# Patient Record
Sex: Male | Born: 1945 | Hispanic: Yes | Marital: Single | Smoking: Never smoker
Health system: Southern US, Community
[De-identification: ages and names within clinical notes are randomized; demographics above are authoritative.]

## PROBLEM LIST (undated history)

## (undated) DIAGNOSIS — I10 Essential (primary) hypertension: Secondary | ICD-10-CM

## (undated) DIAGNOSIS — I671 Cerebral aneurysm, nonruptured: Secondary | ICD-10-CM

## (undated) DIAGNOSIS — E119 Type 2 diabetes mellitus without complications: Secondary | ICD-10-CM

## (undated) HISTORY — PX: CEREBRAL ANEURYSM REPAIR: SHX164

---

## 2015-09-22 ENCOUNTER — Observation Stay
Admit: 2015-09-22 | Discharge: 2015-09-22 | Disposition: A | Discharge status: Home / Self Care | Payer: Medicare Other | Attending: Internal Medicine | Admitting: Internal Medicine

## 2015-09-22 ENCOUNTER — Encounter: Payer: Self-pay | Admitting: Emergency Medicine

## 2015-09-22 ENCOUNTER — Observation Stay
Admission: EM | Admit: 2015-09-22 | Discharge: 2015-09-23 | Disposition: A | Discharge status: Home / Self Care | Payer: Medicare Other | Attending: Internal Medicine | Admitting: Internal Medicine

## 2015-09-22 ENCOUNTER — Emergency Department: Payer: Medicare Other

## 2015-09-22 DIAGNOSIS — I160 Hypertensive urgency: Secondary | ICD-10-CM

## 2015-09-22 DIAGNOSIS — I340 Nonrheumatic mitral (valve) insufficiency: Secondary | ICD-10-CM | POA: Insufficient documentation

## 2015-09-22 DIAGNOSIS — E1165 Type 2 diabetes mellitus with hyperglycemia: Secondary | ICD-10-CM | POA: Insufficient documentation

## 2015-09-22 DIAGNOSIS — I10 Essential (primary) hypertension: Principal | ICD-10-CM | POA: Insufficient documentation

## 2015-09-22 DIAGNOSIS — R778 Other specified abnormalities of plasma proteins: Secondary | ICD-10-CM | POA: Diagnosis present

## 2015-09-22 DIAGNOSIS — R7989 Other specified abnormal findings of blood chemistry: Secondary | ICD-10-CM

## 2015-09-22 DIAGNOSIS — H539 Unspecified visual disturbance: Secondary | ICD-10-CM

## 2015-09-22 DIAGNOSIS — R011 Cardiac murmur, unspecified: Secondary | ICD-10-CM | POA: Insufficient documentation

## 2015-09-22 DIAGNOSIS — I671 Cerebral aneurysm, nonruptured: Secondary | ICD-10-CM | POA: Insufficient documentation

## 2015-09-22 DIAGNOSIS — Z833 Family history of diabetes mellitus: Secondary | ICD-10-CM | POA: Insufficient documentation

## 2015-09-22 DIAGNOSIS — E119 Type 2 diabetes mellitus without complications: Secondary | ICD-10-CM

## 2015-09-22 DIAGNOSIS — R748 Abnormal levels of other serum enzymes: Secondary | ICD-10-CM | POA: Insufficient documentation

## 2015-09-22 DIAGNOSIS — Z9889 Other specified postprocedural states: Secondary | ICD-10-CM | POA: Insufficient documentation

## 2015-09-22 DIAGNOSIS — I739 Peripheral vascular disease, unspecified: Secondary | ICD-10-CM | POA: Insufficient documentation

## 2015-09-22 DIAGNOSIS — R51 Headache: Secondary | ICD-10-CM | POA: Insufficient documentation

## 2015-09-22 DIAGNOSIS — Z87891 Personal history of nicotine dependence: Secondary | ICD-10-CM | POA: Insufficient documentation

## 2015-09-22 DIAGNOSIS — R251 Tremor, unspecified: Secondary | ICD-10-CM | POA: Insufficient documentation

## 2015-09-22 DIAGNOSIS — R001 Bradycardia, unspecified: Secondary | ICD-10-CM | POA: Insufficient documentation

## 2015-09-22 HISTORY — DX: Cerebral aneurysm, nonruptured: I67.1

## 2015-09-22 LAB — CBC
HCT: 43.3 % (ref 40.0–52.0)
Hemoglobin: 14.7 g/dL (ref 13.0–18.0)
MCH: 29.1 pg (ref 26.0–34.0)
MCHC: 33.8 g/dL (ref 32.0–36.0)
MCV: 86.1 fL (ref 80.0–100.0)
PLATELETS: 182 10*3/uL (ref 150–440)
RBC: 5.04 MIL/uL (ref 4.40–5.90)
RDW: 13 % (ref 11.5–14.5)
WBC: 5.6 10*3/uL (ref 3.8–10.6)

## 2015-09-22 LAB — URINALYSIS COMPLETE WITH MICROSCOPIC (ARMC ONLY)
BILIRUBIN URINE: NEGATIVE
HGB URINE DIPSTICK: NEGATIVE
Ketones, ur: NEGATIVE mg/dL
LEUKOCYTES UA: NEGATIVE
Nitrite: NEGATIVE
Protein, ur: 30 mg/dL — AB
Specific Gravity, Urine: 1.026 (ref 1.005–1.030)
pH: 5 (ref 5.0–8.0)

## 2015-09-22 LAB — BASIC METABOLIC PANEL
ANION GAP: 8 (ref 5–15)
BUN: 17 mg/dL (ref 6–20)
CALCIUM: 10.1 mg/dL (ref 8.9–10.3)
CO2: 28 mmol/L (ref 22–32)
Chloride: 101 mmol/L (ref 101–111)
Creatinine, Ser: 0.85 mg/dL (ref 0.61–1.24)
GFR calc Af Amer: >60 mL/min (ref >60)
GLUCOSE: 352 mg/dL — AB (ref 65–99)
POTASSIUM: 4.4 mmol/L (ref 3.5–5.1)
SODIUM: 137 mmol/L (ref 135–145)

## 2015-09-22 LAB — GLUCOSE, CAPILLARY
Glucose-Capillary: 420 mg/dL — ABNORMAL HIGH (ref 65–99)
Glucose-Capillary: 214 mg/dL — ABNORMAL HIGH (ref 65–99)

## 2015-09-22 LAB — TROPONIN I
TROPONIN I: 0.05 ng/mL — AB (ref <0.031)
Troponin I: <0.03 ng/mL (ref <0.031)

## 2015-09-22 LAB — SEDIMENTATION RATE: SED RATE: 25 mm/h — AB (ref 0–20)

## 2015-09-22 MED ORDER — AMLODIPINE BESYLATE 10 MG PO TABS
10.0000 mg | ORAL_TABLET | Freq: Every day | ORAL | Status: DC
Start: 2015-09-22 — End: 2015-09-23
  Administered 2015-09-22 – 2015-09-23 (×2): 10 mg via ORAL
  Filled 2015-09-22 (×2): qty 1

## 2015-09-22 MED ORDER — ONDANSETRON HCL 4 MG PO TABS: 4.0000 mg | ORAL_TABLET | Freq: Four times a day (QID) | ORAL | Status: DC | PRN

## 2015-09-22 MED ORDER — INSULIN ASPART 100 UNIT/ML ~~LOC~~ SOLN
15.0000 [IU] | Freq: Once | SUBCUTANEOUS | Status: AC
  Administered 2015-09-22: 15 [IU] via SUBCUTANEOUS
  Filled 2015-09-22: qty 15

## 2015-09-22 MED ORDER — SODIUM CHLORIDE 0.9% FLUSH
3.0000 mL | Freq: Two times a day (BID) | INTRAVENOUS | Status: DC
  Administered 2015-09-23: 3 mL via INTRAVENOUS

## 2015-09-22 MED ORDER — INSULIN ASPART 100 UNIT/ML ~~LOC~~ SOLN: 0.0000 [IU] | Freq: Every day | SUBCUTANEOUS | Status: DC

## 2015-09-22 MED ORDER — INSULIN GLARGINE 100 UNIT/ML ~~LOC~~ SOLN
10.0000 [IU] | Freq: Every day | SUBCUTANEOUS | Status: DC
  Administered 2015-09-22: 10 [IU] via SUBCUTANEOUS
  Filled 2015-09-22 (×2): qty 0.1

## 2015-09-22 MED ORDER — ALBUTEROL SULFATE (2.5 MG/3ML) 0.083% IN NEBU: 2.5000 mg | INHALATION_SOLUTION | RESPIRATORY_TRACT | Status: DC | PRN

## 2015-09-22 MED ORDER — ONDANSETRON HCL 4 MG/2ML IJ SOLN: 4.0000 mg | Freq: Four times a day (QID) | INTRAMUSCULAR | Status: DC | PRN

## 2015-09-22 MED ORDER — ACETAMINOPHEN 650 MG RE SUPP: 650.0000 mg | Freq: Four times a day (QID) | RECTAL | Status: DC | PRN

## 2015-09-22 MED ORDER — ENOXAPARIN SODIUM 40 MG/0.4ML ~~LOC~~ SOLN
40.0000 mg | SUBCUTANEOUS | Status: DC
  Administered 2015-09-22: 40 mg via SUBCUTANEOUS
  Filled 2015-09-22: qty 0.4

## 2015-09-22 MED ORDER — ATORVASTATIN CALCIUM 10 MG PO TABS
40.0000 mg | ORAL_TABLET | Freq: Every day | ORAL | Status: DC
  Administered 2015-09-22: 40 mg via ORAL
  Filled 2015-09-22: qty 4

## 2015-09-22 MED ORDER — SODIUM CHLORIDE 0.9 % IV SOLN: 250.0000 mL | INTRAVENOUS | Status: DC | PRN

## 2015-09-22 MED ORDER — HYDRALAZINE HCL 20 MG/ML IJ SOLN: 10.0000 mg | Freq: Four times a day (QID) | INTRAMUSCULAR | Status: DC | PRN

## 2015-09-22 MED ORDER — INSULIN ASPART 100 UNIT/ML ~~LOC~~ SOLN
0.0000 [IU] | Freq: Three times a day (TID) | SUBCUTANEOUS | Status: DC
  Administered 2015-09-23: 5 [IU] via SUBCUTANEOUS
  Filled 2015-09-22: qty 5

## 2015-09-22 MED ORDER — SODIUM CHLORIDE 0.9% FLUSH: 3.0000 mL | INTRAVENOUS | Status: DC | PRN

## 2015-09-22 MED ORDER — SODIUM CHLORIDE 0.9% FLUSH
3.0000 mL | Freq: Two times a day (BID) | INTRAVENOUS | Status: DC
  Administered 2015-09-22: 3 mL via INTRAVENOUS

## 2015-09-22 MED ORDER — ASPIRIN EC 325 MG PO TBEC
325.0000 mg | DELAYED_RELEASE_TABLET | Freq: Every day | ORAL | Status: DC
  Administered 2015-09-22 – 2015-09-23 (×2): 325 mg via ORAL
  Filled 2015-09-22 (×2): qty 1

## 2015-09-22 MED ORDER — INSULIN ASPART 100 UNIT/ML ~~LOC~~ SOLN
0.0000 [IU] | Freq: Every day | SUBCUTANEOUS | Status: DC
  Administered 2015-09-22: 2 [IU] via SUBCUTANEOUS
  Filled 2015-09-22: qty 2

## 2015-09-22 MED ORDER — ACETAMINOPHEN 325 MG PO TABS
650.0000 mg | ORAL_TABLET | Freq: Four times a day (QID) | ORAL | Status: DC | PRN
Start: 2015-09-22 — End: 2015-09-23

## 2015-09-22 MED ORDER — HYDRALAZINE HCL 20 MG/ML IJ SOLN
10.0000 mg | Freq: Once | INTRAMUSCULAR | Status: AC
  Administered 2015-09-22: 10 mg via INTRAVENOUS
  Filled 2015-09-22: qty 1

## 2015-09-22 MED ORDER — INSULIN ASPART 100 UNIT/ML ~~LOC~~ SOLN: 0.0000 [IU] | Freq: Three times a day (TID) | SUBCUTANEOUS | Status: DC

## 2015-09-22 NOTE — Progress Notes (Signed)
Patient fs 420 mg/dl on admission to the floor , DR Imogene Burn was made aware and order novolog 15 unit sq to be given ,  Also sbp slightly high , schedule meds given as order ,will continue to monitor

## 2015-09-22 NOTE — ED Notes (Signed)
Patient states he does not take any medications for BP and has not been to a physician in 20 years.

## 2015-09-22 NOTE — ED Notes (Signed)
States has seen light flashes x 1 week. States at onset had headache x 1 day. Denies history of migraines. 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

## 2015-09-22 NOTE — H&P (Addendum)
[facility] [place] at [facility]   PATIENT NAME: Kyle Ritter    MR#:  [id number]  DATE OF BIRTH:  July 20, 1946  DATE OF ADMISSION:  09/22/2015  PRIMARY CARE PHYSICIAN: No PCP Per Patient   REQUESTING/REFERRING PHYSICIAN: Rockne Menghini, MD  CHIEF COMPLAINT:   Chief Complaint  Patient presents with  . Eye Problem   headache and "blanking light" a few days.  HISTORY OF PRESENT ILLNESS:  Conrad Zajkowski  is a 70 y.o. male with a known history of brain aneurysm. Patient came to the ED due to above chief complaint. He has had frontal headacheand "blanking light" a few days, but he denies any dizziness, numbness or weakness. He denies any slurred speech or dysphagia or incontinence. He was found to have high blood pressure at 232/78,  treated with hydralazine IV 1 dose. His symptoms has much improved after treatment of hypertension. CAT scan of the head didn't show any hemorrhage. But he has elevated troponin at 0.05. Blood sugar is high at 352. Dr. Sharma Covert has concerns for discharging the patient. She insists that the patient needs to be admitted for further evaluation. The patient hasn't seen any physician for more than 20 years and he is not taking any medication.  PAST MEDICAL HISTORY:   Past Medical History  Diagnosis Date  . Brain aneurysm     PAST SURGICAL HISTORY:   Past Surgical History  Procedure Laterality Date  . Cerebral aneurysm repair      SOCIAL HISTORY:   Social History  Substance Use Topics  . Smoking status: Former Games developer  . Smokeless tobacco: Not on file  . Alcohol Use: No     Comment: quit alcohol 4 years ago.    FAMILY HISTORY:   Family History  Problem Relation Age of Onset  . Diabetes Mother   . Diabetes Sister     DRUG ALLERGIES:  No Known Allergies  REVIEW OF SYSTEMS:  CONSTITUTIONAL: No fever, fatigue or weakness.  EYES: No blurred or double vision. But has  "blanking light". EARS, NOSE, AND THROAT: No  tinnitus or ear pain.  RESPIRATORY: No cough, shortness of breath, wheezing or hemoptysis.  CARDIOVASCULAR: No chest pain, orthopnea, edema.  GASTROINTESTINAL: No nausea, vomiting, diarrhea or abdominal pain.  GENITOURINARY: No dysuria, hematuria.  ENDOCRINE: No polyuria, nocturia,  HEMATOLOGY: No anemia, easy bruising or bleeding SKIN: No rash or lesion. MUSCULOSKELETAL: No joint pain or arthritis.   NEUROLOGIC: No tingling, numbness, weakness.  PSYCHIATRY: No anxiety or depression.   MEDICATIONS AT HOME:   Prior to Admission medications   Not on File      VITAL SIGNS:  Blood pressure 171/80, pulse 57, temperature 98.4 F (36.9 C), temperature source Oral, resp. rate 19, height  (1.803 m), weight 85.276 kg (188 lb), SpO2 97 %.  PHYSICAL EXAMINATION:  GENERAL:  70 y.o.-year-old patient lying in the bed with no acute distress.  EYES: Pupils equal, round, reactive to light and accommodation. No scleral icterus. Extraocular muscles intact.  HEENT: Head atraumatic, normocephalic. Oropharynx and nasopharynx clear.  NECK:  Supple, no jugular venous distention. No thyroid enlargement, no tenderness.  LUNGS: Normal breath sounds bilaterally, no wheezing, rales,rhonchi or crepitation. No use of accessory muscles of respiration.  CARDIOVASCULAR: S1, S2 normal. Mild systolic murmurs, no rubs, or gallops.  ABDOMEN: Soft, nontender, nondistended. Bowel sounds present. No organomegaly or mass.  EXTREMITIES: No pedal edema, cyanosis, or clubbing.  NEUROLOGIC: Cranial nerves II through XII are intact. Muscle strength  5/5 in all extremities. Sensation intact. Gait not checked.  PSYCHIATRIC: The patient is alert and oriented x 3.  SKIN: No obvious rash, lesion, or ulcer.   LABORATORY PANEL:   CBC  Recent Labs Lab 09/22/15 1145  WBC 5.6  HGB 14.7  HCT 43.3  PLT 182    ------------------------------------------------------------------------------------------------------------------  Chemistries   Recent Labs Lab 09/22/15 1145  NA 137  K 4.4  CL 101  CO2 28  GLUCOSE 352*  BUN 17  CREATININE 0.85  CALCIUM 10.1   ------------------------------------------------------------------------------------------------------------------  Cardiac Enzymes  Recent Labs Lab 09/22/15 1145  TROPONINI 0.05*   ------------------------------------------------------------------------------------------------------------------  RADIOLOGY:  Dg Chest 2 View  09/22/2015  CLINICAL DATA:  Seeing light flashes for 1 week, onset of headache 1 day ago, history intracranial aneurysm post repair 20 yrs ago, former smoker EXAM: CHEST  2 VIEW COMPARISON:  None FINDINGS: Mild enlargement of cardiac silhouette. Mediastinal contours and pulmonary vascularity normal. Lungs clear. No pleural effusion or pneumothorax. Bones unremarkable. Question thoracic ankylosis. IMPRESSION: Enlargement of cardiac silhouette without acute infiltrate. Electronically Signed   By: Ulyses Southward M.D.   On: 09/22/2015 13:13   Ct Head Wo Contrast  09/22/2015  CLINICAL DATA:  Visual alteration.  Tremor.  Hypertension. EXAM: CT HEAD WITHOUT CONTRAST TECHNIQUE: Contiguous axial images were obtained from the base of the skull through the vertex without intravenous contrast. COMPARISON:  None. FINDINGS: There is mild diffuse atrophy. There is a cavum septum pellucidum, an anatomic variant. There is no intracranial mass hemorrhage, extra-axial fluid collection, or midline shift. There is evidence of a prior infarct at the gray - white junction of the mid left frontal lobe. There is patchy small vessel disease in the centra semiovale bilaterally. Elsewhere gray-white compartments are normal. No acute infarct is evident. There is postoperative change in the left frontal and temporal bone regions. Bony calvarium  otherwise intact. Mastoid air cells are clear. No intraorbital lesions are identified. 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 No hemorrhage or acute infarct. Postoperative change left frontal and temporal bones. Electronically Signed   By: Bretta Bang III M.D.   On: 09/22/2015 13:09    EKG:   Orders placed or performed during the hospital encounter of 09/22/15  . ED EKG  . ED EKG  . EKG 12-Lead  . EKG 12-Lead  . EKG 12-Lead  . EKG 12-Lead  . EKG 12-Lead  . EKG 12-Lead    IMPRESSION AND PLAN:   Malignant hypertension I will start hydralazine IV when necessary and Norvasc by mouth, adjust hypertension medication depending on patient's blood pressure.  Elevated troponin. Possible due to malignant hypertension. Follow-up troponin level, start aspirin and Lipitor, check lipid panel and TSH. Follow-up echocardiogram and cardiology consult.  Bradycardia. Telemetry monitor. New diagnosis of diabetes with hyperglycemia.  Check hemoglobin A1c , start Lantus and sliding scale.    All the records are reviewed and case discussed with ED provider. Management plans discussed with the patient, family and they are in agreement.  CODE STATUS: Full code  TOTAL TIME TAKING CARE OF THIS PATIENT: 51 minutes.    Shaune Pollack M.D on 09/22/2015 at 3:41 PM  Between 7am to 6pm - Pager - [phone]  After 6pm go to [email or web address] - password [email or web address] [facility]  [facility]  Office  [phone]  CC: Primary care physician; No PCP Per Patient

## 2015-09-22 NOTE — ED Notes (Signed)
Disregard "ED EKG Completed" and "Complete ED EKG Completed" tasks that were documented at 1251.  Documented in error, wrong patient.  Patient at CT, will complete when patient returns.

## 2015-09-22 NOTE — Progress Notes (Signed)
*  PRELIMINARY RESULTS* Echocardiogram 2D Echocardiogram has been performed.  Kyle Ritter 09/22/2015, 7:31 PM

## 2015-09-22 NOTE — ED Provider Notes (Signed)
[facility] Emergency Department Provider Note  ____________________________________________  Time seen: Approximately 12:37 PM  I have reviewed the triage vital signs and the nursing notes.   HISTORY  Chief Complaint Eye Problem    HPI Kyle Ritter is a 70 y.o. male w/ remote cerebral aneurysm repair presenting for headache and "blinking lights."  Patient reports that for 1 day last week he had a mild right frontal headache without any associated symptoms including visual changes, numbness tingling or weakness, fever or neck stiffness, nausea or vomiting. It resolved spontaneously. Several days later he began to develop "blinking lights" regardless of which direction he would look in. Sometimes this would last as long as 2 hours, but more recently he episodes have been brief. Overall, both of his symptoms have been improving. He denies any trauma, chronic medications, difficulty walking, changes in mental status, or any other recent illness.   Past Medical History  Diagnosis Date  . Brain aneurysm     There are no active problems to display for this patient.   Past Surgical History  Procedure Laterality Date  . Cerebral aneurysm repair      No current outpatient prescriptions on file.  Allergies Review of patient's allergies indicates no known allergies.  No family history on file.  Social History Social History  Substance Use Topics  . Smoking status: Former Games developer  . Smokeless tobacco: None  . Alcohol Use: No    Review of Systems Constitutional: No fever/chills. No lightheadedness or syncope. Eyes: Positive "blinking lights"without blurred vision or double vision. ENT: No sore throat. Cardiovascular: Denies chest pain, palpitations. Respiratory: Denies shortness of breath.  No cough. Gastrointestinal: No abdominal pain.  No nausea, no vomiting.  No diarrhea.  No constipation. Genitourinary: Negative for dysuria. Musculoskeletal:  Negative for back pain. Skin: Negative for rash. Neurological: Positive for mild headache, now resolved. No numbness tingling or weakness. No changes in speech. No changes in mental status. No changes in gait. Positive resting tremor most prominent in the head which is chronic.  10-point ROS otherwise negative.  ____________________________________________   PHYSICAL EXAM:  VITAL SIGNS: ED Triage Vitals  Enc Vitals Group     BP 09/22/15 1118 232/78 mmHg     Pulse Rate 09/22/15 1118 60     Resp 09/22/15 1118 18     Temp 09/22/15 1118 98.4 F (36.9 C)     Temp Source 09/22/15 1118 Oral     SpO2 09/22/15 1118 96 %     Weight 09/22/15 1118 188 lb (85.276 kg)     Height 09/22/15 1118 5\' 11"  (1.803 m)     Head Cir --      Peak Flow --      Pain Score --      Pain Loc --      Pain Edu? --      Excl. in GC? --     Constitutional: Alert and oriented. Well appearing and in no acute distress. Answer question appropriately. Eyes: Conjunctivae are normal.  EOMI. PERRLA. Head: Atraumatic. Nose: No congestion/rhinnorhea. Mouth/Throat: Mucous membranes are moist.  Neck: No stridor.  Supple.  No JVD. Cardiovascular: Normal rate, regular rhythm. Positive holosystolic murmur without rubs or gallops.  Respiratory: Normal respiratory effort.  No retractions. Lungs CTAB.  No wheezes, rales or ronchi. Gastrointestinal: Soft and nontender. No distention. No peritoneal signs. Musculoskeletal: No LE edema.  Neurologic:  Neurologic: Alert and oriented 3. Speech is clear. Naming and repetition are intact. Face and  smile symmetric. EOMI and PERRLA. Tongue is midline. No pronator drift. 5 out of 5 grip, biceps, triceps, hip flexors, plantar flexion and dorsiflexion. Normal sensation to light touch in the bilateral upper and lower extremities, and face. Normal heel-to-shin. Resting tremor that is most prominent in the head but also present in both arms. Skin:  Skin is warm, dry and intact. No rash  noted. Psychiatric: Mood and affect are normal. Speech and behavior are normal.  Normal judgement.  ____________________________________________   LABS (all labs ordered are listed, but only abnormal results are displayed)  Labs Reviewed  BASIC METABOLIC PANEL - Abnormal; Notable for the following:    Glucose, Bld 352 (*)    All other components within normal limits  TROPONIN I - Abnormal; Notable for the following:    Troponin I 0.05 (*)    All other components within normal limits  URINALYSIS COMPLETEWITH MICROSCOPIC ([facility] ONLY) - Abnormal; Notable for the following:    Color, Urine YELLOW (*)    APPearance CLEAR (*)    Glucose, UA >500 (*)    Protein, ur 30 (*)    Bacteria, UA RARE (*)    Squamous Epithelial / LPF 0-5 (*)    All other components within normal limits  SEDIMENTATION RATE - Abnormal; Notable for the following:    Sed Rate 25 (*)    All other components within normal limits  CBC   ____________________________________________  EKG  ED ECG REPORT I, Rockne Menghini, the attending physician, personally viewed and interpreted this ECG.   Date: 09/22/2015  EKG Time: 1306  Rate: 53  Rhythm: sinus bradycardia  Axis: Leftward  Intervals:none  ST&T Change: No ST elevation. Nonspecific T-wave inversion in V1. No ischemic changes.  ____________________________________________  RADIOLOGY  Dg Chest 2 View  09/22/2015  CLINICAL DATA:  Seeing light flashes for 1 week, onset of headache 1 day ago, history intracranial aneurysm post repair 20 yrs ago, former smoker EXAM: CHEST  2 VIEW COMPARISON:  None FINDINGS: Mild enlargement of cardiac silhouette. Mediastinal contours and pulmonary vascularity normal. Lungs clear. No pleural effusion or pneumothorax. Bones unremarkable. Question thoracic ankylosis. IMPRESSION: Enlargement of cardiac silhouette without acute infiltrate. Electronically Signed   By: Ulyses Southward M.D.   On: 09/22/2015 13:13   Ct Head Wo  Contrast  09/22/2015  CLINICAL DATA:  Visual alteration.  Tremor.  Hypertension. EXAM: CT HEAD WITHOUT CONTRAST TECHNIQUE: Contiguous axial images were obtained from the base of the skull through the vertex without intravenous contrast. COMPARISON:  None. FINDINGS: There is mild diffuse atrophy. There is a cavum septum pellucidum, an anatomic variant. There is no intracranial mass hemorrhage, extra-axial fluid collection, or midline shift. There is evidence of a prior infarct at the gray - white junction of the mid left frontal lobe. There is patchy small vessel disease in the centra semiovale bilaterally. Elsewhere gray-white compartments are normal. No acute infarct is evident. There is postoperative change in the left frontal and temporal bone regions. Bony calvarium otherwise intact. Mastoid air cells are clear. No intraorbital lesions are identified. 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 No hemorrhage or acute infarct. Postoperative change left frontal and temporal bones. Electronically Signed   By: Bretta Bang III M.D.   On: 09/22/2015 13:09    ____________________________________________   PROCEDURES  Procedure(s) performed: None  Critical Care performed: No ____________________________________________   INITIAL IMPRESSION / ASSESSMENT AND PLAN / ED COURSE  Pertinent labs &  imaging results that were available during my care of the patient were reviewed by me and considered in my medical decision making (see chart for details).  70 y.o. male with a history of cerebral aneurysm presenting with headache and "blinking lights", both of which are improving. The patient is markedly hypertensive with a blood pressure in the 220s over 100s on my exam. He also has a bradycardia in the high 50s. He has no focal neurologic findings other than what is likely a chronic resting tremor or possible parkinsonian tremor. His  headache and visual changes may be symptomatic hypertension or hypertensive urgency. I will also evaluate him for CVA and head bleed. It is much less likely that these symptoms are due to subarachnoid hemorrhage or repeat problem with his cerebral aneurysm given the minimal pain in his head and lack of neurologic symptoms. Also get a sedimentation rate to evaluate for temporal arteritis. The patient will likely require admission to the hospital for further evaluation and treatment.  ----------------------------------------- 3:06 PM on 09/22/2015 -----------------------------------------  The patient does have hyperglycemia without DKA. He does not carry a diagnosis of diabetes, but this is likely new diagnosis for him. His hypertension has improved with hydralazine and blood pressure is now 150s over 70s. His CT scan does not show any acute strokes but he does have evidence of prior stroke. His chest x-ray does show an enlarged cardiac silhouette, and this in conjunction with the holosystolic murmur that I heard on my exam will need further evaluation with echocardiogram. He does have a mildly elevated troponin and this will need to be trended. The patient's sedimentation rate is only 25, which is unlikely a sign of temporal arteritis. I would normally treat him with a sedimentation rate starting at 50. Plan admission.  ____________________________________________  FINAL CLINICAL IMPRESSION(S) / ED DIAGNOSES  Final diagnoses:  New onset type 2 diabetes mellitus (HCC)  Elevated troponin  Visual changes  Hypertensive urgency, malignant  Acute nonintractable headache, unspecified headache type      NEW MEDICATIONS STARTED DURING THIS VISIT:  New Prescriptions   No medications on file     Rockne Menghini, MD 09/22/15 [phone]

## 2015-09-23 LAB — HEMOGLOBIN A1C: Hgb A1c MFr Bld: 10.6 % — ABNORMAL HIGH (ref 4.0–6.0)

## 2015-09-23 LAB — TSH: TSH: 1.986 u[IU]/mL (ref 0.350–4.500)

## 2015-09-23 LAB — CBC
HCT: 39.8 % — ABNORMAL LOW (ref 40.0–52.0)
HEMOGLOBIN: 13.7 g/dL (ref 13.0–18.0)
MCH: 29.8 pg (ref 26.0–34.0)
MCHC: 34.4 g/dL (ref 32.0–36.0)
MCV: 86.7 fL (ref 80.0–100.0)
PLATELETS: 177 10*3/uL (ref 150–440)
RBC: 4.59 MIL/uL (ref 4.40–5.90)
RDW: 12.8 % (ref 11.5–14.5)
WBC: 7.2 10*3/uL (ref 3.8–10.6)

## 2015-09-23 LAB — GLUCOSE, CAPILLARY: GLUCOSE-CAPILLARY: 247 mg/dL — AB (ref 65–99)

## 2015-09-23 LAB — LIPID PANEL
CHOL/HDL RATIO: 4.8 ratio
Cholesterol: 209 mg/dL — ABNORMAL HIGH (ref 0–200)
HDL: 44 mg/dL (ref >40)
LDL CALC: 133 mg/dL — AB (ref 0–99)
TRIGLYCERIDES: 162 mg/dL — AB (ref <150)
VLDL: 32 mg/dL (ref 0–40)

## 2015-09-23 LAB — TROPONIN I
TROPONIN I: 0.03 ng/mL (ref <0.031)
Troponin I: 0.03 ng/mL (ref <0.031)

## 2015-09-23 MED ORDER — METFORMIN HCL 500 MG PO TABS
500.0000 mg | ORAL_TABLET | Freq: Two times a day (BID) | ORAL | Status: DC
Start: 2015-09-23 — End: 2019-11-27

## 2015-09-23 MED ORDER — ASPIRIN 81 MG PO TBEC: 81.0000 mg | DELAYED_RELEASE_TABLET | Freq: Every day | ORAL | Status: DC

## 2015-09-23 MED ORDER — ATORVASTATIN CALCIUM 40 MG PO TABS: 40.0000 mg | ORAL_TABLET | Freq: Every day | ORAL | Status: DC

## 2015-09-23 MED ORDER — AMLODIPINE BESYLATE 10 MG PO TABS: 10.0000 mg | ORAL_TABLET | Freq: Every day | ORAL | Status: DC

## 2015-09-23 MED ORDER — LIVING WELL WITH DIABETES BOOK
Freq: Once | Status: AC
  Administered 2015-09-23: 09:00:00
  Filled 2015-09-23: qty 1

## 2015-09-23 NOTE — Discharge Instructions (Signed)

## 2015-09-23 NOTE — Progress Notes (Signed)
Discharge instructions explained to pt and pts family member/ verbalized an understanding/ iv and tele removed/ transported off unit via wheelchair.  

## 2015-09-23 NOTE — Care Management (Signed)
Patient placed in observation for elevated blood pressure and elevated troponin.  Patient has not seen a physcian in over 20 years.  Provided list of local physicians in area accepting new patients.  Sister s followed at [facility] and will make patient appointment.  [facility] is also to be followed at [facility] as [facility] outpatient for new onsert DM

## 2015-09-23 NOTE — Progress Notes (Signed)
Kyle Ritter is a 70 y.o. male  [id number]  Primary Cardiologist: Adrian Blackwater Reason for Consultation: Headache  HPI: This is a 70 year old white male with a past medical history of berry aneurysm of the brain apparently was having some headaches and blinking lights on Wednesday last week and presented yesterday to the hospital because somebody from his family wanted him to be checked out. He denies any chest pain or shortness of breath. However his blood sugar was elevated and one of the troponin was 0.05 the other 2 troponins were negative. He denies any chest pain or shortness of breath.    Review of Systems: No chest pain orthopnea PND palpitation or leg swelling and CT of the brain was unremarkable   Past Medical History  Diagnosis Date  . Brain aneurysm     No prescriptions prior to admission     . amLODipine  10 mg Oral Daily  . aspirin EC  325 mg Oral Daily  . atorvastatin  40 mg Oral q1800  . enoxaparin (LOVENOX) injection  40 mg Subcutaneous Q24H  . insulin aspart  0-15 Units Subcutaneous TID WC  . insulin aspart  0-5 Units Subcutaneous QHS  . insulin glargine  10 Units Subcutaneous QHS  . living well with diabetes book   Does not apply Once  . sodium chloride flush  3 mL Intravenous Q12H  . sodium chloride flush  3 mL Intravenous Q12H    Infusions:    No Known Allergies  Social History   Social History  . Marital Status: Single    Spouse Name: N/A  . Number of Children: N/A  . Years of Education: N/A   Occupational History  . Not on file.   Social History Main Topics  . Smoking status: Former Games developer  . Smokeless tobacco: Not on file  . Alcohol Use: No     Comment: quit alcohol 4 years ago.  . Drug Use: Not on file  . Sexual Activity: Not on file   Other Topics Concern  . Not on file   Social History Narrative  . No narrative on file    Family History  Problem Relation Age of Onset  . Diabetes Mother   . Diabetes Sister      PHYSICAL EXAM: Filed Vitals:   09/23/15 0411 09/23/15 0725  BP: 132/67 143/70  Pulse: 52 56  Temp: 97.8 F (36.6 C)   Resp: 17 18     Intake/Output Summary (Last 24 hours) at 09/23/15 0910 Last data filed at 09/23/15 0651  Gross per 24 hour  Intake      0 ml  Output      0 ml  Net      0 ml    General:  Well appearing. No respiratory difficulty HEENT: normal Neck: supple. no JVD. Carotids 2+ bilat; no bruits. No lymphadenopathy or thryomegaly appreciated. Cor: PMI nondisplaced. Regular rate & rhythm. No rubs, gallops or murmurs. Lungs: clear Abdomen: soft, nontender, nondistended. No hepatosplenomegaly. No bruits or masses. Good bowel sounds. Extremities: no cyanosis, clubbing, rash, edema Neuro: alert & oriented x 3, cranial nerves grossly intact. moves all 4 extremities w/o difficulty. Affect pleasant.  ECG: Sinus rhythm with sinus bradycardia and nonspecific ST-T changes  Results for orders placed or performed during the hospital encounter of 09/22/15 (from the past 24 hour(s))  CBC     Status: None   Collection Time: 09/22/15 11:45 AM  Result Value Ref Range   WBC 5.6  3.8 - 10.6 K/uL   RBC 5.04 4.40 - 5.90 MIL/uL   Hemoglobin 14.7 13.0 - 18.0 g/dL   HCT 16.1 09.6 - 04.5 %   MCV 86.1 80.0 - 100.0 fL   MCH 29.1 26.0 - 34.0 pg   MCHC 33.8 32.0 - 36.0 g/dL   RDW 40.9 81.1 - 91.4 %   Platelets 182 150 - 440 K/uL  Basic metabolic panel     Status: Abnormal   Collection Time: 09/22/15 11:45 AM  Result Value Ref Range   Sodium 137 135 - 145 mmol/L   Potassium 4.4 3.5 - 5.1 mmol/L   Chloride 101 101 - 111 mmol/L   CO2 28 22 - 32 mmol/L   Glucose, Bld 352 (H) 65 - 99 mg/dL   BUN 17 6 - 20 mg/dL   Creatinine, Ser 7.82 0.61 - 1.24 mg/dL   Calcium 95.6 8.9 - 21.3 mg/dL   GFR calc non Af Amer >60 >60 mL/min   GFR calc Af Amer >60 >60 mL/min   Anion gap 8 5 - 15  Troponin I     Status: Abnormal   Collection Time: 09/22/15 11:45 AM  Result Value Ref Range    Troponin I 0.05 (H) <0.031 ng/mL  Sedimentation rate     Status: Abnormal   Collection Time: 09/22/15 11:45 AM  Result Value Ref Range   Sed Rate 25 (H) 0 - 20 mm/hr  Urinalysis complete, with microscopic ([facility] only)     Status: Abnormal   Collection Time: 09/22/15  1:14 PM  Result Value Ref Range   Color, Urine YELLOW (A) YELLOW   APPearance CLEAR (A) CLEAR   Glucose, UA >500 (A) NEGATIVE mg/dL   Bilirubin Urine NEGATIVE NEGATIVE   Ketones, ur NEGATIVE NEGATIVE mg/dL   Specific Gravity, Urine 1.026 1.005 - 1.030   Hgb urine dipstick NEGATIVE NEGATIVE   pH 5.0 5.0 - 8.0   Protein, ur 30 (A) NEGATIVE mg/dL   Nitrite NEGATIVE NEGATIVE   Leukocytes, UA NEGATIVE NEGATIVE   RBC / HPF 0-5 0 - 5 RBC/hpf   WBC, UA 0-5 0 - 5 WBC/hpf   Bacteria, UA RARE (A) NONE SEEN   Squamous Epithelial / LPF 0-5 (A) NONE SEEN   Mucous PRESENT   Troponin I     Status: None   Collection Time: 09/22/15  5:38 PM  Result Value Ref Range   Troponin I <0.03 <0.031 ng/mL  Glucose, capillary     Status: Abnormal   Collection Time: 09/22/15  5:42 PM  Result Value Ref Range   Glucose-Capillary 420 (H) 65 - 99 mg/dL   Comment 1 Notify RN   Glucose, capillary     Status: Abnormal   Collection Time: 09/22/15  8:53 PM  Result Value Ref Range   Glucose-Capillary 214 (H) 65 - 99 mg/dL  Troponin I     Status: None   Collection Time: 09/22/15 11:28 PM  Result Value Ref Range   Troponin I 0.03 <0.031 ng/mL  CBC     Status: Abnormal   Collection Time: 09/23/15  4:31 AM  Result Value Ref Range   WBC 7.2 3.8 - 10.6 K/uL   RBC 4.59 4.40 - 5.90 MIL/uL   Hemoglobin 13.7 13.0 - 18.0 g/dL   HCT 08.6 (L) 57.8 - 46.9 %   MCV 86.7 80.0 - 100.0 fL   MCH 29.8 26.0 - 34.0 pg   MCHC 34.4 32.0 - 36.0 g/dL   RDW 62.9 52.8 - 41.3 %  Platelets 177 150 - 440 K/uL  TSH     Status: None   Collection Time: 09/23/15  4:31 AM  Result Value Ref Range   TSH 1.986 0.350 - 4.500 uIU/mL  Troponin I     Status: None   Collection  Time: 09/23/15  4:31 AM  Result Value Ref Range   Troponin I 0.03 <0.031 ng/mL  Lipid panel     Status: Abnormal   Collection Time: 09/23/15  4:31 AM  Result Value Ref Range   Cholesterol 209 (H) 0 - 200 mg/dL   Triglycerides 782 (H) <150 mg/dL   HDL 44 >95 mg/dL   Total CHOL/HDL Ratio 4.8 RATIO   VLDL 32 0 - 40 mg/dL   LDL Cholesterol 621 (H) 0 - 99 mg/dL  Glucose, capillary     Status: Abnormal   Collection Time: 09/23/15  7:28 AM  Result Value Ref Range   Glucose-Capillary 247 (H) 65 - 99 mg/dL   Comment 1 Notify RN    Dg Chest 2 View  09/22/2015  CLINICAL DATA:  Seeing light flashes for 1 week, onset of headache 1 day ago, history intracranial aneurysm post repair 20 yrs ago, former smoker EXAM: CHEST  2 VIEW COMPARISON:  None FINDINGS: Mild enlargement of cardiac silhouette. Mediastinal contours and pulmonary vascularity normal. Lungs clear. No pleural effusion or pneumothorax. Bones unremarkable. Question thoracic ankylosis. IMPRESSION: Enlargement of cardiac silhouette without acute infiltrate. Electronically Signed   By: Ulyses Southward M.D.   On: 09/22/2015 13:13   Ct Head Wo Contrast  09/22/2015  CLINICAL DATA:  Visual alteration.  Tremor.  Hypertension. EXAM: CT HEAD WITHOUT CONTRAST TECHNIQUE: Contiguous axial images were obtained from the base of the skull through the vertex without intravenous contrast. COMPARISON:  None. FINDINGS: There is mild diffuse atrophy. There is a cavum septum pellucidum, an anatomic variant. There is no intracranial mass hemorrhage, extra-axial fluid collection, or midline shift. There is evidence of a prior infarct at the gray - white junction of the mid left frontal lobe. There is patchy small vessel disease in the centra semiovale bilaterally. Elsewhere gray-white compartments are normal. No acute infarct is evident. There is postoperative change in the left frontal and temporal bone regions. Bony calvarium otherwise intact. Mastoid air cells are clear.  No intraorbital lesions are identified. 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 No hemorrhage or acute infarct. Postoperative change left frontal and temporal bones. Electronically Signed   By: Bretta Bang III M.D.   On: 09/22/2015 13:09     ASSESSMENT AND PLAN: No chest pain with 1 out of 3 troponins being mildly elevated and no significant EKG changes. Patient can go home with follow-up in the office Thursday at 10:00.  Shirleyann Montero A

## 2015-09-23 NOTE — Progress Notes (Signed)
Inpatient Diabetes Program Recommendations  AACE/ADA: New Consensus Statement on Inpatient Glycemic Control (2015)  Target Ranges:  Prepandial:   less than 140 mg/dL      Peak postprandial:   less than 180 mg/dL (1-2 hours)      Critically ill patients:  140 - 180 mg/dL  Results for DATRON, BRAKEBILL (MRN [id number]) as of 09/23/2015 08:50  Ref. Range 09/22/2015 17:42 09/22/2015 20:53 09/23/2015 07:28  Glucose-Capillary Latest Ref Range: 65-99 mg/dL 409 (H) 811 (H) 914 (H)   Review of Glycemic Control  Diabetes history: NO; new DM dx Outpatient Diabetes medications: NA Current orders for Inpatient glycemic control: Lantus 10 units QHS, Novolog 0-15 units TID with meals, Novolog 0-5 units QHS  Inpatient Diabetes Program Recommendations: Insulin - Basal: Please consider increasing Lantus to 15 units QHS. HgbA1C: A1C in process. Discharge plan: MD, please indicate in note as to what the discharge plan will be for DM control (oral DM meds versus insulin) so that patient can be educated during hospitalization.  NOTE: Patient is being newly diagnosed with diabetes. Noted in H&P that patient has not seen a doctor in over 20 years. Ordered: Living Well with Diabetes, diabetes education by bedside RN, RD consult, and CM consult for assistance with setting up follow up/helping patient to establish PCP.  Thanks, Orlando Penner, RN, MSN, CDE Diabetes Coordinator Inpatient Diabetes Program [phone] (Team Pager from 8am to 5pm) [phone] ([facility] office) [phone] [facility] office) [phone] [facility] office)

## 2015-09-25 NOTE — Discharge Summary (Signed)
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 Brain aneurysm     HOSPITAL COURSE:  70 y.o. male with a known history of brain aneurysm admitted due to headache and "blanking light" a few days. He has had frontal headacheand "blanking light" a few days, but he denied any dizziness, numbness or weakness. He was found to have high blood pressure at 232/78 on admission. His symptoms has much improved after treatment of hypertension. CAT scan of the head didn't show any hemorrhage. But he has elevated troponin at 0.05. Blood sugar is high at 352. Please see Dr Nicky Pugh dictated H & P for further details.  Cardio c/s was obtained with Dr Welton Flakes. He was ruled out with serial troponins. He recommended outpt f/up and possible stress test. Patient's symptoms were resolved. He was feeling much better and was D/C home in stable condition. He was agreeable with d/c plans.  DISCHARGE CONDITIONS:   STABLE  CONSULTS OBTAINED:     DRUG ALLERGIES:  No Known Allergies  DISCHARGE MEDICATIONS:   Discharge Medication List as of 09/23/2015 11:47 AM    START taking these medications   Details  amLODipine (NORVASC) 10 MG tablet Take 1 tablet (10 mg total) by mouth daily., Starting 09/23/2015, Until Discontinued, Normal    aspirin EC 81 MG EC tablet Take 1 tablet (81 mg total) by mouth daily., Starting 09/23/2015, Until Discontinued, Normal     atorvastatin (LIPITOR) 40 MG tablet Take 1 tablet (40 mg total) by mouth daily at 6 PM., Starting 09/23/2015, Until Discontinued, Normal    metFORMIN (GLUCOPHAGE) 500 MG tablet Take 1 tablet (500 mg total) by mouth 2 (two) times daily with a meal., Starting 09/23/2015, Until Discontinued, Normal         DISCHARGE INSTRUCTIONS:    DIET:  Cardiac diet  DISCHARGE CONDITION:  Good  ACTIVITY:  Activity as tolerated  OXYGEN:  Home Oxygen: No.   Oxygen Delivery: room air  DISCHARGE LOCATION:  home   If you experience worsening of your admission symptoms, develop shortness of breath, life threatening emergency, suicidal or homicidal thoughts you must seek medical attention immediately by calling 911 or calling your MD immediately  if symptoms less severe.  You Must read complete instructions/literature along with all the possible adverse reactions/side effects for all the Medicines you take and that have been prescribed to you. Take any new Medicines after you have completely understood and accpet all the possible adverse reactions/side effects.   Please note  You were cared for by a hospitalist during your hospital stay. If you have any questions about your discharge medications or the care you received while you were in the hospital after you are discharged, you can call the unit and asked to speak with the hospitalist on call if the hospitalist that took care of you is not available. Once you are discharged, your primary care physician will handle any further medical issues. Please note that  NO REFILLS for any discharge medications will be authorized once you are discharged, as it is imperative that you return to your primary care physician (or establish a relationship with a primary care physician if you do not have one) for your aftercare needs so that they can reassess your need for medications and monitor your lab values.    On the day of Discharge:  VITAL SIGNS:  Blood  pressure 143/70, pulse 56, temperature 97.8 F (36.6 C), temperature source Oral, resp. rate 18, height  (1.803 m), weight 85.276 kg (188 lb), SpO2 97 %.  PHYSICAL EXAMINATION:  GENERAL:  70 y.o.-year-old patient lying in the bed with no acute distress.  EYES: Pupils equal, round, reactive to light and accommodation. No scleral icterus. Extraocular muscles intact.  HEENT: Head atraumatic, normocephalic. Oropharynx and nasopharynx clear.  NECK:  Supple, no jugular venous distention. No thyroid enlargement, no tenderness.  LUNGS: Normal breath sounds bilaterally, no wheezing, rales,rhonchi or crepitation. No use of accessory muscles of respiration.  CARDIOVASCULAR: S1, S2 normal. No murmurs, rubs, or gallops.  ABDOMEN: Soft, non-tender, non-distended. Bowel sounds present. No organomegaly or mass.  EXTREMITIES: No pedal edema, cyanosis, or clubbing.  NEUROLOGIC: Cranial nerves II through XII are intact. Muscle strength 5/5 in all extremities. Sensation intact. Gait not checked.  PSYCHIATRIC: The patient is alert and oriented x 3.  SKIN: No obvious rash, lesion, or ulcer.  DATA REVIEW:   CBC  Recent Labs Lab 09/23/15 0431  WBC 7.2  HGB 13.7  HCT 39.8*  PLT 177    Chemistries   Recent Labs Lab 09/22/15 1145  NA 137  K 4.4  CL 101  CO2 28  GLUCOSE 352*  BUN 17  CREATININE 0.85  CALCIUM 10.1    Cardiac Enzymes  Recent Labs Lab 09/23/15 0431  TROPONINI 0.03    Microbiology Results  No results found for this or any previous visit.  RADIOLOGY:  No results found.   Management plans discussed with the patient, family and they are in agreement.  CODE STATUS:  Code Status History    Date Active Date Inactive Code Status Order ID Comments User Context   09/22/2015  5:05 PM 09/23/2015  3:06 PM Full Code [id number]  Shaune Pollack, MD Inpatient     Follow-up Information    Follow up with [facility], MD. Schedule an appointment as soon as possible for a visit on  09/25/2015.   Specialty:  Cardiology   Why:  @ 10 am    Contact information:   [address] [place] [address] [phone]       Follow up with Bluford Main AHMED, MD. Schedule an appointment as soon as possible for a visit in 1 week.   Specialty:  Internal Medicine   Why:  [facility] Discharge F/UP   Contact information:   [address] [place] [address] [phone]        TOTAL TIME TAKING CARE OF THIS PATIENT: 40 minutes.    [facility], Reford Olliff M.D on 09/25/2015 at 4:33 PM  Between 7am to 6pm - Pager - [phone]  After 6pm go to [email or web address] - password [email or web address] [facility]  [place] [facility]  Office  [phone]  CC: Primary care physician; Luna Fuse, MD Laurier Nancy, MD  Note: This dictation was prepared with Dragon dictation along with smaller phrase technology. Any transcriptional errors that result from this process are unintentional.

## 2019-10-19 ENCOUNTER — Ambulatory Visit: Payer: Self-pay

## 2019-10-29 ENCOUNTER — Ambulatory Visit: Payer: Self-pay

## 2019-10-31 ENCOUNTER — Emergency Department: Payer: Medicare Other

## 2019-10-31 ENCOUNTER — Other Ambulatory Visit: Payer: Self-pay

## 2019-10-31 ENCOUNTER — Inpatient Hospital Stay
Admission: EM | Admit: 2019-10-31 | Discharge: 2019-11-11 | DRG: 871 | Disposition: A | Discharge status: Home Health | Payer: Medicare Other | Attending: Internal Medicine | Admitting: Internal Medicine

## 2019-10-31 DIAGNOSIS — R7881 Bacteremia: Secondary | ICD-10-CM | POA: Diagnosis not present

## 2019-10-31 DIAGNOSIS — Z79899 Other long term (current) drug therapy: Secondary | ICD-10-CM | POA: Diagnosis not present

## 2019-10-31 DIAGNOSIS — K82A2 Perforation of gallbladder in cholecystitis: Secondary | ICD-10-CM | POA: Diagnosis not present

## 2019-10-31 DIAGNOSIS — K750 Abscess of liver: Secondary | ICD-10-CM | POA: Diagnosis present

## 2019-10-31 DIAGNOSIS — Z87891 Personal history of nicotine dependence: Secondary | ICD-10-CM

## 2019-10-31 DIAGNOSIS — Z7984 Long term (current) use of oral hypoglycemic drugs: Secondary | ICD-10-CM

## 2019-10-31 DIAGNOSIS — R6520 Severe sepsis without septic shock: Secondary | ICD-10-CM | POA: Diagnosis not present

## 2019-10-31 DIAGNOSIS — E1165 Type 2 diabetes mellitus with hyperglycemia: Secondary | ICD-10-CM | POA: Diagnosis present

## 2019-10-31 DIAGNOSIS — Z7982 Long term (current) use of aspirin: Secondary | ICD-10-CM

## 2019-10-31 DIAGNOSIS — Z833 Family history of diabetes mellitus: Secondary | ICD-10-CM

## 2019-10-31 DIAGNOSIS — Z20822 Contact with and (suspected) exposure to covid-19: Secondary | ICD-10-CM | POA: Diagnosis present

## 2019-10-31 DIAGNOSIS — D649 Anemia, unspecified: Secondary | ICD-10-CM | POA: Diagnosis not present

## 2019-10-31 DIAGNOSIS — A4181 Sepsis due to Enterococcus: Secondary | ICD-10-CM | POA: Diagnosis present

## 2019-10-31 DIAGNOSIS — A419 Sepsis, unspecified organism: Secondary | ICD-10-CM | POA: Diagnosis not present

## 2019-10-31 DIAGNOSIS — N179 Acute kidney failure, unspecified: Secondary | ICD-10-CM | POA: Diagnosis present

## 2019-10-31 DIAGNOSIS — B955 Unspecified streptococcus as the cause of diseases classified elsewhere: Secondary | ICD-10-CM | POA: Diagnosis not present

## 2019-10-31 DIAGNOSIS — E8809 Other disorders of plasma-protein metabolism, not elsewhere classified: Secondary | ICD-10-CM | POA: Diagnosis present

## 2019-10-31 DIAGNOSIS — L0291 Cutaneous abscess, unspecified: Secondary | ICD-10-CM

## 2019-10-31 DIAGNOSIS — E860 Dehydration: Secondary | ICD-10-CM | POA: Diagnosis present

## 2019-10-31 DIAGNOSIS — A408 Other streptococcal sepsis: Secondary | ICD-10-CM | POA: Diagnosis present

## 2019-10-31 DIAGNOSIS — E871 Hypo-osmolality and hyponatremia: Secondary | ICD-10-CM | POA: Diagnosis present

## 2019-10-31 DIAGNOSIS — D539 Nutritional anemia, unspecified: Secondary | ICD-10-CM | POA: Diagnosis present

## 2019-10-31 DIAGNOSIS — B954 Other streptococcus as the cause of diseases classified elsewhere: Secondary | ICD-10-CM | POA: Diagnosis not present

## 2019-10-31 DIAGNOSIS — R627 Adult failure to thrive: Secondary | ICD-10-CM | POA: Diagnosis present

## 2019-10-31 DIAGNOSIS — Z6824 Body mass index (BMI) 24.0-24.9, adult: Secondary | ICD-10-CM | POA: Diagnosis not present

## 2019-10-31 DIAGNOSIS — K8001 Calculus of gallbladder with acute cholecystitis with obstruction: Secondary | ICD-10-CM

## 2019-10-31 DIAGNOSIS — K810 Acute cholecystitis: Secondary | ICD-10-CM

## 2019-10-31 DIAGNOSIS — E119 Type 2 diabetes mellitus without complications: Secondary | ICD-10-CM | POA: Diagnosis not present

## 2019-10-31 DIAGNOSIS — R531 Weakness: Secondary | ICD-10-CM | POA: Diagnosis present

## 2019-10-31 DIAGNOSIS — Z87820 Personal history of traumatic brain injury: Secondary | ICD-10-CM | POA: Diagnosis not present

## 2019-10-31 DIAGNOSIS — D6489 Other specified anemias: Secondary | ICD-10-CM | POA: Diagnosis present

## 2019-10-31 DIAGNOSIS — Z8679 Personal history of other diseases of the circulatory system: Secondary | ICD-10-CM

## 2019-10-31 DIAGNOSIS — I10 Essential (primary) hypertension: Secondary | ICD-10-CM | POA: Diagnosis present

## 2019-10-31 DIAGNOSIS — A409 Streptococcal sepsis, unspecified: Secondary | ICD-10-CM | POA: Diagnosis not present

## 2019-10-31 DIAGNOSIS — K8020 Calculus of gallbladder without cholecystitis without obstruction: Secondary | ICD-10-CM

## 2019-10-31 DIAGNOSIS — B952 Enterococcus as the cause of diseases classified elsewhere: Secondary | ICD-10-CM | POA: Diagnosis not present

## 2019-10-31 DIAGNOSIS — K812 Acute cholecystitis with chronic cholecystitis: Secondary | ICD-10-CM

## 2019-10-31 DIAGNOSIS — K8012 Calculus of gallbladder with acute and chronic cholecystitis without obstruction: Secondary | ICD-10-CM | POA: Diagnosis present

## 2019-10-31 DIAGNOSIS — E46 Unspecified protein-calorie malnutrition: Secondary | ICD-10-CM | POA: Diagnosis present

## 2019-10-31 DIAGNOSIS — Z794 Long term (current) use of insulin: Secondary | ICD-10-CM | POA: Diagnosis not present

## 2019-10-31 DIAGNOSIS — R7989 Other specified abnormal findings of blood chemistry: Secondary | ICD-10-CM

## 2019-10-31 DIAGNOSIS — Z9049 Acquired absence of other specified parts of digestive tract: Secondary | ICD-10-CM | POA: Diagnosis not present

## 2019-10-31 DIAGNOSIS — K811 Chronic cholecystitis: Secondary | ICD-10-CM

## 2019-10-31 DIAGNOSIS — D72829 Elevated white blood cell count, unspecified: Secondary | ICD-10-CM | POA: Diagnosis not present

## 2019-10-31 DIAGNOSIS — K8010 Calculus of gallbladder with chronic cholecystitis without obstruction: Secondary | ICD-10-CM | POA: Diagnosis not present

## 2019-10-31 DIAGNOSIS — Z978 Presence of other specified devices: Secondary | ICD-10-CM | POA: Diagnosis not present

## 2019-10-31 HISTORY — DX: Type 2 diabetes mellitus without complications: E11.9

## 2019-10-31 HISTORY — DX: Essential (primary) hypertension: I10

## 2019-10-31 LAB — URINALYSIS, COMPLETE (UACMP) WITH MICROSCOPIC
Bacteria, UA: NONE SEEN
Bilirubin Urine: NEGATIVE
Glucose, UA: >=500 mg/dL — AB
Hgb urine dipstick: NEGATIVE
Ketones, ur: 5 mg/dL — AB
Leukocytes,Ua: NEGATIVE
Nitrite: NEGATIVE
Protein, ur: 100 mg/dL — AB
Specific Gravity, Urine: 1.018 (ref 1.005–1.030)
pH: 7 (ref 5.0–8.0)

## 2019-10-31 LAB — COMPREHENSIVE METABOLIC PANEL
ALT: 146 U/L — ABNORMAL HIGH (ref 0–44)
AST: 123 U/L — ABNORMAL HIGH (ref 15–41)
Albumin: 2.7 g/dL — ABNORMAL LOW (ref 3.5–5.0)
Alkaline Phosphatase: 242 U/L — ABNORMAL HIGH (ref 38–126)
Anion gap: 11 (ref 5–15)
BUN: 28 mg/dL — ABNORMAL HIGH (ref 8–23)
CO2: 24 mmol/L (ref 22–32)
Calcium: 9.1 mg/dL (ref 8.9–10.3)
Chloride: 91 mmol/L — ABNORMAL LOW (ref 98–111)
Creatinine, Ser: 1.46 mg/dL — ABNORMAL HIGH (ref 0.61–1.24)
GFR calc Af Amer: 54 mL/min — ABNORMAL LOW (ref >60)
GFR calc non Af Amer: 46 mL/min — ABNORMAL LOW (ref >60)
Glucose, Bld: 509 mg/dL (ref 70–99)
Potassium: 4.7 mmol/L (ref 3.5–5.1)
Sodium: 126 mmol/L — ABNORMAL LOW (ref 135–145)
Total Bilirubin: 1 mg/dL (ref 0.3–1.2)
Total Protein: 7.6 g/dL (ref 6.5–8.1)

## 2019-10-31 LAB — PROCALCITONIN: Procalcitonin: 1.82 ng/mL

## 2019-10-31 LAB — CBC WITH DIFFERENTIAL/PLATELET
Abs Immature Granulocytes: 0.13 10*3/uL — ABNORMAL HIGH (ref 0.00–0.07)
Basophils Absolute: 0 10*3/uL (ref 0.0–0.1)
Basophils Relative: 0 %
Eosinophils Absolute: 0 10*3/uL (ref 0.0–0.5)
Eosinophils Relative: 0 %
HCT: 30.9 % — ABNORMAL LOW (ref 39.0–52.0)
Hemoglobin: 10.3 g/dL — ABNORMAL LOW (ref 13.0–17.0)
Immature Granulocytes: 1 %
Lymphocytes Relative: 3 %
Lymphs Abs: 0.4 10*3/uL — ABNORMAL LOW (ref 0.7–4.0)
MCH: 28.8 pg (ref 26.0–34.0)
MCHC: 33.3 g/dL (ref 30.0–36.0)
MCV: 86.3 fL (ref 80.0–100.0)
Monocytes Absolute: 1.3 10*3/uL — ABNORMAL HIGH (ref 0.1–1.0)
Monocytes Relative: 9 %
Neutro Abs: 12.5 10*3/uL — ABNORMAL HIGH (ref 1.7–7.7)
Neutrophils Relative %: 87 %
Platelets: 297 10*3/uL (ref 150–400)
RBC: 3.58 MIL/uL — ABNORMAL LOW (ref 4.22–5.81)
RDW: 12.7 % (ref 11.5–15.5)
WBC: 14.5 10*3/uL — ABNORMAL HIGH (ref 4.0–10.5)
nRBC: 0 % (ref 0.0–0.2)

## 2019-10-31 LAB — POC SARS CORONAVIRUS 2 AG: SARS Coronavirus 2 Ag: NEGATIVE

## 2019-10-31 LAB — LACTIC ACID, PLASMA: Lactic Acid, Venous: 1.6 mmol/L (ref 0.5–1.9)

## 2019-10-31 MED ORDER — VANCOMYCIN HCL 1250 MG/250ML IV SOLN: 1250.0000 mg | INTRAVENOUS | Status: DC

## 2019-10-31 MED ORDER — INSULIN ASPART 100 UNIT/ML ~~LOC~~ SOLN
8.0000 [IU] | Freq: Once | SUBCUTANEOUS | Status: AC
  Administered 2019-10-31: 8 [IU] via INTRAVENOUS
  Filled 2019-10-31: qty 1

## 2019-10-31 MED ORDER — METRONIDAZOLE IN NACL 5-0.79 MG/ML-% IV SOLN
500.0000 mg | Freq: Once | INTRAVENOUS | Status: AC
  Administered 2019-10-31: 500 mg via INTRAVENOUS
  Filled 2019-10-31: qty 100

## 2019-10-31 MED ORDER — METRONIDAZOLE IN NACL 5-0.79 MG/ML-% IV SOLN
500.0000 mg | Freq: Three times a day (TID) | INTRAVENOUS | Status: DC
  Administered 2019-11-01: 500 mg via INTRAVENOUS
  Filled 2019-10-31: qty 100

## 2019-10-31 MED ORDER — SODIUM CHLORIDE 0.9 % IV BOLUS
1000.0000 mL | Freq: Once | INTRAVENOUS | Status: AC
  Administered 2019-10-31: 1000 mL via INTRAVENOUS

## 2019-10-31 MED ORDER — SODIUM CHLORIDE 0.9 % IV SOLN
2.0000 g | Freq: Two times a day (BID) | INTRAVENOUS | Status: DC
  Administered 2019-11-01: 2 g via INTRAVENOUS
  Filled 2019-10-31: qty 2

## 2019-10-31 MED ORDER — SODIUM CHLORIDE 0.9 % IV SOLN
2.0000 g | Freq: Once | INTRAVENOUS | Status: AC
  Administered 2019-10-31: 2 g via INTRAVENOUS
  Filled 2019-10-31: qty 2

## 2019-10-31 MED ORDER — ACETAMINOPHEN 500 MG PO TABS
1000.0000 mg | ORAL_TABLET | Freq: Once | ORAL | Status: AC
  Administered 2019-10-31: 1000 mg via ORAL
  Filled 2019-10-31: qty 2

## 2019-10-31 MED ORDER — VANCOMYCIN HCL IN DEXTROSE 1-5 GM/200ML-% IV SOLN
1000.0000 mg | Freq: Once | INTRAVENOUS | Status: AC
  Administered 2019-10-31: 1000 mg via INTRAVENOUS
  Filled 2019-10-31: qty 200

## 2019-10-31 MED ORDER — IOHEXOL 300 MG/ML  SOLN
100.0000 mL | Freq: Once | INTRAMUSCULAR | Status: AC | PRN
  Administered 2019-10-31: 100 mL via INTRAVENOUS

## 2019-10-31 MED ORDER — VANCOMYCIN HCL IN DEXTROSE 1-5 GM/200ML-% IV SOLN
1000.0000 mg | Freq: Once | INTRAVENOUS | Status: AC
  Administered 2019-11-01: 1000 mg via INTRAVENOUS
  Filled 2019-10-31: qty 200

## 2019-10-31 MED ORDER — LACTATED RINGERS IV SOLN: INTRAVENOUS | Status: DC

## 2019-10-31 NOTE — Progress Notes (Signed)
74 yo DM male with presentation of fever and N/V, with h/o RUQ pain.  Has remarkably distended GB, with likely hepatic abscess, ?perf - though clearly not a free perforation as I have noted on my review of his CT imaging.   Advised admission for IV Abx, with anticipated perc drain to be placed by IR tomorrow, along with full surgical consultation 11/01/2019.

## 2019-10-31 NOTE — ED Triage Notes (Addendum)
Pt here from home via [facility]. EMS called code sepsis.  Per EMS pt has had increasing weakness and decreased PO intake for the last approximately four weeks. Pt's sister reports it has gotten to the point that he is too weak to get to the bathroom. Pt lives alone but sister has been visiting frequently for the past two weeks.   EMS VS- temp 102.8, cbg 533, HR 98-100, O2 94% room air.  Pt has hx of DM and HTN. Possible Parkinson's.

## 2019-10-31 NOTE — ED Notes (Signed)
Pt placed in clean brief and sheets changed at this time by this RN and Bennetta Laos.

## 2019-10-31 NOTE — Consult Note (Signed)
PHARMACY -  BRIEF ANTIBIOTIC NOTE   Pharmacy has received consult(s) for Cefepime and Vancomycin from an ED provider.  The patient's profile has been reviewed for ht/wt/allergies/indication/available labs.    One time order(s) placed for Cefepime 2gx1 dose and Vancomycin 1g x1   Further antibiotics/pharmacy consults should be ordered by admitting physician if indicated.                       Thank you, Katha Cabal 10/31/2019  7:36 PM

## 2019-10-31 NOTE — H&P (Signed)
Chief Complaint: I have generalized weakness with near falls at home. HPI: Kyle Ritter is an 74 y.o. male is a pleasant Hispanic male with medical history significant for obesity, type 2 diabetes mellitus, history of brain aneurysm.  He presented to the emergency room this evening upon insistence of cyst on account of generalized weakness with near falls at home.  Patient was also reported to have been febrile at home raising concern for possible infection has EMS being called.  As per patient, he was tested for Covid a couple of days ago, which came back negative.  Due to persistence of his symptoms, patient was brought in.  He denies any right upper quadrant abdominal pain, admits to poor oral intake but denies any nausea or vomiting.  Denies any yellowish discoloration of eyes or skin.  Work-up in the ED revealed evidence of leukocytosis and fever.  Abdominal CT scan confirmed findings suggestive of acute on chronic cholecystitis with an initial impression of possible hepatic abscess.  General surgery was consulted and did review imaging studies with radiology.  Recommendations was for possible cholecystostomy tube.  Patient was initiated on treatment with IV antibiotics and IV fluids as per sepsis protocol.  Past Medical History:  Diagnosis Date  . Brain aneurysm   . Diabetes mellitus without complication (HCC)   . Hypertension     Past Surgical History:  Procedure Laterality Date  . CEREBRAL ANEURYSM REPAIR      Family History  Problem Relation Age of Onset  . Diabetes Mother   . Diabetes Sister    Social History:  reports that he has quit smoking. He has never used smokeless tobacco. He reports that he does not drink alcohol or use drugs.  Allergies: No Known Allergies  (Not in a hospital admission)   Results for orders placed or performed during the hospital encounter of 10/31/19 (from the past 48 hour(s))  Lactic acid, plasma     Status: None   Collection Time: 10/31/19  6:30  PM  Result Value Ref Range   Lactic Acid, Venous 1.6 0.5 - 1.9 mmol/L    Comment: Performed at [facility], [address], [place], [place] [address]  Comprehensive metabolic panel     Status: Abnormal   Collection Time: 10/31/19  6:30 PM  Result Value Ref Range   Sodium 126 (L) 135 - 145 mmol/L   Potassium 4.7 3.5 - 5.1 mmol/L   Chloride 91 (L) 98 - 111 mmol/L   CO2 24 22 - 32 mmol/L   Glucose, Bld 509 (HH) 70 - 99 mg/dL    Comment: CRITICAL RESULT CALLED TO, READ BACK BY AND VERIFIED WITH KATIE FERGUSON 10/31/19 1913 KLW Glucose reference range applies only to samples taken after fasting for at least 8 hours.    BUN 28 (H) 8 - 23 mg/dL   Creatinine, Ser 5.64 (H) 0.61 - 1.24 mg/dL   Calcium 9.1 8.9 - 33.2 mg/dL   Total Protein 7.6 6.5 - 8.1 g/dL   Albumin 2.7 (L) 3.5 - 5.0 g/dL   AST 951 (H) 15 - 41 U/L   ALT 146 (H) 0 - 44 U/L   Alkaline Phosphatase 242 (H) 38 - 126 U/L   Total Bilirubin 1.0 0.3 - 1.2 mg/dL   GFR calc non Af Amer 46 (L) >60 mL/min   GFR calc Af Amer 54 (L) >60 mL/min   Anion gap 11 5 - 15    Comment: Performed at [facility], [address] [place], [place] [address]  CBC WITH DIFFERENTIAL     Status: Abnormal   Collection Time: 10/31/19  6:30 PM  Result Value Ref Range   WBC 14.5 (H) 4.0 - 10.5 K/uL   RBC 3.58 (L) 4.22 - 5.81 MIL/uL   Hemoglobin 10.3 (L) 13.0 - 17.0 g/dL   HCT 60.4 (L) 54.0 - 98.1 %   MCV 86.3 80.0 - 100.0 fL   MCH 28.8 26.0 - 34.0 pg   MCHC 33.3 30.0 - 36.0 g/dL   RDW 19.1 47.8 - 29.5 %   Platelets 297 150 - 400 K/uL   nRBC 0.0 0.0 - 0.2 %   Neutrophils Relative % 87 %   Neutro Abs 12.5 (H) 1.7 - 7.7 K/uL   Lymphocytes Relative 3 %   Lymphs Abs 0.4 (L) 0.7 - 4.0 K/uL   Monocytes Relative 9 %   Monocytes Absolute 1.3 (H) 0.1 - 1.0 K/uL   Eosinophils Relative 0 %   Eosinophils Absolute 0.0 0.0 - 0.5 K/uL   Basophils Relative 0 %   Basophils Absolute 0.0 0.0 - 0.1 K/uL   Immature Granulocytes 1 %   Abs  Immature Granulocytes 0.13 (H) 0.00 - 0.07 K/uL    Comment: Performed at [facility], [address] [place], [place] [address]  Procalcitonin     Status: None   Collection Time: 10/31/19  6:30 PM  Result Value Ref Range   Procalcitonin 1.82 ng/mL    Comment:        Interpretation: PCT > 0.5 ng/mL and <= 2 ng/mL: Systemic infection (sepsis) is possible, but other conditions are known to elevate PCT as well. (NOTE)       Sepsis PCT Algorithm           Lower Respiratory Tract                                      Infection PCT Algorithm    ----------------------------     ----------------------------         PCT < 0.25 ng/mL                PCT < 0.10 ng/mL         Strongly encourage             Strongly discourage   discontinuation of antibiotics    initiation of antibiotics    ----------------------------     -----------------------------       PCT 0.25 - 0.50 ng/mL            PCT 0.10 - 0.25 ng/mL               OR       >80% decrease in PCT            Discourage initiation of                                            antibiotics      Encourage discontinuation           of antibiotics    ----------------------------     -----------------------------         PCT >= 0.50 ng/mL              PCT 0.26 - 0.50  ng/mL                AND       <80% decrease in PCT             Encourage initiation of                                             antibiotics       Encourage continuation           of antibiotics    ----------------------------     -----------------------------        PCT >= 0.50 ng/mL                  PCT > 0.50 ng/mL               AND         increase in PCT                  Strongly encourage                                      initiation of antibiotics    Strongly encourage escalation           of antibiotics                                     -----------------------------                                           PCT <= 0.25 ng/mL                                                  OR                                        > 80% decrease in PCT                                     Discontinue / Do not initiate                                             antibiotics Performed at [facility], [address] [place], [place] [address]   Urinalysis, Complete w Microscopic     Status: Abnormal   Collection Time: 10/31/19  7:55 PM  Result Value Ref Range   Color, Urine YELLOW (A) YELLOW   APPearance CLEAR (A) CLEAR   Specific Gravity, Urine 1.018 1.005 - 1.030   pH 7.0 5.0 - 8.0   Glucose, UA >=500 (A) NEGATIVE mg/dL   Hgb urine dipstick NEGATIVE NEGATIVE   Bilirubin Urine NEGATIVE  NEGATIVE   Ketones, ur 5 (A) NEGATIVE mg/dL   Protein, ur 161100 (A) NEGATIVE mg/dL   Nitrite NEGATIVE NEGATIVE   Leukocytes,Ua NEGATIVE NEGATIVE   RBC / HPF 0-5 0 - 5 RBC/hpf   WBC, UA 0-5 0 - 5 WBC/hpf   Bacteria, UA NONE SEEN NONE SEEN   Squamous Epithelial / LPF 0-5 0 - 5    Comment: Performed at [facility], [address] [place], [place] [address]  POC SARS Coronavirus 2 Ag     Status: None   Collection Time: 10/31/19  8:20 PM  Result Value Ref Range   SARS Coronavirus 2 Ag NEGATIVE NEGATIVE    Comment: (NOTE) SARS-CoV-2 antigen NOT DETECTED.  Negative results are presumptive.  Negative results do not preclude SARS-CoV-2 infection and should not be used as the sole basis for treatment or other patient management decisions, including infection  control decisions, particularly in the presence of clinical signs and  symptoms consistent with COVID-19, or in those who have been in contact with the virus.  Negative results must be combined with clinical observations, patient history, and epidemiological information. The expected result is Negative. Fact Sheet for Patients: https://sanders-williams.net/https://www.fda.gov/media/139754/download Fact Sheet for Healthcare Providers: https://martinez.com/https://www.fda.gov/media/139753/download This test is not yet approved or cleared by  the [place] FDA and  has been authorized for detection and/or diagnosis of SARS-CoV-2 by FDA under an Emergency Use Authorization (EUA).  This EUA will remain in effect (meaning this test can be used) for the duration of  the COVID-19 de claration under Section 564(b)(1) of the Act, 21 U.S.C. section 360bbb-3(b)(1), unless the authorization is terminated or revoked sooner.    CT ABDOMEN PELVIS W CONTRAST  Result Date: 10/31/2019 CLINICAL DATA:  Right lower quadrant pain. Concern for acute appendicitis. EXAM: CT ABDOMEN AND PELVIS WITH CONTRAST TECHNIQUE: Multidetector CT imaging of the abdomen and pelvis was performed using the standard protocol following bolus administration of intravenous contrast. CONTRAST:  100mL OMNIPAQUE IOHEXOL 300 MG/ML  SOLN COMPARISON:  None. FINDINGS: Lower chest: The lung bases are clear. The heart size is normal. Coronary artery calcifications are noted. Hepatobiliary: The liver is normal. There is massive dilatation of the gallbladder. The gallbladder wall appears thickened and is irregular. There is a probable perforation superiorly as evidence by extension into the adjacent hepatic parenchyma with pockets of gas. This collection measures approximately 4.6 x 2.3 cm (axial series 5, image 23). There appears to be a 1.8 cm stone near the gallbladder neck.There is some mild intrahepatic ductal dilatation. The common bile duct is difficult to fully evaluate but does not appear to be significantly dilated. Pancreas: Normal contours without ductal dilatation. No peripancreatic fluid collection. Spleen: Unremarkable. Adrenals/Urinary Tract: --Adrenal glands: Unremarkable. --Right kidney/ureter: No hydronephrosis or radiopaque kidney stones. --Left kidney/ureter: No hydronephrosis or radiopaque kidney stones. --Urinary bladder: Unremarkable. Stomach/Bowel: --Stomach/Duodenum: No hiatal hernia or other gastric abnormality. Normal duodenal course and caliber. --Small bowel:  Unremarkable. --Colon: Rectosigmoid diverticulosis without acute inflammation. --Appendix: Normal. Vascular/Lymphatic: Atherosclerotic calcification is present within the non-aneurysmal abdominal aorta, without hemodynamically significant stenosis. --No retroperitoneal lymphadenopathy. --No mesenteric lymphadenopathy. --No pelvic or inguinal lymphadenopathy. Reproductive: Unremarkable Other: No ascites or free air. Bilateral fat containing inguinal hernias are noted. Musculoskeletal. No acute displaced fractures. IMPRESSION: 1. Massive dilatation of the gallbladder with findings most consistent with perforated cholecystitis. There appears to be a developing intrahepatic abscess as detailed above. 2. Rectosigmoid diverticulosis without acute inflammation. 3. Normal appendix in the right lower quadrant. 4. Aortic Atherosclerosis (  ICD10-I70.0). Electronically Signed   By: Katherine Mantle M.D.   On: 10/31/2019 22:06   DG Chest Port 1 View  Result Date: 10/31/2019 CLINICAL DATA:  Sepsis EXAM: PORTABLE CHEST 1 VIEW COMPARISON:  09/22/2015 FINDINGS: Low lung volumes. No confluent opacities or effusions. Heart is normal size. No acute bony abnormality. IMPRESSION: Low lung volumes.  No acute cardiopulmonary disease. Electronically Signed   By: Charlett Nose M.D.   On: 10/31/2019 19:11   US ABDOMEN LIMITED RUQ  Result Date: 10/31/2019 CLINICAL DATA:  Elevated LFTs. EXAM: ULTRASOUND ABDOMEN LIMITED RIGHT UPPER QUADRANT COMPARISON:  None. FINDINGS: Gallbladder: Abnormal, markedly distended with large amount of heterogeneous internal echogenic debris likely combination of stones and sludge. No evidence of internal vascularity. Diffuse gallbladder wall thickening measuring up to 7 mm. Small amount of pericholecystic fluid. No sonographic Murphy sign noted by sonographer. Common bile duct: Diameter: Not well-defined, tentatively visualized at 2 mm. Liver: Heterogeneously increased echogenicity. No evidence of focal  lesion. No definite capsular nodularity. Portal vein is patent on color Doppler imaging with normal direction of blood flow towards the liver. Other: None. IMPRESSION: 1. Abnormal appearance of the gallbladder which is markedly distended/hydropic and filled with heterogeneous echogenic debris. This may represent combination of stones and sludge. Diffuse gallbladder wall thickening. 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 No internal vascularity or hyperemia by ultrasound. Recommend further evaluation with cross-sectional imaging. MRCP would be the imaging modality of choice if patient is able to tolerate breath hold technique. 2. Heterogeneous increased hepatic echogenicity most consistent with steatosis. 3. No common bile duct dilatation. Electronically Signed   By: Narda Rutherford M.D.   On: 10/31/2019 20:59    Review of Systems  Constitutional: Positive for appetite change and fatigue.  Eyes: Negative.   Respiratory: Negative.   Gastrointestinal: Negative.  Negative for abdominal distention, abdominal pain and blood in stool.  Endocrine: Negative.   Genitourinary: Negative.   Allergic/Immunologic: Negative.   Neurological: Negative.     Blood pressure 133/88, pulse 92, temperature 100.1 F (37.8 C), temperature source Oral, resp. rate 19, height 5' 11.5" (1.816 m), weight 82.5 kg, SpO2 96 %. Physical Exam  Constitutional: He is oriented to person, place, and time. He appears well-developed and well-nourished. He is active.  HENT:  Head: Normocephalic and atraumatic.  Eyes: EOM are normal.  Cardiovascular: Regular rhythm and normal heart sounds.  Respiratory: Effort normal and breath sounds normal. No respiratory distress.  GI: Soft. He exhibits no distension. There is no abdominal tenderness. There is no rebound and no guarding.  Musculoskeletal:     Cervical back: Normal range of motion and neck supple.  Neurological: He is alert and oriented to person, place, and time. He  has normal reflexes.  Skin: Skin is warm and dry.  Psychiatric: He has a normal mood and affect.     Assessment/Plan 1.  Acute cholecystitis with imaging studies suggestive of?perforation.  Case was discussed by ED and general surgery.  Not a candidate for surgical intervention at this time.  Plan is for percutaneous cholecystostomy tube placement by IR in a.m.  General surgery preliminary note appreciated.  #2.  Sepsis secondary to acute on chronic cholecystitis.  Presents admission.  Patient will be initiated on sepsis protocol with IV fluids were initiated in the ED.  IV antibiotics with cefepime and vancomycin were also initiated.  Blood cultures were requested and pending.  Will tailor antibiotics according to cultures and sensitivity.  #3.  Acute kidney injury most likely  prerenal etiology secondary to poor oral intake.  Patient is being volume repleted with IV fluids.  Input and output monitoring.  Avoid nephrotoxic medications at this time.  #4.  Acute hyponatremia, present on admission likely due to poor solute and oral intake.  Urine electrolytes will be sent.  We will volume replete with normal saline.  Follow-up Chem-7 in a.m.  Patient is asymptomatic for hyponatremia.  #5.  Type 2 diabetes mellitus on Metformin.  Blood glucose uncontrolled on admission.  Hold Metformin for now.  We will optimize with insulin sliding scale.  Send for A1c.  Long-acting insulin with Levemir may be warranted for further optimization of blood glucose.  #6.  Remote history of brain aneurysm  #7.  DVT prophylaxis with heparin.  Hold heparin prior to anticipated procedure in a.m.   [place], MD 10/31/2019, 11:05 PM

## 2019-10-31 NOTE — ED Notes (Signed)
Pt placed in clean brief. External male catheter placed for pt comfort.

## 2019-10-31 NOTE — ED Notes (Signed)
SST, grey on ice, blue, green, purple, and x2 blood cultures sent to lab at this time.

## 2019-10-31 NOTE — ED Notes (Signed)
Pt taken to CT.

## 2019-10-31 NOTE — ED Notes (Signed)
Ultrasound at bedside

## 2019-10-31 NOTE — ED Provider Notes (Signed)
[facility] Emergency Department Provider Note    First MD Initiated Contact with Patient 10/31/19 1833     (approximate)  I have reviewed the triage vital signs and the nursing notes.   HISTORY  Chief Complaint sepsis    HPI Kyle Ritter is a 74 y.o. male post past medical history presents to the ER for generalized weakness and fever.  Feels like he has been getting more weak over the past 2 3 days.  Had significant decline today at home and is febrile.  Is not been on any recent antibiotics.  He is a diabetic.  Denies any wounds.  No nausea or vomiting.  No congestion.  No cough.  No abdominal pain.    Past Medical History:  Diagnosis Date  . Brain aneurysm   . Diabetes mellitus without complication (HCC)   . Hypertension    Family History  Problem Relation Age of Onset  . Diabetes Mother   . Diabetes Sister    Past Surgical History:  Procedure Laterality Date  . CEREBRAL ANEURYSM REPAIR     Patient Active Problem List   Diagnosis Date Noted  . Elevated troponin 09/22/2015      Prior to Admission medications   Medication Sig Start Date End Date Taking? Authorizing Provider  amLODipine (NORVASC) 10 MG tablet Take 1 tablet (10 mg total) by mouth daily. 09/23/15  Yes Delfino Lovett, MD  atorvastatin (LIPITOR) 40 MG tablet Take 1 tablet (40 mg total) by mouth daily at 6 PM. 09/23/15  Yes Delfino Lovett, MD  metFORMIN (GLUCOPHAGE) 500 MG tablet Take 1 tablet (500 mg total) by mouth 2 (two) times daily with a meal. 09/23/15  Yes Delfino Lovett, MD  nitroGLYCERIN (NITROSTAT) 0.4 MG SL tablet Place 0.4 mg under the tongue as needed for chest pain.   Yes [provider]  primidone (MYSOLINE) 50 MG tablet Take 50 mg by mouth 4 (four) times daily. Take 2 tablets by mouth once daily and 2 tablets in the evening   Yes [provider]  aspirin EC 81 MG EC tablet Take 1 tablet (81 mg total) by mouth daily. 09/23/15   Delfino Lovett, MD     Allergies Patient has no known allergies.    Social History Social History   Tobacco Use  . Smoking status: Former Games developer  . Smokeless tobacco: Never Used  Substance Use Topics  . Alcohol use: No    Comment: quit alcohol 4 years ago.  . Drug use: Never    Review of Systems Patient denies headaches, rhinorrhea, blurry vision, numbness, shortness of breath, chest pain, edema, cough, abdominal pain, nausea, vomiting, diarrhea, dysuria, fevers, rashes or hallucinations unless otherwise stated above in HPI. ____________________________________________   PHYSICAL EXAM:  VITAL SIGNS: Vitals:   10/31/19 2118 10/31/19 2135  BP: (!) 136/96 133/88  Pulse: 95 92  Resp: 19 19  Temp: 100.1 F (37.8 C)   SpO2: 96% 96%    Constitutional: Alert , ill appearing  Eyes: Conjunctivae are normal.  Head: Atraumatic. Nose: No congestion/rhinnorhea. Mouth/Throat: Mucous membranes are moist.   Neck: No stridor. Painless ROM.  Cardiovascular: Normal rate, regular rhythm. Grossly normal heart sounds.  Good peripheral circulation. Respiratory: Normal respiratory effort.  No retractions. Lungs with coarse bibasilar breathsounds Gastrointestinal: Soft and nontender. No distention. No abdominal bruits. No CVA tenderness. Genitourinary:  Musculoskeletal: No lower extremity tenderness nor edema.  No joint effusions. Neurologic:  Normal speech and language. No gross focal neurologic deficits  are appreciated. No facial droop Skin:  Skin is warm, dry and intact. No rash noted. Psychiatric: Mood and affect are normal. Speech and behavior are normal.  ____________________________________________   LABS (all labs ordered are listed, but only abnormal results are displayed)  Results for orders placed or performed during the hospital encounter of 10/31/19 (from the past 24 hour(s))  Lactic acid, plasma     Status: None   Collection Time: 10/31/19  6:30 PM  Result Value Ref Range   Lactic Acid,  Venous 1.6 0.5 - 1.9 mmol/L  Comprehensive metabolic panel     Status: Abnormal   Collection Time: 10/31/19  6:30 PM  Result Value Ref Range   Sodium 126 (L) 135 - 145 mmol/L   Potassium 4.7 3.5 - 5.1 mmol/L   Chloride 91 (L) 98 - 111 mmol/L   CO2 24 22 - 32 mmol/L   Glucose, Bld 509 (HH) 70 - 99 mg/dL   BUN 28 (H) 8 - 23 mg/dL   Creatinine, Ser 1.46 (H) 0.61 - 1.24 mg/dL   Calcium 9.1 8.9 - 10.3 mg/dL   Total Protein 7.6 6.5 - 8.1 g/dL   Albumin 2.7 (L) 3.5 - 5.0 g/dL   AST 123 (H) 15 - 41 U/L   ALT 146 (H) 0 - 44 U/L   Alkaline Phosphatase 242 (H) 38 - 126 U/L   Total Bilirubin 1.0 0.3 - 1.2 mg/dL   GFR calc non Af Amer 46 (L) >60 mL/min   GFR calc Af Amer 54 (L) >60 mL/min   Anion gap 11 5 - 15  CBC WITH DIFFERENTIAL     Status: Abnormal   Collection Time: 10/31/19  6:30 PM  Result Value Ref Range   WBC 14.5 (H) 4.0 - 10.5 K/uL   RBC 3.58 (L) 4.22 - 5.81 MIL/uL   Hemoglobin 10.3 (L) 13.0 - 17.0 g/dL   HCT 30.9 (L) 39.0 - 52.0 %   MCV 86.3 80.0 - 100.0 fL   MCH 28.8 26.0 - 34.0 pg   MCHC 33.3 30.0 - 36.0 g/dL   RDW 12.7 11.5 - 15.5 %   Platelets 297 150 - 400 K/uL   nRBC 0.0 0.0 - 0.2 %   Neutrophils Relative % 87 %   Neutro Abs 12.5 (H) 1.7 - 7.7 K/uL   Lymphocytes Relative 3 %   Lymphs Abs 0.4 (L) 0.7 - 4.0 K/uL   Monocytes Relative 9 %   Monocytes Absolute 1.3 (H) 0.1 - 1.0 K/uL   Eosinophils Relative 0 %   Eosinophils Absolute 0.0 0.0 - 0.5 K/uL   Basophils Relative 0 %   Basophils Absolute 0.0 0.0 - 0.1 K/uL   Immature Granulocytes 1 %   Abs Immature Granulocytes 0.13 (H) 0.00 - 0.07 K/uL  Procalcitonin     Status: None   Collection Time: 10/31/19  6:30 PM  Result Value Ref Range   Procalcitonin 1.82 ng/mL  Urinalysis, Complete w Microscopic     Status: Abnormal   Collection Time: 10/31/19  7:55 PM  Result Value Ref Range   Color, Urine YELLOW (A) YELLOW   APPearance CLEAR (A) CLEAR   Specific Gravity, Urine 1.018 1.005 - 1.030   pH 7.0 5.0 - 8.0    Glucose, UA >=500 (A) NEGATIVE mg/dL   Hgb urine dipstick NEGATIVE NEGATIVE   Bilirubin Urine NEGATIVE NEGATIVE   Ketones, ur 5 (A) NEGATIVE mg/dL   Protein, ur 100 (A) NEGATIVE mg/dL   Nitrite NEGATIVE NEGATIVE  Leukocytes,Ua NEGATIVE NEGATIVE   RBC / HPF 0-5 0 - 5 RBC/hpf   WBC, UA 0-5 0 - 5 WBC/hpf   Bacteria, UA NONE SEEN NONE SEEN   Squamous Epithelial / LPF 0-5 0 - 5  POC SARS Coronavirus 2 Ag     Status: None   Collection Time: 10/31/19  8:20 PM  Result Value Ref Range   SARS Coronavirus 2 Ag NEGATIVE NEGATIVE   ____________________________________________  EKG My review and personal interpretation at Time: 18:24   Indication: fever  Rate: 100  Rhythm: sinus Axis: left Other: normal intervals, no stemi ____________________________________________  RADIOLOGY  I personally reviewed all radiographic images ordered to evaluate for the above acute complaints and reviewed radiology reports and findings.  These findings were personally discussed with the patient.  Please see medical record for radiology report.  ____________________________________________   PROCEDURES  Procedure(s) performed:  .Critical Care Performed by: Willy Eddy, MD Authorized by: Willy Eddy, MD   Critical care provider statement:    Critical care time (minutes):  35   Critical care time was exclusive of:  Separately billable procedures and treating other patients   Critical care was necessary to treat or prevent imminent or life-threatening deterioration of the following conditions:  Sepsis   Critical care was time spent personally by me on the following activities:  Development of treatment plan with patient or surrogate, discussions with consultants, evaluation of patient's response to treatment, examination of patient, obtaining history from patient or surrogate, ordering and performing treatments and interventions, ordering and review of laboratory studies, ordering and review of  radiographic studies, pulse oximetry, re-evaluation of patient's condition and review of old charts      Critical Care performed: yes ____________________________________________   INITIAL IMPRESSION / ASSESSMENT AND PLAN / ED COURSE  Pertinent labs & imaging results that were available during my care of the patient were reviewed by me and considered in my medical decision making (see chart for details).   DDX: Sepsis, bacteremia, pneumonia, UTI, appendicitis, diverticulitis, cholecystitis, abscess  Kyle Ritter is a 74 y.o. who presents to the ED with symptoms as described above.  Patient febrile having rigors.  Protecting his airway.  The patient will be placed on continuous pulse oximetry and telemetry for monitoring.  Laboratory evaluation will be sent to evaluate for the above complaints.     Clinical Course as of Oct 30 2233  Wed Oct 31, 2019  2011 Lactate is normal.  I am concerned for Covid given his decreased lymphocyte but procalcitonin is elevated.  No clear evidence of pneumonia.  Given his elevated LFTs will order ultrasound of right upper quadrant.   [PR]  2017 Denies any lower abdominal pain.  Patient feels improved.  Heart rate improved.   [PR]  2110 Patient reassessed.  Now not having any right upper quadrant pain is having some right lower quadrant pain will send for CT given his fever to evaluate for abscess appendicitis diverticulitis.    [PR]  2219 Case discussed with Dr. Claudine Mouton of general surgery.  Not felt to be a good surgical candidate at this time.  We will plan admission for antibiotics and evaluation of PERC drain.  Patient continues to improve clinically.   [PR]    Clinical Course User Index [PR] Willy Eddy, MD    The patient was evaluated in Emergency Department today for the symptoms described in the history of present illness. He/she was evaluated in the context of the global COVID-19 pandemic, which  necessitated consideration that the  patient might be at risk for infection with the SARS-CoV-2 virus that causes COVID-19. Institutional protocols and algorithms that pertain to the evaluation of patients at risk for COVID-19 are in a state of rapid change based on information released by regulatory bodies including the CDC and federal and state organizations. These policies and algorithms were followed during the patient's care in the ED.  As part of my medical decision making, I reviewed the following data within the electronic MEDICAL RECORD NUMBER Nursing notes reviewed and incorporated, Labs reviewed, notes from prior ED visits and [place] Controlled Substance Database   ____________________________________________   FINAL CLINICAL IMPRESSION(S) / ED DIAGNOSES  Final diagnoses:  Elevated LFTs  Sepsis without acute organ dysfunction, due to unspecified organism [facility])  Chronic cholecystitis      NEW MEDICATIONS STARTED DURING THIS VISIT:  New Prescriptions   No medications on file     Note:  This document was prepared using Dragon voice recognition software and may include unintentional dictation errors.    Willy Eddy, MD 10/31/19 2235

## 2019-10-31 NOTE — ED Notes (Signed)
Date and time results received: 10/31/19  Test: Glucose, bld Critical Value: 509  Name of Provider Notified: Dr Roxan Hockey, MD  No new orders at this time.

## 2019-11-01 ENCOUNTER — Inpatient Hospital Stay: Payer: Medicare Other

## 2019-11-01 DIAGNOSIS — B955 Unspecified streptococcus as the cause of diseases classified elsewhere: Secondary | ICD-10-CM

## 2019-11-01 DIAGNOSIS — Z978 Presence of other specified devices: Secondary | ICD-10-CM

## 2019-11-01 DIAGNOSIS — Z87891 Personal history of nicotine dependence: Secondary | ICD-10-CM

## 2019-11-01 DIAGNOSIS — K8010 Calculus of gallbladder with chronic cholecystitis without obstruction: Secondary | ICD-10-CM

## 2019-11-01 DIAGNOSIS — K750 Abscess of liver: Secondary | ICD-10-CM

## 2019-11-01 DIAGNOSIS — Z79899 Other long term (current) drug therapy: Secondary | ICD-10-CM

## 2019-11-01 DIAGNOSIS — K812 Acute cholecystitis with chronic cholecystitis: Secondary | ICD-10-CM | POA: Diagnosis not present

## 2019-11-01 DIAGNOSIS — B952 Enterococcus as the cause of diseases classified elsewhere: Secondary | ICD-10-CM

## 2019-11-01 DIAGNOSIS — I10 Essential (primary) hypertension: Secondary | ICD-10-CM

## 2019-11-01 DIAGNOSIS — Z87820 Personal history of traumatic brain injury: Secondary | ICD-10-CM

## 2019-11-01 DIAGNOSIS — R7881 Bacteremia: Secondary | ICD-10-CM

## 2019-11-01 DIAGNOSIS — E119 Type 2 diabetes mellitus without complications: Secondary | ICD-10-CM

## 2019-11-01 DIAGNOSIS — Z794 Long term (current) use of insulin: Secondary | ICD-10-CM

## 2019-11-01 DIAGNOSIS — K810 Acute cholecystitis: Secondary | ICD-10-CM

## 2019-11-01 LAB — BLOOD CULTURE ID PANEL (REFLEXED)
Acinetobacter baumannii: NOT DETECTED
Candida albicans: NOT DETECTED
Candida glabrata: NOT DETECTED
Candida krusei: NOT DETECTED
Candida parapsilosis: NOT DETECTED
Candida tropicalis: NOT DETECTED
Enterobacter cloacae complex: NOT DETECTED
Enterobacteriaceae species: NOT DETECTED
Enterococcus species: DETECTED — AB
Escherichia coli: NOT DETECTED
Haemophilus influenzae: NOT DETECTED
Klebsiella oxytoca: NOT DETECTED
Klebsiella pneumoniae: NOT DETECTED
Listeria monocytogenes: NOT DETECTED
Neisseria meningitidis: NOT DETECTED
Proteus species: NOT DETECTED
Pseudomonas aeruginosa: NOT DETECTED
Serratia marcescens: NOT DETECTED
Staphylococcus aureus (BCID): NOT DETECTED
Staphylococcus species: NOT DETECTED
Streptococcus agalactiae: NOT DETECTED
Streptococcus pneumoniae: NOT DETECTED
Streptococcus pyogenes: NOT DETECTED
Streptococcus species: DETECTED — AB
Vancomycin resistance: NOT DETECTED

## 2019-11-01 LAB — COMPREHENSIVE METABOLIC PANEL
ALT: 116 U/L — ABNORMAL HIGH (ref 0–44)
AST: 66 U/L — ABNORMAL HIGH (ref 15–41)
Albumin: 2.5 g/dL — ABNORMAL LOW (ref 3.5–5.0)
Alkaline Phosphatase: 222 U/L — ABNORMAL HIGH (ref 38–126)
Anion gap: 10 (ref 5–15)
BUN: 23 mg/dL (ref 8–23)
CO2: 26 mmol/L (ref 22–32)
Calcium: 9.1 mg/dL (ref 8.9–10.3)
Chloride: 101 mmol/L (ref 98–111)
Creatinine, Ser: 1.11 mg/dL (ref 0.61–1.24)
GFR calc Af Amer: >60 mL/min (ref >60)
GFR calc non Af Amer: >60 mL/min (ref >60)
Glucose, Bld: 355 mg/dL — ABNORMAL HIGH (ref 70–99)
Potassium: 4.2 mmol/L (ref 3.5–5.1)
Sodium: 137 mmol/L (ref 135–145)
Total Bilirubin: 0.9 mg/dL (ref 0.3–1.2)
Total Protein: 7.2 g/dL (ref 6.5–8.1)

## 2019-11-01 LAB — PROTIME-INR
INR: 1.4 — ABNORMAL HIGH (ref 0.8–1.2)
Prothrombin Time: 16.9 seconds — ABNORMAL HIGH (ref 11.4–15.2)

## 2019-11-01 LAB — GLUCOSE, CAPILLARY
Glucose-Capillary: 305 mg/dL — ABNORMAL HIGH (ref 70–99)
Glucose-Capillary: 418 mg/dL — ABNORMAL HIGH (ref 70–99)
Glucose-Capillary: 273 mg/dL — ABNORMAL HIGH (ref 70–99)
Glucose-Capillary: 216 mg/dL — ABNORMAL HIGH (ref 70–99)
Glucose-Capillary: 220 mg/dL — ABNORMAL HIGH (ref 70–99)

## 2019-11-01 LAB — CBC
HCT: 30.4 % — ABNORMAL LOW (ref 39.0–52.0)
Hemoglobin: 10.1 g/dL — ABNORMAL LOW (ref 13.0–17.0)
MCH: 29.2 pg (ref 26.0–34.0)
MCHC: 33.2 g/dL (ref 30.0–36.0)
MCV: 87.9 fL (ref 80.0–100.0)
Platelets: 270 10*3/uL (ref 150–400)
RBC: 3.46 MIL/uL — ABNORMAL LOW (ref 4.22–5.81)
RDW: 12.8 % (ref 11.5–15.5)
WBC: 16.3 10*3/uL — ABNORMAL HIGH (ref 4.0–10.5)
nRBC: 0 % (ref 0.0–0.2)

## 2019-11-01 MED ORDER — HYDROCODONE-ACETAMINOPHEN 5-325 MG PO TABS
1.0000 | ORAL_TABLET | ORAL | Status: DC | PRN
  Administered 2019-11-01 – 2019-11-03 (×6): 2 via ORAL
  Administered 2019-11-03: 1 via ORAL
  Administered 2019-11-04 – 2019-11-10 (×13): 2 via ORAL
  Administered 2019-11-11: 1 via ORAL
  Filled 2019-11-01 (×8): qty 2
  Filled 2019-11-01: qty 1
  Filled 2019-11-01 (×10): qty 2
  Filled 2019-11-01: qty 1
  Filled 2019-11-01 (×2): qty 2

## 2019-11-01 MED ORDER — ASPIRIN EC 81 MG PO TBEC
81.0000 mg | DELAYED_RELEASE_TABLET | Freq: Every day | ORAL | Status: DC
  Administered 2019-11-01 – 2019-11-10 (×9): 81 mg via ORAL
  Filled 2019-11-01 (×9): qty 1

## 2019-11-01 MED ORDER — SODIUM CHLORIDE 0.9 % IV SOLN
3.0000 g | Freq: Four times a day (QID) | INTRAVENOUS | Status: DC
  Administered 2019-11-01 – 2019-11-09 (×32): 3 g via INTRAVENOUS
  Filled 2019-11-01 (×4): qty 3
  Filled 2019-11-01 (×2): qty 8
  Filled 2019-11-01: qty 3
  Filled 2019-11-01: qty 8
  Filled 2019-11-01 (×2): qty 3
  Filled 2019-11-01: qty 8
  Filled 2019-11-01: qty 3
  Filled 2019-11-01 (×2): qty 8
  Filled 2019-11-01 (×7): qty 3
  Filled 2019-11-01: qty 8
  Filled 2019-11-01: qty 3
  Filled 2019-11-01: qty 8
  Filled 2019-11-01: qty 3
  Filled 2019-11-01: qty 8
  Filled 2019-11-01 (×3): qty 3
  Filled 2019-11-01: qty 8
  Filled 2019-11-01: qty 3
  Filled 2019-11-01: qty 8
  Filled 2019-11-01 (×4): qty 3

## 2019-11-01 MED ORDER — MIDAZOLAM HCL 2 MG/2ML IJ SOLN
INTRAMUSCULAR | Status: DC | PRN
  Administered 2019-11-01 (×2): 1 mg via INTRAVENOUS

## 2019-11-01 MED ORDER — CHLORHEXIDINE GLUCONATE CLOTH 2 % EX PADS
6.0000 | MEDICATED_PAD | Freq: Every day | CUTANEOUS | Status: DC
  Administered 2019-11-01 – 2019-11-04 (×4): 6 via TOPICAL

## 2019-11-01 MED ORDER — HEPARIN SODIUM (PORCINE) 5000 UNIT/ML IJ SOLN
5000.0000 [IU] | Freq: Three times a day (TID) | INTRAMUSCULAR | Status: DC
  Administered 2019-11-01 – 2019-11-05 (×13): 5000 [IU] via SUBCUTANEOUS
  Filled 2019-11-01 (×13): qty 1

## 2019-11-01 MED ORDER — NITROGLYCERIN 0.4 MG SL SUBL: 0.4000 mg | SUBLINGUAL_TABLET | SUBLINGUAL | Status: DC | PRN

## 2019-11-01 MED ORDER — FENTANYL CITRATE (PF) 100 MCG/2ML IJ SOLN
INTRAMUSCULAR | Status: AC
  Filled 2019-11-01: qty 2

## 2019-11-01 MED ORDER — INSULIN ASPART 100 UNIT/ML ~~LOC~~ SOLN
0.0000 [IU] | Freq: Three times a day (TID) | SUBCUTANEOUS | Status: DC
  Administered 2019-11-01: 8 [IU] via SUBCUTANEOUS
  Administered 2019-11-01: 11 [IU] via SUBCUTANEOUS
  Administered 2019-11-01 – 2019-11-03 (×4): 5 [IU] via SUBCUTANEOUS
  Administered 2019-11-03: 2 [IU] via SUBCUTANEOUS
  Administered 2019-11-03: 3 [IU] via SUBCUTANEOUS
  Administered 2019-11-04 (×3): 2 [IU] via SUBCUTANEOUS
  Administered 2019-11-05 – 2019-11-06 (×4): 3 [IU] via SUBCUTANEOUS
  Administered 2019-11-07: 2 [IU] via SUBCUTANEOUS
  Administered 2019-11-07 (×2): 3 [IU] via SUBCUTANEOUS
  Administered 2019-11-08: 2 [IU] via SUBCUTANEOUS
  Administered 2019-11-08: 3 [IU] via SUBCUTANEOUS
  Administered 2019-11-09 (×2): 5 [IU] via SUBCUTANEOUS
  Administered 2019-11-09: 2 [IU] via SUBCUTANEOUS
  Administered 2019-11-10 (×2): 5 [IU] via SUBCUTANEOUS
  Administered 2019-11-11: 2 [IU] via SUBCUTANEOUS
  Filled 2019-11-01 (×26): qty 1

## 2019-11-01 MED ORDER — SENNOSIDES-DOCUSATE SODIUM 8.6-50 MG PO TABS: 1.0000 | ORAL_TABLET | Freq: Every evening | ORAL | Status: DC | PRN

## 2019-11-01 MED ORDER — MIDAZOLAM HCL 5 MG/5ML IJ SOLN
INTRAMUSCULAR | Status: AC
  Filled 2019-11-01: qty 5

## 2019-11-01 MED ORDER — SODIUM CHLORIDE 0.9 % IV SOLN: INTRAVENOUS | Status: DC

## 2019-11-01 MED ORDER — FENTANYL CITRATE (PF) 100 MCG/2ML IJ SOLN
INTRAMUSCULAR | Status: DC | PRN
  Administered 2019-11-01 (×2): 50 ug via INTRAVENOUS

## 2019-11-01 MED ORDER — PRIMIDONE 50 MG PO TABS
50.0000 mg | ORAL_TABLET | Freq: Four times a day (QID) | ORAL | Status: DC
  Administered 2019-11-01 – 2019-11-10 (×38): 50 mg via ORAL
  Filled 2019-11-01 (×47): qty 1

## 2019-11-01 MED ORDER — INSULIN DETEMIR 100 UNIT/ML ~~LOC~~ SOLN
10.0000 [IU] | Freq: Every day | SUBCUTANEOUS | Status: DC
  Administered 2019-11-01 – 2019-11-10 (×9): 10 [IU] via SUBCUTANEOUS
  Filled 2019-11-01 (×12): qty 0.1

## 2019-11-01 MED ORDER — HYDROMORPHONE HCL 1 MG/ML IJ SOLN
0.5000 mg | INTRAMUSCULAR | Status: DC | PRN
  Administered 2019-11-01 – 2019-11-10 (×8): 0.5 mg via INTRAVENOUS
  Filled 2019-11-01: qty 0.5
  Filled 2019-11-01 (×3): qty 1
  Filled 2019-11-01 (×4): qty 0.5

## 2019-11-01 MED ORDER — AMLODIPINE BESYLATE 10 MG PO TABS
10.0000 mg | ORAL_TABLET | Freq: Every day | ORAL | Status: DC
  Administered 2019-11-01 – 2019-11-10 (×9): 10 mg via ORAL
  Filled 2019-11-01 (×5): qty 1
  Filled 2019-11-01: qty 2
  Filled 2019-11-01 (×3): qty 1

## 2019-11-01 MED ORDER — ATORVASTATIN CALCIUM 20 MG PO TABS
40.0000 mg | ORAL_TABLET | Freq: Every day | ORAL | Status: DC
  Administered 2019-11-01 – 2019-11-10 (×10): 40 mg via ORAL
  Filled 2019-11-01 (×10): qty 2

## 2019-11-01 NOTE — Progress Notes (Signed)
Patient clinically stable post procedure per Dr Miles Costain, report given to care nurse in ED with plan reviewed. purulent material removed with insertion. Stable post procedure.

## 2019-11-01 NOTE — Consult Note (Addendum)
NAME: Kyle Ritter Trinkle  DOB: 01/26/1946  MRN: [id number]  Date/Time: 11/01/2019 2:57 PM  REQUESTING PROVIDER: Luberta Robertsonhatterjee Subjective:  REASON FOR CONSULT: bacteremia ? Kyle Ritter Jaspers is a 74 y.o. yr male with a history of brain aneurysm, HTn, DM  Admitted with generalized weakness, poor appetitie for 3-4 weeks  fever for the past few days- He got tested for COVID and it was negative. As he was getting worse , his sister called EMS  and he was brought to the hospital. Vials in the ED temp of 102.3, 160/76, HR 98  CBC was 16, blood culture sent and he was started on Iv cefepime + flagyl as CT showed  massive dilatation of the gallbladder with  probable perforation superiorly with extension into the adjacent hepatic parenchyma with pockets of gas. This collection measures approximately 4.6 x 2.3 cm  There appears to be a 1.8 cm stone near the gallbladder neck. I am seeing the patient as the blood culture both sets positive for enterococcus and Streptococcus Past Medical History:  Diagnosis Date  . Brain aneurysm   . Diabetes mellitus without complication (HCC)   . Hypertension     Past Surgical History:  Procedure Laterality Date  . CEREBRAL ANEURYSM REPAIR      Social History   Socioeconomic History  . Marital status: Single    Spouse name: Not on file  . Number of children: Not on file  . Years of education: Not on file  . Highest education level: Not on file  Occupational History  . Not on file  Tobacco Use  . Smoking status: Former Games developermoker  . Smokeless tobacco: Never Used  Substance and Sexual Activity  . Alcohol use: No    Comment: quit alcohol 4 years ago.  . Drug use: Never  . Sexual activity: Not on file  Other Topics Concern  . Not on file  Social History Narrative  . Not on file   Social Determinants of Health   Financial Resource Strain:   . Difficulty of Paying Living Expenses:   Food Insecurity:   . Worried About Programme researcher, broadcasting/film/videounning Out of Food in the Last Year:   . Garment/textile technolog[place] Out  of Food in the Last Year:   Transportation Needs:   . Freight forwarderLack of Transportation (Medical):   Marland Kitchen. Lack of Transportation (Non-Medical):   Physical Activity:   . Days of Exercise per Week:   . Minutes of Exercise per Session:   Stress:   . Feeling of Stress :   Social Connections:   . Frequency of Communication with Friends and Family:   . Frequency of Social Gatherings with Friends and Family:   . Attends Religious Services:   . Active Member of Clubs or Organizations:   . Attends BankerClub or Organization Meetings:   Marland Kitchen. Marital Status:   Intimate Partner Violence:   . Fear of Current or Ex-Partner:   . Emotionally Abused:   Marland Kitchen. Physically Abused:   . Sexually Abused:     Family History  Problem Relation Age of Onset  . Diabetes Mother   . Diabetes Sister    No Known Allergies ? Current Facility-Administered Medications  Medication Dose Route Frequency Provider Last Rate Last Admin  . 0.9 %  sodium chloride infusion   Intravenous Continuous Acheampong, Genice RougePeter K, MD   Stopped at 11/01/19 [phone]  . amLODipine (NORVASC) tablet 10 mg  10 mg Oral Daily Acheampong, Genice RougePeter K, MD   10 mg at 11/01/19 0948  . Ampicillin-Sulbactam (UNASYN)  3 g in sodium chloride 0.9 % 100 mL IVPB  3 g Intravenous Q6H Lynn Ito, MD   Stopped at 11/01/19 1318  . aspirin EC tablet 81 mg  81 mg Oral Daily Acheampong, Genice Rouge, MD   81 mg at 11/01/19 0948  . atorvastatin (LIPITOR) tablet 40 mg  40 mg Oral q1800 Acheampong, Genice Rouge, MD      . fentaNYL (SUBLIMAZE) 100 MCG/2ML injection           . fentaNYL (SUBLIMAZE) injection   Intravenous PRN Berdine Dance, MD   50 mcg at 11/01/19 1456  . heparin injection 5,000 Units  5,000 Units Subcutaneous Q8H Acheampong, Genice Rouge, MD   Stopped at 11/01/19 1221  . HYDROcodone-acetaminophen (NORCO/VICODIN) 5-325 MG per tablet 1-2 tablet  1-2 tablet Oral Q4H PRN Acheampong, Genice Rouge, MD      . HYDROmorphone (DILAUDID) injection 0.5 mg  0.5 mg Intravenous Q4H PRN Acheampong, Genice Rouge, MD      . insulin aspart (novoLOG) injection 0-15 Units  0-15 Units Subcutaneous TID WC Acheampong, Genice Rouge, MD   8 Units at 11/01/19 1312  . insulin detemir (LEVEMIR) injection 10 Units  10 Units Subcutaneous QHS Acheampong, Genice Rouge, MD      . lactated ringers infusion   Intravenous Continuous Willy Eddy, MD 100 mL/hr at 11/01/19 1001 New Bag at 11/01/19 1001  . midazolam (VERSED) 5 MG/5ML injection           . midazolam (VERSED) injection   Intravenous PRN Berdine Dance, MD   1 mg at 11/01/19 1456  . nitroGLYCERIN (NITROSTAT) SL tablet 0.4 mg  0.4 mg Sublingual PRN Acheampong, Genice Rouge, MD      . primidone (MYSOLINE) tablet 50 mg  50 mg Oral QID Lilia Pro, MD   50 mg at 11/01/19 1055  . senna-docusate (Senokot-S) tablet 1 tablet  1 tablet Oral QHS PRN Acheampong, Genice Rouge, MD       Current Outpatient Medications  Medication Sig Dispense Refill  . amLODipine (NORVASC) 10 MG tablet Take 1 tablet (10 mg total) by mouth daily. 30 tablet 0  . atorvastatin (LIPITOR) 40 MG tablet Take 1 tablet (40 mg total) by mouth daily at 6 PM. 30 tablet 0  . metFORMIN (GLUCOPHAGE) 500 MG tablet Take 1 tablet (500 mg total) by mouth 2 (two) times daily with a meal. 60 tablet 0  . nitroGLYCERIN (NITROSTAT) 0.4 MG SL tablet Place 0.4 mg under the tongue as needed for chest pain.    . primidone (MYSOLINE) 50 MG tablet Take 50 mg by mouth 4 (four) times daily. Take 2 tablets by mouth once daily and 2 tablets in the evening    . aspirin EC 81 MG EC tablet Take 1 tablet (81 mg total) by mouth daily. 30 tablet 0     Abtx:  Anti-infectives (From admission, onward)   Start     Dose/Rate Route Frequency Ordered Stop   11/01/19 1200  Ampicillin-Sulbactam (UNASYN) 3 g in sodium chloride 0.9 % 100 mL IVPB     3 g 200 mL/hr over 30 Minutes Intravenous Every 6 hours 11/01/19 0920     11/01/19 0800  ceFEPIme (MAXIPIME) 2 g in sodium chloride 0.9 % 100 mL IVPB  Status:  Discontinued     2 g 200 mL/hr over  30 Minutes Intravenous Every 12 hours 10/31/19 2304 11/01/19 0920   11/01/19 0400  metroNIDAZOLE (FLAGYL) IVPB 500 mg  Status:  Discontinued  500 mg 100 mL/hr over 60 Minutes Intravenous Every 8 hours 10/31/19 2332 11/01/19 0920   10/31/19 2330  vancomycin (VANCOCIN) IVPB 1000 mg/200 mL premix     1,000 mg 200 mL/hr over 60 Minutes Intravenous  Once 10/31/19 2329 11/01/19 0148   10/31/19 2315  vancomycin (VANCOREADY) IVPB 1250 mg/250 mL  Status:  Discontinued     1,250 mg 166.7 mL/hr over 90 Minutes Intravenous Every 24 hours 10/31/19 2304 10/31/19 2329   10/31/19 1945  ceFEPIme (MAXIPIME) 2 g in sodium chloride 0.9 % 100 mL IVPB     2 g 200 mL/hr over 30 Minutes Intravenous  Once 10/31/19 1932 10/31/19 2118   10/31/19 1945  metroNIDAZOLE (FLAGYL) IVPB 500 mg     500 mg 100 mL/hr over 60 Minutes Intravenous  Once 10/31/19 1932 10/31/19 2118   10/31/19 1945  vancomycin (VANCOCIN) IVPB 1000 mg/200 mL premix     1,000 mg 200 mL/hr over 60 Minutes Intravenous  Once 10/31/19 1932 10/31/19 2118      REVIEW OF SYSTEMS:  Const: pt denies fever, negative chills, some weight loss Eyes: negative diplopia or visual changes, negative eye pain ENT: negative coryza, negative sore throat Resp: negative cough, hemoptysis, dyspnea Cards: negative for chest pain, palpitations, lower extremity edema GU: negative for frequency, dysuria and hematuria GI: poor appetitie Negative for abdominal pain, diarrhea, bleeding, constipation Skin: negative for rash and pruritus Heme: negative for easy bruising and gum/nose bleeding MS: generalized weakness Neurolo:negative for headaches, dizziness, vertigo, memory problems  Psych: negative for feelings of anxiety, depression  Endocrine: has diabetes Allergy/Immunology- negative for any medication or food allergies ?  Objective:  VITALS:  BP (!) 124/99 (BP Location: Left Arm)   Pulse 67   Temp 97.7 Ritter (36.5 C) (Oral)   Resp (!) 22   Ht 5' 11.5" (1.816  m)   Wt 82.5 kg   SpO2 100%   BMI 25.01 kg/m  PHYSICAL EXAM:  General: tired , cooperative, no distress, chronically ill pale Head: Normocephalic, without obvious abnormality, atraumatic. Eyes: Conjunctivae clear, anicteric sclerae. Pupils are equal ENT Nares normal. No drainage or sinus tenderness. Lips, mucosa, and tongue normal. No Thrush Neck: Supple, symmetrical, no adenopathy, thyroid: non tender no carotid bruit and no JVD. Back: No CVA tenderness. Lungs: Clear to auscultation bilaterally. No Wheezing or Rhonchi. No rales. Heart: Regular rate and rhythm, no murmur, rub or gallop. Abdomen: Soft,rt upper quadrant drain Extremities: atraumatic, no cyanosis. No edema. No clubbing Skin: No rashes or lesions. Or bruising Lymph: Cervical, supraclavicular normal. Neurologic: Grossly non-focal Pertinent Labs Lab Results CBC    Component Value Date/Time   WBC 16.3 (H) 11/01/2019 0953   RBC 3.46 (L) 11/01/2019 0953   HGB 10.1 (L) 11/01/2019 0953   HCT 30.4 (L) 11/01/2019 0953   PLT 270 11/01/2019 0953   MCV 87.9 11/01/2019 0953   MCH 29.2 11/01/2019 0953   MCHC 33.2 11/01/2019 0953   RDW 12.8 11/01/2019 0953   LYMPHSABS 0.4 (L) 10/31/2019 1830   MONOABS 1.3 (H) 10/31/2019 1830   EOSABS 0.0 10/31/2019 1830   BASOSABS 0.0 10/31/2019 1830    CMP Latest Ref Rng & Units 11/01/2019 10/31/2019 09/22/2015  Glucose 70 - 99 mg/dL 355(H) 509(HH) 352(H)  BUN 8 - 23 mg/dL 23 28(H) 17  Creatinine 0.61 - 1.24 mg/dL 1.11 1.46(H) 0.85  Sodium 135 - 145 mmol/L 137 126(L) 137  Potassium 3.5 - 5.1 mmol/L 4.2 4.7 4.4  Chloride 98 - 111 mmol/L 101 91(L)  101  CO2 22 - 32 mmol/L 26 24 28   Calcium 8.9 - 10.3 mg/dL 9.1 9.1  Total Protein 6.5 - 8.1 g/dL 7.2 7.6 -  Total Bilirubin 0.3 - 1.2 mg/dL 0.9 1.0 -  Alkaline Phos 38 - 126 U/L 222(H) 242(H) -  AST 15 - 41 U/L 66(H) 123(H) -  ALT 0 - 44 U/L 116(H) 146(H) -      Microbiology: Recent Results (from the past 240 hour(s))  Blood  Culture (routine x 2)     Status: None (Preliminary result)   Collection Time: 10/31/19  6:30 PM   Specimen: BLOOD  Result Value Ref Range Status   Specimen Description   Final    BLOOD BLOOD RIGHT HAND Performed at [facility], [address], [place], [place]    Special Requests   Final    BOTTLES DRAWN AEROBIC AND ANAEROBIC Blood Culture adequate volume Performed at [facility], [address] [place], [place]    Culture  Setup Time   Final    GRAM POSITIVE COCCI IN BOTH AEROBIC AND ANAEROBIC BOTTLES CRITICAL RESULT CALLED TO, READ BACK BY AND VERIFIED WITH: SHERRIE THOMPSON @0438  11/01/19 AKT Performed at [facility], [address] [place], [address] [place]    Culture GRAM POSITIVE COCCI  Final   Report Status PENDING  Incomplete  Blood Culture (routine x 2)     Status: None (Preliminary result)   Collection Time: 10/31/19  6:30 PM   Specimen: BLOOD  Result Value Ref Range Status   Specimen Description BLOOD LEFT ANTECUBITAL  Final   Special Requests   Final    BOTTLES DRAWN AEROBIC AND ANAEROBIC Blood Culture results may not be optimal due to an inadequate volume of blood received in culture bottles   Culture  Setup Time   Final    GRAM POSITIVE COCCI IN BOTH AEROBIC AND ANAEROBIC BOTTLES CRITICAL VALUE NOTED.  VALUE IS CONSISTENT WITH PREVIOUSLY REPORTED AND CALLED VALUE. Performed at [facility], [address] [place], [address] [place]    Culture [facility] POSITIVE COCCI  Final   Report Status PENDING  Incomplete  Blood Culture ID Panel (Reflexed)     Status: Abnormal   Collection Time: 10/31/19  6:30 PM  Result Value Ref Range Status   Enterococcus species DETECTED (A) NOT DETECTED Final    Comment: CRITICAL RESULT CALLED TO, READ BACK BY AND VERIFIED WITH: sherrie thompson @0438  11/01/19 akt    Vancomycin resistance NOT DETECTED NOT DETECTED Final   Listeria monocytogenes NOT DETECTED NOT DETECTED Final    Staphylococcus species NOT DETECTED NOT DETECTED Final   Staphylococcus aureus (BCID) NOT DETECTED NOT DETECTED Final   Streptococcus species DETECTED (A) NOT DETECTED Final    Comment: Not Enterococcus species, Streptococcus agalactiae, Streptococcus pyogenes, or Streptococcus pneumoniae. CRITICAL RESULT CALLED TO, READ BACK BY AND VERIFIED WITH: SHERRIE THOMPSON @0438  11/01/19 AKT    Streptococcus agalactiae NOT DETECTED NOT DETECTED Final   Streptococcus pneumoniae NOT DETECTED NOT DETECTED Final   Streptococcus pyogenes NOT DETECTED NOT DETECTED Final   Acinetobacter baumannii NOT DETECTED NOT DETECTED Final   Enterobacteriaceae species NOT DETECTED NOT DETECTED Final   Enterobacter cloacae complex NOT DETECTED NOT DETECTED Final   Escherichia coli NOT DETECTED NOT DETECTED Final   Klebsiella oxytoca NOT DETECTED NOT DETECTED Final   Klebsiella pneumoniae NOT DETECTED NOT DETECTED Final   Proteus species NOT DETECTED NOT DETECTED Final   Serratia marcescens  NOT DETECTED NOT DETECTED Final   Haemophilus influenzae NOT DETECTED NOT DETECTED Final   Neisseria meningitidis NOT DETECTED NOT DETECTED Final   Pseudomonas aeruginosa NOT DETECTED NOT DETECTED Final   Candida albicans NOT DETECTED NOT DETECTED Final   Candida glabrata NOT DETECTED NOT DETECTED Final   Candida krusei NOT DETECTED NOT DETECTED Final   Candida parapsilosis NOT DETECTED NOT DETECTED Final   Candida tropicalis NOT DETECTED NOT DETECTED Final    Comment: Performed at [facility], [address] [place], [place] [address]    IMAGING RESULTS: CT abdomen Dilated GB with perforation and hepatic abscess with stone at the GB neck  I have personally reviewed the films ? Impression/Recommendation ? ?Bacteremia due to enterococcus and streptococcus from the gall bladder Cholecystitis , with dilated GB due to stone at the neck with  perforation and liver abscess-underwent cholecystostomy-purulent  drainage Change cefepime and flagyl to unasyn Await bile culture' repeat blood culture No hardware  DM-on sliding scale  HTn- on amlodipine ? ___________________________________________________ Discussed with patient, and his nurse

## 2019-11-01 NOTE — Consult Note (Addendum)
[facility] SURGICAL CONSULTATION NOTE (initial) - cpt: 56213   HISTORY OF PRESENT ILLNESS (HPI):  74 y.o. male presented to [facility] ED yesterday for evaluation of generalized fatigue and weakness. Patient reports that about over the course of the last few weeks he has had generalized fatigue, weakness, and poor PO intake. He was also febrile on arrival to 102.2. He is unsure the cause of these. No SOB, CP, cough, congestion, urinary changes, or bowel changes. Denied any abdominal pain. No previous abdominal surgeries. Work up in the ED was concerning for hyperglycemia to 509, AKI with sCr of 1.46 (was 0.85), leukocytosis to 14K, and RUQ [place] and CT Abdomen/Pelvis both concerning for massively distended gallbladder. He was admitted to medicine service for further work up.   Surgery is consulted by emergency medicine physician Dr. Willy Eddy in this context for evaluation and management of cholecystitis.   PAST MEDICAL HISTORY (PMH):  Past Medical History:  Diagnosis Date  . Brain aneurysm   . Diabetes mellitus without complication (HCC)   . Hypertension      PAST SURGICAL HISTORY (PSH):  Past Surgical History:  Procedure Laterality Date  . CEREBRAL ANEURYSM REPAIR       MEDICATIONS:  Prior to Admission medications   Medication Sig Start Date End Date Taking? Authorizing Provider  amLODipine (NORVASC) 10 MG tablet Take 1 tablet (10 mg total) by mouth daily. 09/23/15  Yes Delfino Lovett, MD  atorvastatin (LIPITOR) 40 MG tablet Take 1 tablet (40 mg total) by mouth daily at 6 PM. 09/23/15  Yes Delfino Lovett, MD  metFORMIN (GLUCOPHAGE) 500 MG tablet Take 1 tablet (500 mg total) by mouth 2 (two) times daily with a meal. 09/23/15  Yes Delfino Lovett, MD  nitroGLYCERIN (NITROSTAT) 0.4 MG SL tablet Place 0.4 mg under the tongue as needed for chest pain.   Yes [provider]  primidone (MYSOLINE) 50 MG tablet Take 50 mg by mouth 4 (four) times daily. Take 2 tablets by mouth once  daily and 2 tablets in the evening   Yes [provider]  aspirin EC 81 MG EC tablet Take 1 tablet (81 mg total) by mouth daily. 09/23/15   Delfino Lovett, MD     ALLERGIES:  No Known Allergies   SOCIAL HISTORY:  Social History   Socioeconomic History  . Marital status: Single    Spouse name: Not on file  . Number of children: Not on file  . Years of education: Not on file  . Highest education level: Not on file  Occupational History  . Not on file  Tobacco Use  . Smoking status: Former Games developer  . Smokeless tobacco: Never Used  Substance and Sexual Activity  . Alcohol use: No    Comment: quit alcohol 4 years ago.  . Drug use: Never  . Sexual activity: Not on file  Other Topics Concern  . Not on file  Social History Narrative  . Not on file   Social Determinants of Health   Financial Resource Strain:   . Difficulty of Paying Living Expenses:   Food Insecurity:   . Worried About Programme researcher, broadcasting/film/video in the Last Year:   . Barista in the Last Year:   Transportation Needs:   . Freight forwarder (Medical):   Marland Kitchen Lack of Transportation (Non-Medical):   Physical Activity:   . Days of Exercise per Week:   . Minutes of Exercise per Session:   Stress:   . Feeling  of Stress :   Social Connections:   . Frequency of Communication with Friends and Family:   . Frequency of Social Gatherings with Friends and Family:   . Attends Religious Services:   . Active Member of Clubs or Organizations:   . Attends Archivist Meetings:   Marland Kitchen Marital Status:   Intimate Partner Violence:   . Fear of Current or Ex-Partner:   . Emotionally Abused:   Marland Kitchen Physically Abused:   . Sexually Abused:      FAMILY HISTORY:  Family History  Problem Relation Age of Onset  . Diabetes Mother   . Diabetes Sister       REVIEW OF SYSTEMS:  Review of Systems  Constitutional: Positive for chills, fever and malaise/fatigue.       + Decreased appetite  HENT: Negative for  congestion and sore throat.   Respiratory: Negative for cough and shortness of breath.   Cardiovascular: Negative for chest pain and palpitations.  Gastrointestinal: Negative for abdominal pain, blood in stool, constipation, diarrhea, nausea and vomiting.    VITAL SIGNS:  Temp:  [97.5 F (36.4 C)-102.2 F (39 C)] 97.5 F (36.4 C) (04/08 0000) Pulse Rate:  [54-131] 54 (04/08 0500) Resp:  [11-25] 11 (04/08 0500) BP: (93-160)/(55-96) 106/59 (04/08 0500) SpO2:  [95 %-100 %] 100 % (04/08 0500) Weight:  [82.5 kg] 82.5 kg (04/07 1825)     Height: 5' 11.5" (181.6 cm) Weight: 82.5 kg BMI (Calculated): 25.02   INTAKE/OUTPUT:  04/07 0701 - 04/08 0700 In: 1000 [IV Piggyback:1000] Out: -   PHYSICAL EXAM:  Physical Exam   Labs:  CBC Latest Ref Rng & Units 10/31/2019 09/23/2015 09/22/2015  WBC 4.0 - 10.5 K/uL 14.5(H) 7.2 5.6  Hemoglobin 13.0 - 17.0 g/dL 10.3(L) 13.7 14.7  Hematocrit 39.0 - 52.0 % 30.9(L) 39.8(L) 43.3  Platelets 150 - 400 K/uL 297 177 182   CMP Latest Ref Rng & Units 10/31/2019 09/22/2015  Glucose 70 - 99 mg/dL 509(HH) 352(H)  BUN 8 - 23 mg/dL 28(H) 17  Creatinine 0.61 - 1.24 mg/dL 1.46(H) 0.85  Sodium 135 - 145 mmol/L 126(L) 137  Potassium 3.5 - 5.1 mmol/L 4.7 4.4  Chloride 98 - 111 mmol/L 91(L) 101  CO2 22 - 32 mmol/L 24 28  Calcium 8.9 - 10.3 mg/dL 9.1 10.1  Total Protein 6.5 - 8.1 g/dL 7.6 -  Total Bilirubin 0.3 - 1.2 mg/dL 1.0 -  Alkaline Phos 38 - 126 U/L 242(H) -  AST 15 - 41 U/L 123(H) -  ALT 0 - 44 U/L 146(H) -     Imaging studies:   CT Abdomen/Pelvis (10/31/2019) personally reviewed with markedly distended gallbladder, and radiologist report reviewed: IMPRESSION: 1. Massive dilatation of the gallbladder with findings most consistent with perforated cholecystitis. There appears to be a developing intrahepatic abscess as detailed above. 2. Rectosigmoid diverticulosis without acute inflammation. 3. Normal appendix in the right lower quadrant.   RUQ [place]  (10/31/2019) personally reviewed markedly distended gallbladder, and radiologist report reviewed:  IMPRESSION: 1. Abnormal appearance of the gallbladder which is markedly distended/hydropic and filled with heterogeneous echogenic debris. This may represent combination of stones and sludge. Diffuse gallbladder wall thickening. 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 No internal vascularity or hyperemia by ultrasound. Recommend further evaluation with cross-sectional imaging. MRCP would be the imaging modality of choice if patient is able to tolerate breath hold technique. 2. Heterogeneous increased hepatic echogenicity most consistent with steatosis. 3. No common bile duct dilatation.   Assessment/Plan: (ICD-10's:  K81.0) 74 y.o. male with failure to thrive, fever, chills, and markedly distended gallbladder on imaging with pericholecystic fluid concerning for cholecystitis and ? possible GB rupture, complicated by pertinent comorbidities including multiple chronic morbid conditions.   - Admit to medicine service  - Unfortunately patient is not a good surgical candidate, we will consult with IR for evaluation for percutaneous cholecystostomy tube placement.   - NPO  - IVF resuscitation  - Continue IV ABx (Cefepime + Flagyl); follow up BCx  - pain control prn; antiemetics prn  - monitor abdominal examination  - further management per primary service; we will follow  - hold DVT prophylaxis if possible for potential IR procedure  All of the above findings and recommendations were discussed with the patient, and all of patient's questions were answered to his expressed satisfaction.  Thank you for the opportunity to participate in this patient's care.   -- Lynden Oxford, PA-C [facility] 11/01/2019, 7:35 AM [phone] M-F: 7am - 4pm

## 2019-11-01 NOTE — Procedures (Signed)
Interventional Radiology Procedure Note  Procedure: s/p CT perc cholecystostomy  Complications: None  Estimated Blood Loss: min  Findings: 450cc blood tinged pus cx sent

## 2019-11-01 NOTE — Progress Notes (Signed)
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  Work-up in the ED reveals fever, leukocytosis and CT shows acute on chronic cholecystitis with massively enlarged gallbladder and possible hepatic abscess.  General surgery was consulted and recommended IR drainage of abscess with percutaneous cholecystotomy.   Subjective:  Patient states he feels much better this morning than he did yesterday.  Much less weak.  Denies any abdominal pain.  His wife also notes that he looks much better.   Objective: Vitals:   11/01/19 1453 11/01/19 1500 11/01/19 1528 11/01/19 1630  BP: (!) 124/99 (!) 158/67 135/64   Pulse: 67 65 78   Resp: (!) 22 15 16    Temp:    97.7 F (36.5 C)  TempSrc:    Oral  SpO2: 100% 99% 95%   Weight:      Height:        Intake/Output Summary (Last 24 hours) at 11/01/2019 1833 Last data filed at 11/01/2019 1318 Gross per 24 hour  Intake 1305.03 ml  Output --  Net 1305.03 ml   Filed Weights   10/31/19 1825  Weight: 82.5 kg     Exam:  General: Tired appearing man lying in stretcher with attentive wife at bedside Eyes: sclera anicteric, conjuctiva mild injection bilaterally CVS: S1-S2, regular  Respiratory:  decreased air entry bilaterally secondary to decreased inspiratory effort, rales at bases  GI: She does have bowel sounds.  Abdomen is firm but not hard.  No real rebound tenderness noted.  Unable to elicit Murphy's. LE: No edema.  Neuro: A/O x 3, Moving all extremities equally with normal strength, CN 3-12 intact, grossly nonfocal.  Psych: patient is logical and coherent, judgement and insight appear normal, mood and affect appropriate to situation.   Data Reviewed: Assessment & Plan:   Gallbladder  abscess And underwent cholecystotomy today and they drained 450 cc blood-tinged pus Patient tolerated procedure well Cultures are pending  Sepsis secondary to enterococcal bacteremia Pressure is responded well to IV fluids and broad-spectrum antibiotics. Pharmacy discussed with Dr. 12/31/19 who recommended consolidating antibiotics to Unasyn day #1  DM2 SSI 0 to 15 units AC at bedtime Metformin being held  AKI Creatinine much improved with treatment of infection and hydration  Hyponatremia Sodium normalized with hydration.    DVT prophylaxis: Lovenox Code Status: Full Family Communication: Wife was at bedside throughout Disposition Plan:   Patient is from: Home  Anticipated Discharge Location: Home  Barriers to Discharge: Patient still acutely ill  Is patient medically stable for Discharge: No   Consultants:  General surgery  IR  Procedures:  Percutaneous cholecystotomy on 11/01/2019  Antimicrobials: Unasyn day #1   Basic Metabolic Panel: Recent Labs  Lab 10/31/19 1830 11/01/19 0953  NA 126* 137  K 4.7 4.2  CL 91* 101  CO2 24 26  GLUCOSE 509* 355*  BUN 28* 23  CREATININE 1.46* 1.11  CALCIUM 9.1 9.1   Liver Function Tests: Recent Labs  Lab 10/31/19 1830 11/01/19 0953  AST 123* 66*  ALT 146* 116*  ALKPHOS 242* 222*  BILITOT 1.0 0.9  PROT 7.6 7.2  ALBUMIN 2.7* 2.5*   No results for input(s): LIPASE, AMYLASE in the last 168 hours. No results for input(s): AMMONIA in the last 168 hours.  CBC: Recent Labs  Lab 10/31/19 1830 11/01/19 0953  WBC 14.5* 16.3*  NEUTROABS 12.5*  --   HGB 10.3* 10.1*  HCT 30.9* 30.4*  MCV 86.3 87.9  PLT 297 270   Cardiac Enzymes: No results for input(s): CKTOTAL, CKMB, CKMBINDEX, TROPONINI in the last 168 hours. BNP (last 3 results) No results for input(s): PROBNP in the last 8760 hours. CBG: Recent Labs  Lab 11/01/19 0930 11/01/19 1126 11/01/19 1247 11/01/19 1612  GLUCAP 305* 418* 273* 216*     Recent Results (from the past 240 hour(s))  Blood Culture (routine x 2)     Status: None (Preliminary result)   Collection Time: 10/31/19  6:30 PM   Specimen: BLOOD  Result Value Ref Range Status   Specimen Description   Final    BLOOD BLOOD RIGHT HAND Performed at [facility], [address], [place], [place] [address]    Special Requests   Final    BOTTLES DRAWN AEROBIC AND ANAEROBIC Blood Culture adequate volume Performed at [facility], [address], [place], [place] [address]    Culture  Setup Time   Final    GRAM POSITIVE COCCI IN BOTH AEROBIC AND ANAEROBIC BOTTLES CRITICAL RESULT CALLED TO, READ BACK BY AND VERIFIED WITH: SHERRIE THOMPSON @0438  11/01/19 AKT Performed at [facility], [address], [place], [place]    Culture GRAM POSITIVE COCCI  Final   Report Status PENDING  Incomplete  Blood Culture (routine x 2)     Status: None (Preliminary result)   Collection Time: 10/31/19  6:30 PM   Specimen: BLOOD  Result Value Ref Range Status   Specimen Description BLOOD LEFT ANTECUBITAL  Final   Special Requests   Final    BOTTLES DRAWN AEROBIC AND ANAEROBIC Blood Culture results may not be optimal due to an inadequate volume of blood received in culture bottles   Culture  Setup Time   Final    GRAM POSITIVE COCCI IN BOTH AEROBIC AND ANAEROBIC BOTTLES CRITICAL VALUE NOTED.  VALUE IS CONSISTENT WITH PREVIOUSLY REPORTED AND CALLED VALUE. Performed at [facility], [address] [place], [place]    Culture [facility] POSITIVE COCCI  Final   Report Status PENDING  Incomplete  Blood Culture ID Panel (Reflexed)     Status: Abnormal   Collection Time: 10/31/19  6:30 PM  Result Value Ref Range Status   Enterococcus species DETECTED (A) NOT DETECTED Final    Comment: CRITICAL RESULT CALLED TO, READ BACK BY AND VERIFIED WITH: sherrie thompson @0438  11/01/19 akt    Vancomycin resistance NOT DETECTED NOT DETECTED Final    Listeria monocytogenes NOT DETECTED NOT DETECTED Final   Staphylococcus species NOT DETECTED NOT DETECTED Final   Staphylococcus aureus (BCID) NOT DETECTED NOT DETECTED Final   Streptococcus species DETECTED (A) NOT DETECTED Final    Comment: Not Enterococcus species, Streptococcus agalactiae, Streptococcus pyogenes, or Streptococcus pneumoniae. CRITICAL RESULT CALLED TO, READ BACK BY AND VERIFIED WITH: SHERRIE THOMPSON @0438  11/01/19 AKT    Streptococcus agalactiae NOT DETECTED NOT DETECTED Final   Streptococcus pneumoniae NOT DETECTED NOT DETECTED Final   Streptococcus pyogenes NOT DETECTED NOT DETECTED Final   Acinetobacter baumannii NOT DETECTED NOT DETECTED Final   Enterobacteriaceae species NOT DETECTED NOT DETECTED Final   Enterobacter cloacae complex NOT DETECTED NOT DETECTED Final   Escherichia coli NOT DETECTED NOT DETECTED Final   Klebsiella oxytoca NOT DETECTED NOT DETECTED Final   Klebsiella pneumoniae NOT DETECTED NOT  DETECTED Final   Proteus species NOT DETECTED NOT DETECTED Final   Serratia marcescens NOT DETECTED NOT DETECTED Final   Haemophilus influenzae NOT DETECTED NOT DETECTED Final   Neisseria meningitidis NOT DETECTED NOT DETECTED Final   Pseudomonas aeruginosa NOT DETECTED NOT DETECTED Final   Candida albicans NOT DETECTED NOT DETECTED Final   Candida glabrata NOT DETECTED NOT DETECTED Final   Candida krusei NOT DETECTED NOT DETECTED Final   Candida parapsilosis NOT DETECTED NOT DETECTED Final   Candida tropicalis NOT DETECTED NOT DETECTED Final    Comment: Performed at [facility], [address], [place], [place] [address]      Studies: CT ABDOMEN PELVIS W CONTRAST  Result Date: 10/31/2019 CLINICAL DATA:  Right lower quadrant pain. Concern for acute appendicitis. EXAM: CT ABDOMEN AND PELVIS WITH CONTRAST TECHNIQUE: Multidetector CT imaging of the abdomen and pelvis was performed using the standard protocol following bolus administration of  intravenous contrast. CONTRAST:  OMNIPAQUE IOHEXOL 300 MG/ML  SOLN COMPARISON:  None. FINDINGS: Lower chest: The lung bases are clear. The heart size is normal. Coronary artery calcifications are noted. Hepatobiliary: The liver is normal. There is massive dilatation of the gallbladder. The gallbladder wall appears thickened and is irregular. There is a probable perforation superiorly as evidence by extension into the adjacent hepatic parenchyma with pockets of gas. This collection measures approximately 4.6 x 2.3 cm (axial series 5, image 23). There appears to be a 1.8 cm stone near the gallbladder neck.There is some mild intrahepatic ductal dilatation. The common bile duct is difficult to fully evaluate but does not appear to be significantly dilated. Pancreas: Normal contours without ductal dilatation. No peripancreatic fluid collection. Spleen: Unremarkable. Adrenals/Urinary Tract: --Adrenal glands: Unremarkable. --Right kidney/ureter: No hydronephrosis or radiopaque kidney stones. --Left kidney/ureter: No hydronephrosis or radiopaque kidney stones. --Urinary bladder: Unremarkable. Stomach/Bowel: --Stomach/Duodenum: No hiatal hernia or other gastric abnormality. Normal duodenal course and caliber. --Small bowel: Unremarkable. --Colon: Rectosigmoid diverticulosis without acute inflammation. --Appendix: Normal. Vascular/Lymphatic: Atherosclerotic calcification is present within the non-aneurysmal abdominal aorta, without hemodynamically significant stenosis. --No retroperitoneal lymphadenopathy. --No mesenteric lymphadenopathy. --No pelvic or inguinal lymphadenopathy. Reproductive: Unremarkable Other: No ascites or free air. Bilateral fat containing inguinal hernias are noted. Musculoskeletal. No acute displaced fractures. IMPRESSION: 1. Massive dilatation of the gallbladder with findings most consistent with perforated cholecystitis. There appears to be a developing intrahepatic abscess as detailed above. 2.  Rectosigmoid diverticulosis without acute inflammation. 3. Normal appendix in the right lower quadrant. 4. Aortic Atherosclerosis (ICD10-I70.0). Electronically Signed   By: Katherine Mantle M.D.   On: 10/31/2019 22:06   DG Chest Port 1 View  Result Date: 10/31/2019 CLINICAL DATA:  Sepsis EXAM: PORTABLE CHEST 1 VIEW COMPARISON:  09/22/2015 FINDINGS: Low lung volumes. No confluent opacities or effusions. Heart is normal size. No acute bony abnormality. IMPRESSION: Low lung volumes.  No acute cardiopulmonary disease. Electronically Signed   By: Charlett Nose M.D.   On: 10/31/2019 19:11   CT IMAGE GUIDED DRAINAGE BY PERCUTANEOUS CATHETER  Result Date: 11/01/2019 INDICATION: Perforated calculus cholecystitis EXAM: CT GUIDED 10 FRENCH CHOLECYSTOSTOMY Date:  11/01/2019 11/01/2019 3:27 pm Radiologist:  Judie Petit. Ruel Favors, MD Guidance:  CT FLUOROSCOPY TIME:  None. MEDICATIONS: 1% lidocaine local ANESTHESIA/SEDATION: Moderate (conscious) sedation was employed during this procedure. A total of Versed 2.0 mg and Fentanyl 100 mcg was administered intravenously. Moderate Sedation Time: 12 minutes. The patient's level of consciousness and vital signs were monitored continuously by radiology nursing throughout the procedure under my direct  supervision. CONTRAST:  None. COMPLICATIONS: None immediate. PROCEDURE: Informed consent was obtained from the patient following explanation of the procedure, risks, benefits and alternatives. The patient understands, agrees and consents for the procedure. All questions were addressed. A time out was performed. Maximal barrier sterile technique utilized including caps, mask, sterile gowns, sterile gloves, large sterile drape, hand hygiene, and ChloraPrep. Previous imaging reviewed. Patient positioned supine. Noncontrast localization CT performed. The perforated distended gallbladder/abscess was localized. Overlying skin marked for a lower intercostal approach. Under sterile conditions and local  anesthesia, an 18 gauge 10 cm access was advanced percutaneously via a transhepatic window into the collection. Needle position confirmed with CT. Syringe aspiration yielded purulent fluid. Sample sent for culture. Guidewire inserted followed by tract dilatation insert a 10 French drain. Drain catheter position confirmed with CT. Syringe aspiration yielded 400 cc bloody exudative fluid. Catheter secured with a prolene suture and connected to external gravity drainage bag. Sterile dressing applied. No immediate complication. Patient tolerated the procedure well. IMPRESSION: Successful CT-guided cholecystostomy as above. Electronically Signed   By: Jerilynn Mages.  Shick M.D.   On: 11/01/2019 15:43   US ABDOMEN LIMITED RUQ  Result Date: 10/31/2019 CLINICAL DATA:  Elevated LFTs. EXAM: ULTRASOUND ABDOMEN LIMITED RIGHT UPPER QUADRANT COMPARISON:  None. FINDINGS: Gallbladder: Abnormal, markedly distended with large amount of heterogeneous internal echogenic debris likely combination of stones and sludge. No evidence of internal vascularity. Diffuse gallbladder wall thickening measuring up to 7 mm. Small amount of pericholecystic fluid. No sonographic Murphy sign noted by sonographer. Common bile duct: Diameter: Not well-defined, tentatively visualized at 2 mm. Liver: Heterogeneously increased echogenicity. No evidence of focal lesion. No definite capsular nodularity. Portal vein is patent on color Doppler imaging with normal direction of blood flow towards the liver. Other: None. IMPRESSION: 1. Abnormal appearance of the gallbladder which is markedly distended/hydropic and filled with heterogeneous echogenic debris. This may represent combination of stones and sludge. Diffuse gallbladder wall thickening. 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 No internal vascularity or hyperemia by ultrasound. Recommend further evaluation with cross-sectional imaging. MRCP would be the imaging modality of choice if patient is able to  tolerate breath hold technique. 2. Heterogeneous increased hepatic echogenicity most consistent with steatosis. 3. No common bile duct dilatation. Electronically Signed   By: Keith Rake M.D.   On: 10/31/2019 20:59     Scheduled Meds: . amLODipine  10 mg Oral Daily  . aspirin EC  81 mg Oral Daily  . atorvastatin  40 mg Oral q1800  . Chlorhexidine Gluconate Cloth  6 each Topical Daily  . heparin  5,000 Units Subcutaneous Q8H  . insulin aspart  0-15 Units Subcutaneous TID WC  . insulin detemir  10 Units Subcutaneous QHS  . primidone  50 mg Oral QID   Continuous Infusions: . sodium chloride Stopped (11/01/19 0958)  . ampicillin-sulbactam (UNASYN) IV 3 g (11/01/19 1813)  . lactated ringers 100 mL/hr at 11/01/19 1814    Active Problems:   Acute on chronic cholecystitis     Vashti Hey, [facility]  If 7PM-7AM, please contact night-coverage [email or web address] Password [facility] 11/01/2019, 6:33 PM    LOS: 1 day

## 2019-11-01 NOTE — ED Notes (Signed)
Pt bp is trending down. Will continue to monitor and advised admitting md if trend continues.

## 2019-11-01 NOTE — Progress Notes (Addendum)
PHARMACY - PHYSICIAN COMMUNICATION CRITICAL VALUE ALERT - BLOOD CULTURE IDENTIFICATION (BCID)  Kyle Ritter is an 74 y.o. male who presented to [facility] on 10/31/2019 with a chief complaint of abdominal pain, weakness, fever  Assessment:  CT reveals dilated gallbladder and possible early liver abscess.  Blood cultures with GPC and BCID detects enterococcus and streptococcus species  Name of physician (or Provider) Contacted: Drs Rivka Safer and Luberta Robertson  Current antibiotics: cefepime and metronidazole  Changes to prescribed antibiotics recommended:  Recommendations accepted by provider - change to Unasyn  Results for orders placed or performed during the hospital encounter of 10/31/19  Blood Culture ID Panel (Reflexed) (Collected: 10/31/2019  6:30 PM)  Result Value Ref Range   Enterococcus species DETECTED (A) NOT DETECTED   Vancomycin resistance NOT DETECTED NOT DETECTED   Listeria monocytogenes NOT DETECTED NOT DETECTED   Staphylococcus species NOT DETECTED NOT DETECTED   Staphylococcus aureus (BCID) NOT DETECTED NOT DETECTED   Streptococcus species DETECTED (A) NOT DETECTED   Streptococcus agalactiae NOT DETECTED NOT DETECTED   Streptococcus pneumoniae NOT DETECTED NOT DETECTED   Streptococcus pyogenes NOT DETECTED NOT DETECTED   Acinetobacter baumannii NOT DETECTED NOT DETECTED   Enterobacteriaceae species NOT DETECTED NOT DETECTED   Enterobacter cloacae complex NOT DETECTED NOT DETECTED   Escherichia coli NOT DETECTED NOT DETECTED   Klebsiella oxytoca NOT DETECTED NOT DETECTED   Klebsiella pneumoniae NOT DETECTED NOT DETECTED   Proteus species NOT DETECTED NOT DETECTED   Serratia marcescens NOT DETECTED NOT DETECTED   Haemophilus influenzae NOT DETECTED NOT DETECTED   Neisseria meningitidis NOT DETECTED NOT DETECTED   Pseudomonas aeruginosa NOT DETECTED NOT DETECTED   Candida albicans NOT DETECTED NOT DETECTED   Candida glabrata NOT DETECTED NOT DETECTED   Candida krusei  NOT DETECTED NOT DETECTED   Candida parapsilosis NOT DETECTED NOT DETECTED   Candida tropicalis NOT DETECTED NOT DETECTED    Juliette Alcide, PharmD, BCPS.   Work Cell: [phone] 11/01/2019 9:01 AM

## 2019-11-01 NOTE — ED Notes (Signed)
Bp showed a slight improvement.

## 2019-11-02 DIAGNOSIS — K812 Acute cholecystitis with chronic cholecystitis: Secondary | ICD-10-CM | POA: Diagnosis not present

## 2019-11-02 DIAGNOSIS — E8809 Other disorders of plasma-protein metabolism, not elsewhere classified: Secondary | ICD-10-CM

## 2019-11-02 DIAGNOSIS — D649 Anemia, unspecified: Secondary | ICD-10-CM

## 2019-11-02 DIAGNOSIS — K82A2 Perforation of gallbladder in cholecystitis: Secondary | ICD-10-CM

## 2019-11-02 DIAGNOSIS — D72829 Elevated white blood cell count, unspecified: Secondary | ICD-10-CM

## 2019-11-02 LAB — MRSA PCR SCREENING: MRSA by PCR: NEGATIVE

## 2019-11-02 LAB — HEMOGLOBIN A1C
Hgb A1c MFr Bld: 10.1 % — ABNORMAL HIGH (ref 4.8–5.6)
Mean Plasma Glucose: 243 mg/dL

## 2019-11-02 LAB — CBC
HCT: 31 % — ABNORMAL LOW (ref 39.0–52.0)
Hemoglobin: 10 g/dL — ABNORMAL LOW (ref 13.0–17.0)
MCH: 28.8 pg (ref 26.0–34.0)
MCHC: 32.3 g/dL (ref 30.0–36.0)
MCV: 89.3 fL (ref 80.0–100.0)
Platelets: 297 10*3/uL (ref 150–400)
RBC: 3.47 MIL/uL — ABNORMAL LOW (ref 4.22–5.81)
RDW: 13.1 % (ref 11.5–15.5)
WBC: 14.3 10*3/uL — ABNORMAL HIGH (ref 4.0–10.5)
nRBC: 0 % (ref 0.0–0.2)

## 2019-11-02 LAB — URINE CULTURE: Culture: 30000 — AB

## 2019-11-02 LAB — BASIC METABOLIC PANEL
Anion gap: 8 (ref 5–15)
BUN: 18 mg/dL (ref 8–23)
CO2: 26 mmol/L (ref 22–32)
Calcium: 8.7 mg/dL — ABNORMAL LOW (ref 8.9–10.3)
Chloride: 101 mmol/L (ref 98–111)
Creatinine, Ser: 1.03 mg/dL (ref 0.61–1.24)
GFR calc Af Amer: >60 mL/min (ref >60)
GFR calc non Af Amer: >60 mL/min (ref >60)
Glucose, Bld: 136 mg/dL — ABNORMAL HIGH (ref 70–99)
Potassium: 3.6 mmol/L (ref 3.5–5.1)
Sodium: 135 mmol/L (ref 135–145)

## 2019-11-02 LAB — GLUCOSE, CAPILLARY
Glucose-Capillary: 129 mg/dL — ABNORMAL HIGH (ref 70–99)
Glucose-Capillary: 223 mg/dL — ABNORMAL HIGH (ref 70–99)
Glucose-Capillary: 242 mg/dL — ABNORMAL HIGH (ref 70–99)
Glucose-Capillary: 136 mg/dL — ABNORMAL HIGH (ref 70–99)

## 2019-11-02 NOTE — Progress Notes (Signed)
Date of Admission:  10/31/2019     Unasyn 4/8>>    Subjective: Pt says he is feeling a little better Some soreness at the site of the drain rt uq No fever   Medications:  . amLODipine  10 mg Oral Daily  . aspirin EC  81 mg Oral Daily  . atorvastatin  40 mg Oral q1800  . Chlorhexidine Gluconate Cloth  6 each Topical Daily  . heparin  5,000 Units Subcutaneous Q8H  . insulin aspart  0-15 Units Subcutaneous TID WC  . insulin detemir  10 Units Subcutaneous QHS  . primidone  50 mg Oral QID    Objective: Vital signs in last 24 hours: Temp:  [97.7 F (36.5 C)-98.2 F (36.8 C)] 98.1 F (36.7 C) (04/09 0852) Pulse Rate:  [64-78] 78 (04/08 1528) Resp:  [12-22] 16 (04/08 1528) BP: (124-158)/(64-99) 135/64 (04/08 1528) SpO2:  [93 %-100 %] 95 % (04/08 1528)  PHYSICAL EXAM:  General: Alert, cooperative, no distress, chronically ill Lungs: b/la ir entry Heart: s1s2 Abdomen: Soft, rt upper quadrant drain- purulent fluid in the bag Extremities: atraumatic, no cyanosis. No edema. No clubbing Skin: No rashes or lesions. Or bruising Lymph: Cervical, supraclavicular normal. Neurologic: Grossly non-focal  Lab Results Recent Labs    10/31/19 1830 11/01/19 0953  WBC 14.5* 16.3*  HGB 10.3* 10.1*  HCT 30.9* 30.4*  NA 126* 137  K 4.7 4.2  CL 91* 101  CO2 24 26  BUN 28* 23  CREATININE 1.46* 1.11   Liver Panel Recent Labs    10/31/19 1830 11/01/19 0953  PROT 7.6 7.2  ALBUMIN 2.7* 2.5*  AST 123* 66*  ALT 146* 116*  ALKPHOS 242* 222*  BILITOT 1.0 0.9   Sedimentation Rate No results for input(s): ESRSEDRATE in the last 72 hours. C-Reactive Protein No results for input(s): CRP in the last 72 hours.  Microbiology:  Studies/Results: CT ABDOMEN PELVIS W CONTRAST  Result Date: 10/31/2019 CLINICAL DATA:  Right lower quadrant pain. Concern for acute appendicitis. EXAM: CT ABDOMEN AND PELVIS WITH CONTRAST TECHNIQUE: Multidetector CT imaging of the abdomen and pelvis was  performed using the standard protocol following bolus administration of intravenous contrast. CONTRAST:  OMNIPAQUE IOHEXOL 300 MG/ML  SOLN COMPARISON:  None. FINDINGS: Lower chest: The lung bases are clear. The heart size is normal. Coronary artery calcifications are noted. Hepatobiliary: The liver is normal. There is massive dilatation of the gallbladder. The gallbladder wall appears thickened and is irregular. There is a probable perforation superiorly as evidence by extension into the adjacent hepatic parenchyma with pockets of gas. This collection measures approximately 4.6 x 2.3 cm (axial series 5, image 23). There appears to be a 1.8 cm stone near the gallbladder neck.There is some mild intrahepatic ductal dilatation. The common bile duct is difficult to fully evaluate but does not appear to be significantly dilated. Pancreas: Normal contours without ductal dilatation. No peripancreatic fluid collection. Spleen: Unremarkable. Adrenals/Urinary Tract: --Adrenal glands: Unremarkable. --Right kidney/ureter: No hydronephrosis or radiopaque kidney stones. --Left kidney/ureter: No hydronephrosis or radiopaque kidney stones. --Urinary bladder: Unremarkable. Stomach/Bowel: --Stomach/Duodenum: No hiatal hernia or other gastric abnormality. Normal duodenal course and caliber. --Small bowel: Unremarkable. --Colon: Rectosigmoid diverticulosis without acute inflammation. --Appendix: Normal. Vascular/Lymphatic: Atherosclerotic calcification is present within the non-aneurysmal abdominal aorta, without hemodynamically significant stenosis. --No retroperitoneal lymphadenopathy. --No mesenteric lymphadenopathy. --No pelvic or inguinal lymphadenopathy. Reproductive: Unremarkable Other: No ascites or free air. Bilateral fat containing inguinal hernias are noted. Musculoskeletal. No acute displaced fractures. IMPRESSION: 1.  Massive dilatation of the gallbladder with findings most consistent with perforated cholecystitis.  There appears to be a developing intrahepatic abscess as detailed above. 2. Rectosigmoid diverticulosis without acute inflammation. 3. Normal appendix in the right lower quadrant. 4. Aortic Atherosclerosis (ICD10-I70.0). Electronically Signed   By: Katherine Mantle M.D.   On: 10/31/2019 22:06   DG Chest Port 1 View  Result Date: 10/31/2019 CLINICAL DATA:  Sepsis EXAM: PORTABLE CHEST 1 VIEW COMPARISON:  09/22/2015 FINDINGS: Low lung volumes. No confluent opacities or effusions. Heart is normal size. No acute bony abnormality. IMPRESSION: Low lung volumes.  No acute cardiopulmonary disease. Electronically Signed   By: Charlett Nose M.D.   On: 10/31/2019 19:11   CT IMAGE GUIDED DRAINAGE BY PERCUTANEOUS CATHETER  Result Date: 11/01/2019 INDICATION: Perforated calculus cholecystitis EXAM: CT GUIDED 10 FRENCH CHOLECYSTOSTOMY Date:  11/01/2019 11/01/2019 3:27 pm Radiologist:  Judie Petit. Ruel Favors, MD Guidance:  CT FLUOROSCOPY TIME:  None. MEDICATIONS: 1% lidocaine local ANESTHESIA/SEDATION: Moderate (conscious) sedation was employed during this procedure. A total of Versed 2.0 mg and Fentanyl 100 mcg was administered intravenously. Moderate Sedation Time: 12 minutes. The patient's level of consciousness and vital signs were monitored continuously by radiology nursing throughout the procedure under my direct supervision. CONTRAST:  None. COMPLICATIONS: None immediate. PROCEDURE: Informed consent was obtained from the patient following explanation of the procedure, risks, benefits and alternatives. The patient understands, agrees and consents for the procedure. All questions were addressed. A time out was performed. Maximal barrier sterile technique utilized including caps, mask, sterile gowns, sterile gloves, large sterile drape, hand hygiene, and ChloraPrep. Previous imaging reviewed. Patient positioned supine. Noncontrast localization CT performed. The perforated distended gallbladder/abscess was localized. Overlying skin  marked for a lower intercostal approach. Under sterile conditions and local anesthesia, an 18 gauge 10 cm access was advanced percutaneously via a transhepatic window into the collection. Needle position confirmed with CT. Syringe aspiration yielded purulent fluid. Sample sent for culture. Guidewire inserted followed by tract dilatation insert a 10 French drain. Drain catheter position confirmed with CT. Syringe aspiration yielded 400 cc bloody exudative fluid. Catheter secured with a prolene suture and connected to external gravity drainage bag. Sterile dressing applied. No immediate complication. Patient tolerated the procedure well. IMPRESSION: Successful CT-guided cholecystostomy as above. Electronically Signed   By: Judie Petit.  Shick M.D.   On: 11/01/2019 15:43   US ABDOMEN LIMITED RUQ  Result Date: 10/31/2019 CLINICAL DATA:  Elevated LFTs. EXAM: ULTRASOUND ABDOMEN LIMITED RIGHT UPPER QUADRANT COMPARISON:  None. FINDINGS: Gallbladder: Abnormal, markedly distended with large amount of heterogeneous internal echogenic debris likely combination of stones and sludge. No evidence of internal vascularity. Diffuse gallbladder wall thickening measuring up to 7 mm. Small amount of pericholecystic fluid. No sonographic Murphy sign noted by sonographer. Common bile duct: Diameter: Not well-defined, tentatively visualized at 2 mm. Liver: Heterogeneously increased echogenicity. No evidence of focal lesion. No definite capsular nodularity. Portal vein is patent on color Doppler imaging with normal direction of blood flow towards the liver. Other: None. IMPRESSION: 1. Abnormal appearance of the gallbladder which is markedly distended/hydropic and filled with heterogeneous echogenic debris. This may represent combination of stones and sludge. Diffuse gallbladder wall thickening. 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 No internal vascularity or hyperemia by ultrasound. Recommend further evaluation with cross-sectional  imaging. MRCP would be the imaging modality of choice if patient is able to tolerate breath hold technique. 2. Heterogeneous increased hepatic echogenicity most consistent with steatosis. 3. No common bile duct dilatation. Electronically Signed  By: Keith Rake M.D.   On: 10/31/2019 20:59     Assessment/Plan: Bacteremia due to enterococcus and streptococcus from the gall bladder Cholecystitis , with dilated GB due to stone at the neck with  perforation and liver abscess-underwent cholecystostomy-purulent drainage on unasyn Bile culture pending- gram stain shows gram positive cocci repeat blood culture sent No hardware Will get 2 d echo but the risk of endocarditis less. Because he has been sick for a few days he could have been bacteremic for a long period  DM-on sliding scale  HTn- on amlodipine  Leucocytosis improving  Anemia - due to infection/malnutrition  Hypoalbuminemia due to poor nutrition  ID will follow him peripherally this weekend- call if needed

## 2019-11-02 NOTE — Progress Notes (Signed)
[facility] SURGICAL PROGRESS NOTE (cpt [id number])  Hospital Day(s): 2.   Interval History: Patient seen and examined, no acute events or new complaints overnight. Patient reports he is feeling better this morning. He has some abdominal soreness at site of cholecystostomy tube. No fever, chills, nausea, or emesis. S/p perc chole placement yesterday, removed 450 ccs blood tingeg purulence, cultures pending. No new labs  Review of Systems:  Constitutional: denies fever, chills  HEENT: denies cough or congestion  Respiratory: denies any shortness of breath  Cardiovascular: denies chest pain or palpitations  Gastrointestinal: + abdominal pain (soreness, at drain site), denied N/V, or diarrhea/and bowel function as per interval history Genitourinary: denies burning with urination or urinary frequency   Vital signs in last 24 hours: [min-max] current  Temp:  [97.7 F (36.5 C)-98.2 F (36.8 C)] 97.9 F (36.6 C) (04/09 0400) Pulse Rate:  [63-78] 78 (04/08 1528) Resp:  [12-22] 16 (04/08 1528) BP: (124-158)/(64-99) 135/64 (04/08 1528) SpO2:  [93 %-100 %] 95 % (04/08 1528)     Height: 5' 11.5" (181.6 cm) Weight: 82.5 kg BMI (Calculated): 25.02   Intake/Output last 2 shifts:  04/08 0701 - 04/09 0700 In: 3128.8 [I.V.:2723.8; IV Piggyback:405] Out: 1050 [Urine:1050]   Physical Exam:  Constitutional: alert, cooperative and no distress  HENT: normocephalic without obvious abnormality  Eyes: PERRL, EOM's grossly intact and symmetric  Respiratory: breathing non-labored at rest  Cardiovascular: regular rate and sinus rhythm  Gastrointestinal: soft, soreness at RUQ drain site, and non-distended, no rebound/guarding, cholecystostomy tube in RUQ with murky bilious output Musculoskeletal: no edema or wounds, motor and sensation grossly intact, NT    Labs:  CBC Latest Ref Rng & Units 11/01/2019 10/31/2019 09/23/2015  WBC 4.0 - 10.5 K/uL 16.3(H) 14.5(H) 7.2  Hemoglobin 13.0 - 17.0 g/dL  10.1(L) 10.3(L) 13.7  Hematocrit 39.0 - 52.0 % 30.4(L) 30.9(L) 39.8(L)  Platelets 150 - 400 K/uL 270 297 177   CMP Latest Ref Rng & Units 11/01/2019 10/31/2019 09/22/2015  Glucose 70 - 99 mg/dL 235(T) 732(KG) 254(Y)  BUN 8 - 23 mg/dL 23 70(W) 17  Creatinine 0.61 - 1.24 mg/dL 2.37 6.28(B) 1.51  Sodium 135 - 145 mmol/L 137 126(L) 137  Potassium 3.5 - 5.1 mmol/L 4.2 4.7 4.4  Chloride 98 - 111 mmol/L 101 91(L) 101  CO2 22 - 32 mmol/L 26 24 28   Calcium 8.9 - 10.3 mg/dL 9.1 9.1  Total Protein 6.5 - 8.1 g/dL 7.2 7.6 -  Total Bilirubin 0.3 - 1.2 mg/dL 0.9 1.0 -  Alkaline Phos 38 - 126 U/L 222(H) 242(H) -  AST 15 - 41 U/L 66(H) 123(H) -  ALT 0 - 44 U/L 116(H) 146(H) -     Imaging studies: No new pertinent imaging studies   Assessment/Plan: (ICD-10's: K81.0) 74 y.o. male with acute cholecystitis s/p percutaneous cholecystostomy tube placement, complicated by pertinent comorbidities including multiple chronic morbid conditions.   - Okay to advance to [facility], [facility]   - Continue percutaneous cholecystostomy tube; monitor output; he understands this will remain in place for 6-8 weeks  - Ordered repeat labs to ensure improvement in leukocytosis    - IVF resuscitation             - Continue IV ABx (switched to UNasyn); follow up Cx; ID following             - pain control prn; antiemetics prn             - monitor abdominal examination             -  further management per primary service; we will follow   All of the above findings and recommendations were discussed with the patient, and the medical team, and all of patient's questions were answered to his expressed satisfaction.  -- Edison Simon, PA-C [facility] 11/02/2019, 7:23 AM [phone] M-F: 7am - 4pm

## 2019-11-02 NOTE — Progress Notes (Signed)
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  Work-up in the ED reveals fever, leukocytosis and CT shows acute on chronic cholecystitis with massively enlarged gallbladder and possible hepatic abscess.  General surgery was consulted and recommended IR drainage of abscess with percutaneous cholecystotomy.   Subjective:  Kyle Ritter thinks he feels better but is not sure.  He has some tenderness where the incision was made.  He states he does not remember my visit yesterday but denies being confused.  He admits to being a little groggy.   Objective: Vitals:   11/02/19 1100 11/02/19 1200 11/02/19 1300 11/02/19 1400  BP: (!) 151/68 134/67  129/67  Pulse: 76 72 72 79  Resp: 18 10 17 15   Temp:      TempSrc:      SpO2: 94% 92% 93% 93%  Weight:      Height:        Intake/Output Summary (Last 24 hours) at 11/02/2019 1457 Last data filed at 11/02/2019 1442 Gross per 24 hour  Intake 2923.81 ml  Output 1900 ml  Net 1023.81 ml   Filed Weights   10/31/19 1825  Weight: 82.5 kg     Exam:  General: Kyle Ritter appears quite tired, lying in bed in no distress.  Is trying to eat but is not very hungry.   Eyes: sclera anicteric, conjuctiva mild injection bilaterally CVS: S1-S2, regular  Respiratory:  decreased air entry bilaterally secondary to decreased inspiratory effort, rales at bases  GI: She does have bowel sounds.  Abdomen is softer, mild tenderness right upper quadrant no rebound.  LE: No edema.  Neuro: A/O x 3, Moving all extremities equally with normal strength, CN 3-12 intact, grossly nonfocal.  Psych: Kyle Ritter is logical and coherent, judgement and insight appear normal, mood and affect appropriate to situation.   Data  Reviewed: Assessment & Plan:   Gallbladder abscess Kyle Ritter underwent cholecystotomy yesterday and they drained 450 cc blood-tinged pus Kyle Ritter tolerated procedure well Cultures are pending Kyle Ritter continues on Unasyn for enterococcal bacteremia.  Sepsis secondary to enterococcal bacteremia Pressure is responded well to IV fluids and broad-spectrum antibiotics. He is now consolidated to Unasyn day #2.  DM2 SSI 0 to 15 units AC at bedtime Metformin being held  AKI Creatinine much improved with treatment of infection and hydration  Hyponatremia Sodium normalized with hydration.    DVT prophylaxis: Lovenox Code Status: Full Family Communication: Wife was at bedside throughout Disposition Plan:   Kyle Ritter is from: Home  Anticipated Discharge Location: Home  Barriers to Discharge: Kyle Ritter still acutely ill  Is Kyle Ritter medically stable for Discharge: No   Consultants:  General surgery  IR  Procedures:  Percutaneous cholecystotomy on 11/01/2019  Antimicrobials: Unasyn day #1   Basic Metabolic Panel: Recent Labs  Lab 10/31/19 1830 11/01/19 0953 11/02/19 0940  NA 126* 137 135  K 4.7 4.2 3.6  CL 91* 101 101  CO2 24 26 26   GLUCOSE 509* 355* 136*  BUN 28* 23 18  CREATININE 1.46* 1.11 1.03  CALCIUM 9.1 9.1 8.7*   Liver Function Tests: Recent Labs  Lab 10/31/19 1830 11/01/19 0953  AST 123* 66*  ALT 146* 116*  ALKPHOS 242* 222*  BILITOT 1.0 0.9  PROT 7.6 7.2  ALBUMIN 2.7* 2.5*  No results for input(s): LIPASE, AMYLASE in the last 168 hours. No results for input(s): AMMONIA in the last 168 hours. CBC: Recent Labs  Lab 10/31/19 1830 11/01/19 0953 11/02/19 0940  WBC 14.5* 16.3* 14.3*  NEUTROABS 12.5*  --   --   HGB 10.3* 10.1* 10.0*  HCT 30.9* 30.4* 31.0*  MCV 86.3 87.9 89.3  PLT 297 270 297   Cardiac Enzymes: No results for input(s): CKTOTAL, CKMB, CKMBINDEX, TROPONINI in the last 168 hours. BNP (last 3 results) No results for input(s): PROBNP  in the last 8760 hours. CBG: Recent Labs  Lab 11/01/19 1247 11/01/19 1612 11/01/19 2124 11/02/19 0724 11/02/19 1141  GLUCAP 273* 216* 220* 129* 223*    Recent Results (from the past 240 hour(s))  Blood Culture (routine x 2)     Status: Abnormal (Preliminary result)   Collection Time: 10/31/19  6:30 PM   Specimen: BLOOD  Result Value Ref Range Status   Specimen Description   Final    BLOOD BLOOD RIGHT HAND Performed at [facility], [address] [place], [place] [address]    Special Requests   Final    BOTTLES DRAWN AEROBIC AND ANAEROBIC Blood Culture adequate volume Performed at [facility], [address] [place], [place] [address]    Culture  Setup Time   Final    GRAM POSITIVE COCCI IN BOTH AEROBIC AND ANAEROBIC BOTTLES CRITICAL RESULT CALLED TO, READ BACK BY AND VERIFIED WITH: SHERRIE THOMPSON @0438  11/01/19 AKT Performed at [facility], [address], [place], [place]    Culture STREPTOCOCCUS GALLOLYTICUS ENTEROCOCCUS FAECIUM  (A)  Final   Report Status PENDING  Incomplete  Blood Culture (routine x 2)     Status: Abnormal (Preliminary result)   Collection Time: 10/31/19  6:30 PM   Specimen: BLOOD  Result Value Ref Range Status   Specimen Description   Final    BLOOD LEFT ANTECUBITAL Performed at [facility], [address] [place], [place]    Special Requests   Final    BOTTLES DRAWN AEROBIC AND ANAEROBIC Blood Culture results may not be optimal due to an inadequate volume of blood received in culture bottles Performed at [facility], [address], [place], [place]    Culture  Setup Time   Final    GRAM POSITIVE COCCI IN BOTH AEROBIC AND ANAEROBIC BOTTLES CRITICAL VALUE NOTED.  VALUE IS CONSISTENT WITH PREVIOUSLY REPORTED AND CALLED VALUE. Performed at [facility], [address] [place], [place]    Culture STREPTOCOCCUS GALLOLYTICUS ENTEROCOCCUS  FAECIUM  (A)  Final   Report Status PENDING  Incomplete  Blood Culture ID Panel (Reflexed)     Status: Abnormal   Collection Time: 10/31/19  6:30 PM  Result Value Ref Range Status   Enterococcus species DETECTED (A) NOT DETECTED Final    Comment: CRITICAL RESULT CALLED TO, READ BACK BY AND VERIFIED WITH: sherrie thompson @0438  11/01/19 akt    Vancomycin resistance NOT DETECTED NOT DETECTED Final   Listeria monocytogenes NOT DETECTED NOT DETECTED Final   Staphylococcus species NOT DETECTED NOT DETECTED Final   Staphylococcus aureus (BCID) NOT DETECTED NOT DETECTED Final   Streptococcus species DETECTED (A) NOT DETECTED Final    Comment: Not Enterococcus species, Streptococcus agalactiae, Streptococcus pyogenes, or Streptococcus pneumoniae. CRITICAL RESULT CALLED TO, READ BACK BY AND VERIFIED WITH: SHERRIE THOMPSON @0438  11/01/19 AKT    Streptococcus agalactiae NOT DETECTED NOT DETECTED Final  Streptococcus pneumoniae NOT DETECTED NOT DETECTED Final   Streptococcus pyogenes NOT DETECTED NOT DETECTED Final   Acinetobacter baumannii NOT DETECTED NOT DETECTED Final   Enterobacteriaceae species NOT DETECTED NOT DETECTED Final   Enterobacter cloacae complex NOT DETECTED NOT DETECTED Final   Escherichia coli NOT DETECTED NOT DETECTED Final   Klebsiella oxytoca NOT DETECTED NOT DETECTED Final   Klebsiella pneumoniae NOT DETECTED NOT DETECTED Final   Proteus species NOT DETECTED NOT DETECTED Final   Serratia marcescens NOT DETECTED NOT DETECTED Final   Haemophilus influenzae NOT DETECTED NOT DETECTED Final   Neisseria meningitidis NOT DETECTED NOT DETECTED Final   Pseudomonas aeruginosa NOT DETECTED NOT DETECTED Final   Candida albicans NOT DETECTED NOT DETECTED Final   Candida glabrata NOT DETECTED NOT DETECTED Final   Candida krusei NOT DETECTED NOT DETECTED Final   Candida parapsilosis NOT DETECTED NOT DETECTED Final   Candida tropicalis NOT DETECTED NOT DETECTED Final    Comment:  Performed at [facility], [address] [place], [place] [address]  Urine culture     Status: None (Preliminary result)   Collection Time: 10/31/19  7:55 PM   Specimen: In/Out Cath Urine  Result Value Ref Range Status   Specimen Description   Final    IN/OUT CATH URINE Performed at [facility], [address], [place], [place] [address]    Special Requests   Final    NONE Performed at [facility], [address], [place], [place] [address]    Culture   Final    CULTURE REINCUBATED FOR BETTER GROWTH Performed at [facility], [address], [place], [place] [address]    Report Status PENDING  Incomplete  Aerobic/Anaerobic Culture (surgical/deep wound)     Status: None (Preliminary result)   Collection Time: 11/01/19  3:23 PM   Specimen: Abdomen; Abscess  Result Value Ref Range Status   Specimen Description   Final    ABDOMEN Performed at [facility], [address] [place], [place] [address]    Special Requests   Final    Normal Performed at [facility], [address] [place], [place] [address]    Gram Stain   Final    ABUNDANT WBC PRESENT, PREDOMINANTLY PMN MODERATE GRAM POSITIVE COCCI IN PAIRS IN CLUSTERS RARE GRAM POSITIVE RODS Performed at [facility], [address], [place], [place] [address]    Culture PENDING  Incomplete   Report Status PENDING  Incomplete      Studies: CT ABDOMEN PELVIS W CONTRAST  Result Date: 10/31/2019 CLINICAL DATA:  Right lower quadrant pain. Concern for acute appendicitis. EXAM: CT ABDOMEN AND PELVIS WITH CONTRAST TECHNIQUE: Multidetector CT imaging of the abdomen and pelvis was performed using the standard protocol following bolus administration of intravenous contrast. CONTRAST:  OMNIPAQUE IOHEXOL 300 MG/ML  SOLN COMPARISON:  None. FINDINGS: Lower chest: The lung bases are clear. The heart size is normal. Coronary artery calcifications are noted. Hepatobiliary:  The liver is normal. There is massive dilatation of the gallbladder. The gallbladder wall appears thickened and is irregular. There is a probable perforation superiorly as evidence by extension into the adjacent hepatic parenchyma with pockets of gas. This collection measures approximately 4.6 x 2.3 cm (axial series 5, image 23). There appears to be a 1.8 cm stone near the gallbladder neck.There is some mild intrahepatic ductal dilatation. The common bile duct is difficult to fully evaluate but does not appear to be significantly dilated.  Pancreas: Normal contours without ductal dilatation. No peripancreatic fluid collection. Spleen: Unremarkable. Adrenals/Urinary Tract: --Adrenal glands: Unremarkable. --Right kidney/ureter: No hydronephrosis or radiopaque kidney stones. --Left kidney/ureter: No hydronephrosis or radiopaque kidney stones. --Urinary bladder: Unremarkable. Stomach/Bowel: --Stomach/Duodenum: No hiatal hernia or other gastric abnormality. Normal duodenal course and caliber. --Small bowel: Unremarkable. --Colon: Rectosigmoid diverticulosis without acute inflammation. --Appendix: Normal. Vascular/Lymphatic: Atherosclerotic calcification is present within the non-aneurysmal abdominal aorta, without hemodynamically significant stenosis. --No retroperitoneal lymphadenopathy. --No mesenteric lymphadenopathy. --No pelvic or inguinal lymphadenopathy. Reproductive: Unremarkable Other: No ascites or free air. Bilateral fat containing inguinal hernias are noted. Musculoskeletal. No acute displaced fractures. IMPRESSION: 1. Massive dilatation of the gallbladder with findings most consistent with perforated cholecystitis. There appears to be a developing intrahepatic abscess as detailed above. 2. Rectosigmoid diverticulosis without acute inflammation. 3. Normal appendix in the right lower quadrant. 4. Aortic Atherosclerosis (ICD10-I70.0). Electronically Signed   By: Katherine Mantle M.D.   On: 10/31/2019 22:06    DG Chest Port 1 View  Result Date: 10/31/2019 CLINICAL DATA:  Sepsis EXAM: PORTABLE CHEST 1 VIEW COMPARISON:  09/22/2015 FINDINGS: Low lung volumes. No confluent opacities or effusions. Heart is normal size. No acute bony abnormality. IMPRESSION: Low lung volumes.  No acute cardiopulmonary disease. Electronically Signed   By: Charlett Nose M.D.   On: 10/31/2019 19:11   CT IMAGE GUIDED DRAINAGE BY PERCUTANEOUS CATHETER  Result Date: 11/01/2019 INDICATION: Perforated calculus cholecystitis EXAM: CT GUIDED 10 FRENCH CHOLECYSTOSTOMY Date:  11/01/2019 11/01/2019 3:27 pm Radiologist:  Judie Petit. Ruel Favors, MD Guidance:  CT FLUOROSCOPY TIME:  None. MEDICATIONS: 1% lidocaine local ANESTHESIA/SEDATION: Moderate (conscious) sedation was employed during this procedure. A total of Versed 2.0 mg and Fentanyl 100 mcg was administered intravenously. Moderate Sedation Time: 12 minutes. The Kyle Ritter's level of consciousness and vital signs were monitored continuously by radiology nursing throughout the procedure under my direct supervision. CONTRAST:  None. COMPLICATIONS: None immediate. PROCEDURE: Informed consent was obtained from the Kyle Ritter following explanation of the procedure, risks, benefits and alternatives. The Kyle Ritter understands, agrees and consents for the procedure. All questions were addressed. A time out was performed. Maximal barrier sterile technique utilized including caps, mask, sterile gowns, sterile gloves, large sterile drape, hand hygiene, and ChloraPrep. Previous imaging reviewed. Kyle Ritter positioned supine. Noncontrast localization CT performed. The perforated distended gallbladder/abscess was localized. Overlying skin marked for a lower intercostal approach. Under sterile conditions and local anesthesia, an 18 gauge 10 cm access was advanced percutaneously via a transhepatic window into the collection. Needle position confirmed with CT. Syringe aspiration yielded purulent fluid. Sample sent for culture.  Guidewire inserted followed by tract dilatation insert a 10 French drain. Drain catheter position confirmed with CT. Syringe aspiration yielded 400 cc bloody exudative fluid. Catheter secured with a prolene suture and connected to external gravity drainage bag. Sterile dressing applied. No immediate complication. Kyle Ritter tolerated the procedure well. IMPRESSION: Successful CT-guided cholecystostomy as above. Electronically Signed   By: Judie Petit.  Shick M.D.   On: 11/01/2019 15:43   US ABDOMEN LIMITED RUQ  Result Date: 10/31/2019 CLINICAL DATA:  Elevated LFTs. EXAM: ULTRASOUND ABDOMEN LIMITED RIGHT UPPER QUADRANT COMPARISON:  None. FINDINGS: Gallbladder: Abnormal, markedly distended with large amount of heterogeneous internal echogenic debris likely combination of stones and sludge. No evidence of internal vascularity. Diffuse gallbladder wall thickening measuring up to 7 mm. Small amount of pericholecystic fluid. No sonographic Murphy sign noted by sonographer. Common bile duct: Diameter: Not well-defined, tentatively visualized at 2 mm. Liver: Heterogeneously increased echogenicity. No evidence of focal  lesion. No definite capsular nodularity. Portal vein is patent on color Doppler imaging with normal direction of blood flow towards the liver. Other: None. IMPRESSION: 1. Abnormal appearance of the gallbladder which is markedly distended/hydropic and filled with heterogeneous echogenic debris. This may represent combination of stones and sludge. Diffuse gallbladder wall thickening. 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 No internal vascularity or hyperemia by ultrasound. Recommend further evaluation with cross-sectional imaging. MRCP would be the imaging modality of choice if Kyle Ritter is able to tolerate breath hold technique. 2. Heterogeneous increased hepatic echogenicity most consistent with steatosis. 3. No common bile duct dilatation. Electronically Signed   By: Keith Rake M.D.   On: 10/31/2019  20:59     Scheduled Meds: . amLODipine  10 mg Oral Daily  . aspirin EC  81 mg Oral Daily  . atorvastatin  40 mg Oral q1800  . Chlorhexidine Gluconate Cloth  6 each Topical Daily  . heparin  5,000 Units Subcutaneous Q8H  . insulin aspart  0-15 Units Subcutaneous TID WC  . insulin detemir  10 Units Subcutaneous QHS  . primidone  50 mg Oral QID   Continuous Infusions: . sodium chloride 100 mL/hr at 11/02/19 0522  . ampicillin-sulbactam (UNASYN) IV 3 g (11/02/19 1229)  . lactated ringers 100 mL/hr at 11/02/19 0300    Active Problems:   Acute on chronic cholecystitis     Vashti Hey, [facility]  If 7PM-7AM, please contact night-coverage [email or web address] Password [email or web address] 11/02/2019, 2:57 PM    LOS: 2 days

## 2019-11-03 ENCOUNTER — Inpatient Hospital Stay (HOSPITAL_COMMUNITY)
Admit: 2019-11-03 | Discharge: 2019-11-03 | Disposition: A | Discharge status: Home / Self Care | Payer: Medicare Other | Attending: Infectious Diseases | Admitting: Infectious Diseases

## 2019-11-03 DIAGNOSIS — K812 Acute cholecystitis with chronic cholecystitis: Secondary | ICD-10-CM | POA: Diagnosis not present

## 2019-11-03 DIAGNOSIS — R7881 Bacteremia: Secondary | ICD-10-CM

## 2019-11-03 DIAGNOSIS — A419 Sepsis, unspecified organism: Secondary | ICD-10-CM | POA: Diagnosis not present

## 2019-11-03 LAB — COMPREHENSIVE METABOLIC PANEL
ALT: 55 U/L — ABNORMAL HIGH (ref 0–44)
AST: 23 U/L (ref 15–41)
Albumin: 2.2 g/dL — ABNORMAL LOW (ref 3.5–5.0)
Alkaline Phosphatase: 158 U/L — ABNORMAL HIGH (ref 38–126)
Anion gap: 7 (ref 5–15)
BUN: 11 mg/dL (ref 8–23)
CO2: 26 mmol/L (ref 22–32)
Calcium: 8.2 mg/dL — ABNORMAL LOW (ref 8.9–10.3)
Chloride: 103 mmol/L (ref 98–111)
Creatinine, Ser: 0.88 mg/dL (ref 0.61–1.24)
GFR calc Af Amer: >60 mL/min (ref >60)
GFR calc non Af Amer: >60 mL/min (ref >60)
Glucose, Bld: 149 mg/dL — ABNORMAL HIGH (ref 70–99)
Potassium: 3.3 mmol/L — ABNORMAL LOW (ref 3.5–5.1)
Sodium: 136 mmol/L (ref 135–145)
Total Bilirubin: 0.8 mg/dL (ref 0.3–1.2)
Total Protein: 6.3 g/dL — ABNORMAL LOW (ref 6.5–8.1)

## 2019-11-03 LAB — GLUCOSE, CAPILLARY
Glucose-Capillary: 136 mg/dL — ABNORMAL HIGH (ref 70–99)
Glucose-Capillary: 225 mg/dL — ABNORMAL HIGH (ref 70–99)
Glucose-Capillary: 170 mg/dL — ABNORMAL HIGH (ref 70–99)
Glucose-Capillary: 220 mg/dL — ABNORMAL HIGH (ref 70–99)

## 2019-11-03 LAB — ECHOCARDIOGRAM COMPLETE
Height: 71.5 in
Weight: 2910.07 oz

## 2019-11-03 LAB — CBC
HCT: 29.1 % — ABNORMAL LOW (ref 39.0–52.0)
Hemoglobin: 9.8 g/dL — ABNORMAL LOW (ref 13.0–17.0)
MCH: 29.2 pg (ref 26.0–34.0)
MCHC: 33.7 g/dL (ref 30.0–36.0)
MCV: 86.6 fL (ref 80.0–100.0)
Platelets: 308 10*3/uL (ref 150–400)
RBC: 3.36 MIL/uL — ABNORMAL LOW (ref 4.22–5.81)
RDW: 13.1 % (ref 11.5–15.5)
WBC: 13.4 10*3/uL — ABNORMAL HIGH (ref 4.0–10.5)
nRBC: 0 % (ref 0.0–0.2)

## 2019-11-03 LAB — LIPASE, BLOOD: Lipase: 17 U/L (ref 11–51)

## 2019-11-03 MED ORDER — SENNOSIDES-DOCUSATE SODIUM 8.6-50 MG PO TABS
1.0000 | ORAL_TABLET | Freq: Two times a day (BID) | ORAL | Status: DC
  Administered 2019-11-03 – 2019-11-10 (×12): 1 via ORAL
  Filled 2019-11-03 (×12): qty 1

## 2019-11-03 MED ORDER — POLYETHYLENE GLYCOL 3350 17 G PO PACK
17.0000 g | PACK | Freq: Every day | ORAL | Status: DC
  Administered 2019-11-03 – 2019-11-10 (×5): 17 g via ORAL
  Filled 2019-11-03 (×5): qty 1

## 2019-11-03 NOTE — Progress Notes (Signed)
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  Work-up in the ED reveals fever, leukocytosis and CT shows acute on chronic cholecystitis with massively enlarged gallbladder and possible hepatic abscess.  General surgery was consulted and recommended IR drainage of abscess with percutaneous cholecystotomy.  4/10: Patient seen and examined.  Wife at bedside.  Patient feels well.  Sensitivities pending from blood cultures.  Remains on antibiotics.  White count decreasing.  No fevers over interval.   Assessment & Plan:   Active Problems:   Acute on chronic cholecystitis  Gallbladder abscess Patient underwent cholecystotomy yesterday and they drained 450 cc blood-tinged pus Patient tolerated procedure well Cultures are pending Patient continues on Unasyn for enterococcal bacteremia.  Sepsis secondary to enterococcal bacteremia Pressure is responded well to IV fluids and broad-spectrum antibiotics. He is now consolidated to Unasyn day #3. Continue to follow cultures Tailor abx as appropriate  DM2 SSI 0 to 15 units AC at bedtime Metformin being held  AKI Creatinine much improved with treatment of infection and hydration  Hyponatremia Sodium normalized with hydration.  DVT prophylaxis: LMWH Code Status: FULL Family Communication: wife at bedside Disposition Plan: Anticipate return to previous home environment.  Patient may need some home health skilled nursing for cholecystostomy tube care if he is to be discharged with tube in place.  Currently awaiting final culture and sensitivities for appropriate tailoring of antibiotic therapy.  Anticipate discharge within 48 to 72 hours.  Consultants:   ID  Procedures:   Percutaneous  cholecystostomy  Antimicrobials:   Unasyn day #3   Subjective: Seen and examined Wife at bedside Patient endorses mild pain at tube insertion site otherwise no complaints  Objective: Vitals:   11/02/19 2352 11/03/19 0453 11/03/19 0957 11/03/19 1151  BP: 139/69 (!) 150/74 (!) 142/71 (!) 146/72  Pulse: 74 66 74 71  Resp: 18 18  16   Temp: 99.3 F (37.4 C) 98 F (36.7 C)  98.4 F (36.9 C)  TempSrc: Oral Oral  Oral  SpO2: 94% 97% 96% 94%  Weight:      Height:        Intake/Output Summary (Last 24 hours) at 11/03/2019 1439 Last data filed at 11/03/2019 1421 Gross per 24 hour  Intake 3148.82 ml  Output 2830 ml  Net 318.82 ml   Filed Weights   10/31/19 1825  Weight: 82.5 kg    Examination:  General exam: Appears calm and comfortable  Respiratory system: Clear to auscultation. Respiratory effort normal. Cardiovascular system: S1 & S2 heard, RRR. No JVD, murmurs, rubs, gallops or clicks. No pedal edema. Gastrointestinal system: Abdomen is nondistended, soft and nontender. No organomegaly or masses felt. Normal bowel sounds heard.  Right percutaneous cholecystostomy tube in place.  Tube insertion site C/D/I Central nervous system: Alert and oriented. No focal neurological deficits. Extremities: Symmetric 5 x 5 power. Skin: No rashes, lesions or ulcers Psychiatry: Judgement and insight appear normal. Mood & affect appropriate.     Data Reviewed: I have personally reviewed following labs and imaging studies  CBC: Recent Labs  Lab 10/31/19 1830 11/01/19 0953 11/02/19 0940 11/03/19 0717  WBC 14.5* 16.3* 14.3* 13.4*  NEUTROABS 12.5*  --   --   --   HGB 10.3* 10.1* 10.0* 9.8*  HCT 30.9* 30.4* 31.0* 29.1*  MCV 86.3 87.9 89.3 86.6  PLT 297 270 297 308   Basic Metabolic Panel: Recent Labs  Lab 10/31/19 1830 11/01/19 0953 11/02/19 0940 11/03/19 0717  NA 126* 137 135 136  K 4.7 4.2 3.6 3.3*  CL 91* 101 101 103  CO2 24 26 26 26   GLUCOSE 509* 355* 136* 149*  BUN  28* 23 18 11   CREATININE 1.46* 1.11 1.03 0.88  CALCIUM 9.1 9.1 8.7* 8.2*   GFR: Estimated Creatinine Clearance: 78.5 mL/min (by C-G formula based on SCr of 0.88 mg/dL). Liver Function Tests: Recent Labs  Lab 10/31/19 1830 11/01/19 0953 11/03/19 0717  AST 123* 66* 23  ALT 146* 116* 55*  ALKPHOS 242* 222* 158*  BILITOT 1.0 0.9 0.8  PROT 7.6 7.2 6.3*  ALBUMIN 2.7* 2.5* 2.2*   Recent Labs  Lab 11/03/19 0717  LIPASE 17   No results for input(s): AMMONIA in the last 168 hours. Coagulation Profile: Recent Labs  Lab 11/01/19 0953  INR 1.4*   Cardiac Enzymes: No results for input(s): CKTOTAL, CKMB, CKMBINDEX, TROPONINI in the last 168 hours. BNP (last 3 results) No results for input(s): PROBNP in the last 8760 hours. HbA1C: Recent Labs    11/01/19 0953  HGBA1C 10.1*   CBG: Recent Labs  Lab 11/02/19 1141 11/02/19 1737 11/02/19 2146 11/03/19 0740 11/03/19 1148  GLUCAP 223* 242* 136* 136* 225*   Lipid Profile: No results for input(s): CHOL, HDL, LDLCALC, TRIG, CHOLHDL, LDLDIRECT in the last 72 hours. Thyroid Function Tests: No results for input(s): TSH, T4TOTAL, FREET4, T3FREE, THYROIDAB in the last 72 hours. Anemia Panel: No results for input(s): VITAMINB12, FOLATE, FERRITIN, TIBC, IRON, RETICCTPCT in the last 72 hours. Sepsis Labs: Recent Labs  Lab 10/31/19 1830  PROCALCITON 1.82  LATICACIDVEN 1.6    Recent Results (from the past 240 hour(s))  Blood Culture (routine x 2)     Status: Abnormal (Preliminary result)   Collection Time: 10/31/19  6:30 PM   Specimen: BLOOD  Result Value Ref Range Status   Specimen Description   Final    BLOOD BLOOD RIGHT HAND Performed at [facility] [address], [place], [address] [place]    Special Requests   Final    BOTTLES DRAWN AEROBIC AND ANAEROBIC Blood Culture adequate volume Performed at [facility], [address], [place], [address] [place]    Culture  Setup Time   Final    GRAM  POSITIVE COCCI IN BOTH AEROBIC AND ANAEROBIC BOTTLES CRITICAL RESULT CALLED TO, READ BACK BY AND VERIFIED WITH: SHERRIE THOMPSON @0438  11/01/19 AKT Performed at [facility], [address], [place], [facility] [address]    Culture STREPTOCOCCUS GALLOLYTICUS ENTEROCOCCUS FAECIUM  (A)  Final   Report Status PENDING  Incomplete  Blood Culture (routine x 2)     Status: Abnormal (Preliminary result)   Collection Time: 10/31/19  6:30 PM   Specimen: BLOOD  Result Value Ref Range Status   Specimen Description   Final    BLOOD LEFT ANTECUBITAL Performed at [facility], [address] [place], [facility] [address]    Special Requests   Final    BOTTLES DRAWN AEROBIC AND ANAEROBIC Blood Culture results may not be optimal due to an inadequate volume of blood received in culture bottles Performed at [facility], [address], [place], [facility] [address]    Culture  Setup Time   Final    GRAM POSITIVE COCCI IN BOTH AEROBIC AND ANAEROBIC BOTTLES CRITICAL  VALUE NOTED.  VALUE IS CONSISTENT WITH PREVIOUSLY REPORTED AND CALLED VALUE. Performed at [facility], [address] [place], [place] [address]    Culture STREPTOCOCCUS GALLOLYTICUS ENTEROCOCCUS FAECIUM  (A)  Final   Report Status PENDING  Incomplete  Blood Culture ID Panel (Reflexed)     Status: Abnormal   Collection Time: 10/31/19  6:30 PM  Result Value Ref Range Status   Enterococcus species DETECTED (A) NOT DETECTED Final    Comment: CRITICAL RESULT CALLED TO, READ BACK BY AND VERIFIED WITH: sherrie thompson @0438  11/01/19 akt    Vancomycin resistance NOT DETECTED NOT DETECTED Final   Listeria monocytogenes NOT DETECTED NOT DETECTED Final   Staphylococcus species NOT DETECTED NOT DETECTED Final   Staphylococcus aureus (BCID) NOT DETECTED NOT DETECTED Final   Streptococcus species DETECTED (A) NOT DETECTED Final    Comment: Not Enterococcus species, Streptococcus agalactiae, Streptococcus pyogenes, or  Streptococcus pneumoniae. CRITICAL RESULT CALLED TO, READ BACK BY AND VERIFIED WITH: SHERRIE THOMPSON @0438  11/01/19 AKT    Streptococcus agalactiae NOT DETECTED NOT DETECTED Final   Streptococcus pneumoniae NOT DETECTED NOT DETECTED Final   Streptococcus pyogenes NOT DETECTED NOT DETECTED Final   Acinetobacter baumannii NOT DETECTED NOT DETECTED Final   Enterobacteriaceae species NOT DETECTED NOT DETECTED Final   Enterobacter cloacae complex NOT DETECTED NOT DETECTED Final   Escherichia coli NOT DETECTED NOT DETECTED Final   Klebsiella oxytoca NOT DETECTED NOT DETECTED Final   Klebsiella pneumoniae NOT DETECTED NOT DETECTED Final   Proteus species NOT DETECTED NOT DETECTED Final   Serratia marcescens NOT DETECTED NOT DETECTED Final   Haemophilus influenzae NOT DETECTED NOT DETECTED Final   Neisseria meningitidis NOT DETECTED NOT DETECTED Final   Pseudomonas aeruginosa NOT DETECTED NOT DETECTED Final   Candida albicans NOT DETECTED NOT DETECTED Final   Candida glabrata NOT DETECTED NOT DETECTED Final   Candida krusei NOT DETECTED NOT DETECTED Final   Candida parapsilosis NOT DETECTED NOT DETECTED Final   Candida tropicalis NOT DETECTED NOT DETECTED Final    Comment: Performed at [facility], [address] [place], [place] [address]  Urine culture     Status: Abnormal   Collection Time: 10/31/19  7:55 PM   Specimen: In/Out Cath Urine  Result Value Ref Range Status   Specimen Description   Final    IN/OUT CATH URINE Performed at [facility], [address] [place], [place] [address]    Special Requests   Final    NONE Performed at [facility], [address] [place], [place] [address]    Culture (A)  Final    30,000 COLONIES/mL MULTIPLE SPECIES PRESENT, SUGGEST RECOLLECTION   Report Status 11/02/2019 FINAL  Final  Aerobic/Anaerobic Culture (surgical/deep wound)     Status: None (Preliminary result)   Collection Time: 11/01/19  3:23 PM    Specimen: Abdomen; Abscess  Result Value Ref Range Status   Specimen Description   Final    ABDOMEN Performed at [facility], [address] [place], [place] [address]    Special Requests   Final    Normal Performed at [facility], [address] [place], [place] [address]    Gram Stain   Final    ABUNDANT WBC PRESENT, PREDOMINANTLY PMN MODERATE GRAM POSITIVE COCCI IN PAIRS IN CLUSTERS RARE GRAM POSITIVE RODS    Culture   Final    CULTURE REINCUBATED FOR BETTER GROWTH Performed at [facility], [place],  [place] [address]    Report Status PENDING  Incomplete  Culture, blood (Routine X 2) w Reflex to ID Panel     Status: None (Preliminary result)   Collection Time: 11/02/19  1:11 AM   Specimen: BLOOD  Result Value Ref Range Status   Specimen Description BLOOD BLOOD RIGHT HAND  Final   Special Requests   Final    BOTTLES DRAWN AEROBIC AND ANAEROBIC Blood Culture adequate volume   Culture   Final    NO GROWTH 1 DAY Performed at [facility], [address], [place], [place] [address]    Report Status PENDING  Incomplete  Culture, blood (Routine X 2) w Reflex to ID Panel     Status: None (Preliminary result)   Collection Time: 11/02/19  1:17 AM   Specimen: BLOOD  Result Value Ref Range Status   Specimen Description BLOOD BLOOD LEFT FOREARM  Final   Special Requests   Final    BOTTLES DRAWN AEROBIC AND ANAEROBIC Blood Culture adequate volume   Culture   Final    NO GROWTH 1 DAY Performed at [facility], [address] [place], [place] [address]    Report Status PENDING  Incomplete  MRSA PCR Screening     Status: None   Collection Time: 11/02/19 12:53 PM   Specimen: Nasopharyngeal  Result Value Ref Range Status   MRSA by PCR NEGATIVE NEGATIVE Final    Comment:        The GeneXpert MRSA Assay (FDA approved for NASAL specimens only), is one component of a comprehensive MRSA colonization surveillance  program. It is not intended to diagnose MRSA infection nor to guide or monitor treatment for MRSA infections. Performed at [facility], [address] [place], [place] [address]          Radiology Studies: CT IMAGE GUIDED DRAINAGE BY PERCUTANEOUS CATHETER  Result Date: 11/01/2019 INDICATION: Perforated calculus cholecystitis EXAM: CT GUIDED 10 FRENCH CHOLECYSTOSTOMY Date:  11/01/2019 11/01/2019 3:27 pm Radiologist:  Judie Petit. Ruel Favors, MD Guidance:  CT FLUOROSCOPY TIME:  None. MEDICATIONS: 1% lidocaine local ANESTHESIA/SEDATION: Moderate (conscious) sedation was employed during this procedure. A total of Versed 2.0 mg and Fentanyl 100 mcg was administered intravenously. Moderate Sedation Time: 12 minutes. The patient's level of consciousness and vital signs were monitored continuously by radiology nursing throughout the procedure under my direct supervision. CONTRAST:  None. COMPLICATIONS: None immediate. PROCEDURE: Informed consent was obtained from the patient following explanation of the procedure, risks, benefits and alternatives. The patient understands, agrees and consents for the procedure. All questions were addressed. A time out was performed. Maximal barrier sterile technique utilized including caps, mask, sterile gowns, sterile gloves, large sterile drape, hand hygiene, and ChloraPrep. Previous imaging reviewed. Patient positioned supine. Noncontrast localization CT performed. The perforated distended gallbladder/abscess was localized. Overlying skin marked for a lower intercostal approach. Under sterile conditions and local anesthesia, an 18 gauge 10 cm access was advanced percutaneously via a transhepatic window into the collection. Needle position confirmed with CT. Syringe aspiration yielded purulent fluid. Sample sent for culture. Guidewire inserted followed by tract dilatation insert a 10 French drain. Drain catheter position confirmed with CT. Syringe aspiration yielded 400 cc  bloody exudative fluid. Catheter secured with a prolene suture and connected to external gravity drainage bag. Sterile dressing applied. No immediate complication. Patient tolerated the procedure well. IMPRESSION: Successful CT-guided cholecystostomy as above. Electronically Signed   By: Judie Petit.  Shick M.D.   On: 11/01/2019 15:43  Scheduled Meds: . amLODipine  10 mg Oral Daily  . aspirin EC  81 mg Oral Daily  . atorvastatin  40 mg Oral q1800  . Chlorhexidine Gluconate Cloth  6 each Topical Daily  . heparin  5,000 Units Subcutaneous Q8H  . insulin aspart  0-15 Units Subcutaneous TID WC  . insulin detemir  10 Units Subcutaneous QHS  . polyethylene glycol  17 g Oral Daily  . primidone  50 mg Oral QID  . senna-docusate  1 tablet Oral BID   Continuous Infusions: . sodium chloride 100 mL/hr at 11/03/19 1421  . ampicillin-sulbactam (UNASYN) IV 3 g (11/03/19 1424)  . lactated ringers Stopped (11/02/19 0521)     LOS: 3 days    Time spent: 35 minutes    Tresa MooreSudheer B Antonio Woodhams, MD [facility] Pager [phone]  If 7PM-7AM, please contact night-coverage 11/03/2019, 2:39 PM

## 2019-11-03 NOTE — Progress Notes (Signed)
CC: Cholecystitis and hepatic abscess Subjective: He had CT-guided drainage.  Feels better.  Some mild abdominal discomfort.  Objective: Vital signs in last 24 hours: Temp:  [98 F (36.7 C)-99.3 F (37.4 C)] 98.4 F (36.9 C) (04/10 1151) Pulse Rate:  [66-93] 71 (04/10 1151) Resp:  [16-18] 16 (04/10 1151) BP: (139-155)/(69-86) 146/72 (04/10 1151) SpO2:  [94 %-99 %] 94 % (04/10 1151) Last BM Date: 10/30/19  Intake/Output from previous day: 04/09 0701 - 04/10 0700 In: 2668.8 [I.V.:2358.8; IV Piggyback:300] Out: 2430 [Urine:2200; Drains:230] Intake/Output this shift: Total I/O In: 1657.3 [P.O.:480; I.V.:977.3; IV Piggyback:200] Out: 700 [Urine:700]  Physical exam:  Debilitated in NAd UYQ:IHKV, mild TTTP around drain site. Cholecystostomy tube in place and patent.No peritonitis  Lab Results: CBC  Recent Labs    11/02/19 0940 11/03/19 0717  WBC 14.3* 13.4*  HGB 10.0* 9.8*  HCT 31.0* 29.1*  PLT 297 308   BMET Recent Labs    11/02/19 0940 11/03/19 0717  NA 135 136  K 3.6 3.3*  CL 101 103  CO2 26 26  GLUCOSE 136* 149*  BUN 18 11  CREATININE 1.03 0.88  CALCIUM 8.7* 8.2*   PT/INR Recent Labs    11/01/19 0953  LABPROT 16.9*  INR 1.4*   ABG No results for input(s): PHART, HCO3 in the last 72 hours.  Invalid input(s): PCO2, PO2  Studies/Results: ECHOCARDIOGRAM COMPLETE  Result Date: 11/03/2019    ECHOCARDIOGRAM REPORT   Patient Name:   GRANVILLE WHITEFIELD Date of Exam: 11/03/2019 Medical Rec #:  [id number]     Height:       71.5 in Accession #:    [id number]    Weight:       181.9 lb Date of Birth:  February 23, 1946     BSA:          2.036 m Patient Age:    74 years      BP:           146/72 mmHg Patient Gender: M             HR:           74 bpm. Exam Location:  [facility] Procedure: 2D Echo Indications:     BACTEREMIA 790.7/R78.81  History:         Patient has prior history of Echocardiogram examinations, most                  recent 09/22/2015. Risk Factors:Hypertension and  Diabetes.  Sonographer:     Johnathan Hausen Referring Phys:  [id number] Lynn Ito Diagnosing Phys: Debbe Odea MD IMPRESSIONS  1. Left ventricular ejection fraction, by estimation, is 55 to 60%. The left ventricle has normal function. The left ventricle has no regional wall motion abnormalities. Left ventricular diastolic parameters are consistent with Grade II diastolic dysfunction (pseudonormalization).  2. Right ventricular systolic function is normal. The right ventricular size is mildly enlarged.  3. Left atrial size was mildly dilated.  4. The mitral valve is grossly normal. No evidence of mitral valve regurgitation.  5. The aortic valve is grossly normal. Aortic valve regurgitation is not visualized.  6. The inferior vena cava is dilated in size with >50% respiratory variability, suggesting right atrial pressure of 8 mmHg. Conclusion(s)/Recommendation(s): No evidence of valvular vegetations on this transthoracic echocardiogram. Would recommend a transesophageal echocardiogram to exclude infective endocarditis if clinically indicated. FINDINGS  Left Ventricle: Left ventricular ejection fraction, by estimation, is 55 to 60%. The left ventricle has normal function. The  left ventricle has no regional wall motion abnormalities. The left ventricular internal cavity size was normal in size. There is  no left ventricular hypertrophy. Left ventricular diastolic parameters are consistent with Grade II diastolic dysfunction (pseudonormalization). Right Ventricle: The right ventricular size is mildly enlarged. Right vetricular wall thickness was not assessed. Right ventricular systolic function is normal. Left Atrium: Left atrial size was mildly dilated. Right Atrium: Right atrial size was normal in size. Pericardium: There is no evidence of pericardial effusion. Mitral Valve: The mitral valve is grossly normal. No evidence of mitral valve regurgitation. Tricuspid Valve: The tricuspid valve is grossly  normal. Tricuspid valve regurgitation is not demonstrated. Aortic Valve: The aortic valve is grossly normal. Aortic valve regurgitation is not visualized. Aortic valve mean gradient measures 10.0 mmHg. Aortic valve peak gradient measures 18.3 mmHg. Aortic valve area, by VTI measures 2.01 cm. Pulmonic Valve: The pulmonic valve was not well visualized. Pulmonic valve regurgitation is not visualized. Aorta: The aortic root is normal in size and structure. Venous: The inferior vena cava is dilated in size with greater than 50% respiratory variability, suggesting right atrial pressure of 8 mmHg. IAS/Shunts: No atrial level shunt detected by color flow Doppler.  LEFT VENTRICLE PLAX 2D LVIDd:         4.03 cm  Diastology LVIDs:         2.89 cm  LV e' lateral:   12.80 cm/s LV PW:         1.04 cm  LV E/e' lateral: 9.4 LV IVS:        0.84 cm  LV e' medial:    6.42 cm/s LVOT diam:     2.00 cm  LV E/e' medial:  18.7 LV SV:         87 LV SV Index:   43 LVOT Area:     3.14 cm  RIGHT VENTRICLE RV Mid diam:    5.17 cm LEFT ATRIUM           Index LA diam:      5.60 cm 2.75 cm/m LA Vol (A4C): 80.2 ml 39.40 ml/m  AORTIC VALVE AV Area (Vmax):    1.86 cm AV Area (Vmean):   1.78 cm AV Area (VTI):     2.01 cm AV Vmax:           214.00 cm/s AV Vmean:          150.000 cm/s AV VTI:            0.431 m AV Peak Grad:      18.3 mmHg AV Mean Grad:      10.0 mmHg LVOT Vmax:         127.00 cm/s LVOT Vmean:        85.200 cm/s LVOT VTI:          0.276 m LVOT/AV VTI ratio: 0.64  AORTA Ao Root diam: 4.70 cm MITRAL VALVE MV Area (PHT): 3.48 cm     SHUNTS MV Decel Time: 218 msec     Systemic VTI:  0.28 m MV E velocity: 120.00 cm/s  Systemic Diam: 2.00 cm MV A velocity: 143.00 cm/s MV E/A ratio:  0.84 Kate Sable MD Electronically signed by Kate Sable MD Signature Date/Time: 11/03/2019/3:47:27 PM    Final     Anti-infectives: Anti-infectives (From admission, onward)   Start     Dose/Rate Route Frequency Ordered Stop   11/01/19  1200  Ampicillin-Sulbactam (UNASYN) 3 g in sodium chloride 0.9 % 100 mL IVPB  3 g 200 mL/hr over 30 Minutes Intravenous Every 6 hours 11/01/19 0920     11/01/19 0800  ceFEPIme (MAXIPIME) 2 g in sodium chloride 0.9 % 100 mL IVPB  Status:  Discontinued     2 g 200 mL/hr over 30 Minutes Intravenous Every 12 hours 10/31/19 2304 11/01/19 0920   11/01/19 0400  metroNIDAZOLE (FLAGYL) IVPB 500 mg  Status:  Discontinued     500 mg 100 mL/hr over 60 Minutes Intravenous Every 8 hours 10/31/19 2332 11/01/19 0920   10/31/19 2330  vancomycin (VANCOCIN) IVPB 1000 mg/200 mL premix     1,000 mg 200 mL/hr over 60 Minutes Intravenous  Once 10/31/19 2329 11/01/19 0148   10/31/19 2315  vancomycin (VANCOREADY) IVPB 1250 mg/250 mL  Status:  Discontinued     1,250 mg 166.7 mL/hr over 90 Minutes Intravenous Every 24 hours 10/31/19 2304 10/31/19 2329   10/31/19 1945  ceFEPIme (MAXIPIME) 2 g in sodium chloride 0.9 % 100 mL IVPB     2 g 200 mL/hr over 30 Minutes Intravenous  Once 10/31/19 1932 10/31/19 2118   10/31/19 1945  metroNIDAZOLE (FLAGYL) IVPB 500 mg     500 mg 100 mL/hr over 60 Minutes Intravenous  Once 10/31/19 1932 10/31/19 2118   10/31/19 1945  vancomycin (VANCOCIN) IVPB 1000 mg/200 mL premix     1,000 mg 200 mL/hr over 60 Minutes Intravenous  Once 10/31/19 1932 10/31/19 2118      Assessment/Plan:  Evidence of cholecystitis and liver abscess secondary to perforation.  Status post drain placement.  He continues to improve.  May liberalize the diet.  Continue antibiotics for at least 2 weeks and we can see him in the outpatient setting.  Likely will require repeat imaging before removing catheter given the complexity of the collection. No need for surgical intervention at this time. Please note that I spent at least 25 minutes in this encounter with greater than 50% spent in coordination and counseling of his care  Sterling Big, MD, [facility]  11/03/2019

## 2019-11-04 DIAGNOSIS — K812 Acute cholecystitis with chronic cholecystitis: Secondary | ICD-10-CM | POA: Diagnosis not present

## 2019-11-04 DIAGNOSIS — R7881 Bacteremia: Secondary | ICD-10-CM | POA: Diagnosis not present

## 2019-11-04 DIAGNOSIS — A419 Sepsis, unspecified organism: Secondary | ICD-10-CM | POA: Diagnosis not present

## 2019-11-04 LAB — CULTURE, BLOOD (ROUTINE X 2): Special Requests: ADEQUATE

## 2019-11-04 LAB — GLUCOSE, CAPILLARY
Glucose-Capillary: 126 mg/dL — ABNORMAL HIGH (ref 70–99)
Glucose-Capillary: 141 mg/dL — ABNORMAL HIGH (ref 70–99)
Glucose-Capillary: 134 mg/dL — ABNORMAL HIGH (ref 70–99)
Glucose-Capillary: 182 mg/dL — ABNORMAL HIGH (ref 70–99)

## 2019-11-04 NOTE — Progress Notes (Addendum)
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  Work-up in the ED reveals fever, leukocytosis and CT shows acute on chronic cholecystitis with massively enlarged gallbladder and possible hepatic abscess.  General surgery was consulted and recommended IR drainage of abscess with percutaneous cholecystotomy.  4/10: Patient seen and examined.  Wife at bedside.  Patient feels well.  Sensitivities pending from blood cultures.  Remains on antibiotics.  White count decreasing.  No fevers over interval.  4/11: Patient seen and examined.  No family members at bedside this morning.  Patient feels well.  No complaints plaints.  Culture and sensitivity resulted.  Blood culture positive for Streptococcus gallolyticus and Enterococcus faecium.  Remains on Unasyn.  No fevers noted over interval.   Assessment & Plan:   Active Problems:   Acute on chronic cholecystitis  Gallbladder abscess Patient underwent cholecystotomy yesterday and they drained 450 cc blood-tinged pus Patient tolerated procedure well Cultures are pending Patient continues on Unasyn for enterococcal/streptococcal bacteremia.  Sepsis secondary to enterococcal and streptococcal bacteremia Pressure is responded well to IV fluids and broad-spectrum antibiotics. Blood culture on admission positive for Enterococcus and Streptococcus Patient remains on Unasyn, currently day 4 Will await infectious disease follow-up for further tailoring of antibiotics Patient may require PICC line placement for administration of extended course IV antibiotics, defer to ID  DM2 SSI 0 to 15 units AC and at bedtime Metformin being held  AKI Creatinine much improved with treatment of infection and  hydration  Hyponatremia Sodium normalized with hydration.  DVT prophylaxis: LMWH Code Status: FULL Family Communication: attempted to call sister Wyline Mood [phone] on 4/11.  No answer, could not leave VM Disposition Plan: Anticipate return to previous home environment.  Physical therapy evaluation placed.  Patient remains on IV Unasyn.  Pending reevaluation by infectious disease for further recommendations regarding ongoing antibiotic course.  Consultants:   ID  Procedures:   Percutaneous cholecystostomy  Antimicrobials:   Unasyn day #4   Subjective: Seen and examined No family at bedside this morning Patient feels well, no complaints  Objective: Vitals:   11/03/19 1151 11/03/19 2005 11/04/19 0421 11/04/19 0932  BP: (!) 146/72 132/63 136/70 (!) 143/75  Pulse: 71 75 70 66  Resp: 16 20 18 18   Temp: 98.4 F (36.9 C) 98.6 F (37 C) 98.4 F (36.9 C) 97.9 F (36.6 C)  TempSrc: Oral Oral Oral Oral  SpO2: 94% 98% 99% 96%  Weight:      Height:        Intake/Output Summary (Last 24 hours) at 11/04/2019 1201 Last data filed at 11/04/2019 0801 Gross per 24 hour  Intake 2131.76 ml  Output 410 ml  Net 1721.76 ml   Filed Weights   10/31/19 1825  Weight: 82.5 kg    Examination:  General exam: Appears calm and comfortable  Respiratory system: Clear to auscultation. Respiratory effort normal. Cardiovascular system: S1 & S2 heard, RRR. No JVD, murmurs, rubs, gallops or clicks. No pedal edema. Gastrointestinal system: Abdomen is nondistended, soft and nontender. No organomegaly or masses felt. Normal bowel sounds heard.  Right percutaneous cholecystostomy tube in place.  Tube insertion site C/D/I Central nervous system: Alert and oriented. No focal neurological deficits. Extremities: Symmetric 5 x 5 power. Skin: No rashes, lesions or ulcers Psychiatry: Judgement  and insight appear normal. Mood & affect appropriate.     Data Reviewed: I have personally reviewed  following labs and imaging studies  CBC: Recent Labs  Lab 10/31/19 1830 11/01/19 0953 11/02/19 0940 11/03/19 0717  WBC 14.5* 16.3* 14.3* 13.4*  NEUTROABS 12.5*  --   --   --   HGB 10.3* 10.1* 10.0* 9.8*  HCT 30.9* 30.4* 31.0* 29.1*  MCV 86.3 87.9 89.3 86.6  PLT 297 270 297 308   Basic Metabolic Panel: Recent Labs  Lab 10/31/19 1830 11/01/19 0953 11/02/19 0940 11/03/19 0717  NA 126* 137 135 136  K 4.7 4.2 3.6 3.3*  CL 91* 101 101 103  CO2 24 26 26 26   GLUCOSE 509* 355* 136* 149*  BUN 28* 23 18 11   CREATININE 1.46* 1.11 1.03 0.88  CALCIUM 9.1 9.1 8.7* 8.2*   GFR: Estimated Creatinine Clearance: 78.5 mL/min (by C-G formula based on SCr of 0.88 mg/dL). Liver Function Tests: Recent Labs  Lab 10/31/19 1830 11/01/19 0953 11/03/19 0717  AST 123* 66* 23  ALT 146* 116* 55*  ALKPHOS 242* 222* 158*  BILITOT 1.0 0.9 0.8  PROT 7.6 7.2 6.3*  ALBUMIN 2.7* 2.5* 2.2*   Recent Labs  Lab 11/03/19 0717  LIPASE 17   No results for input(s): AMMONIA in the last 168 hours. Coagulation Profile: Recent Labs  Lab 11/01/19 0953  INR 1.4*   Cardiac Enzymes: No results for input(s): CKTOTAL, CKMB, CKMBINDEX, TROPONINI in the last 168 hours. BNP (last 3 results) No results for input(s): PROBNP in the last 8760 hours. HbA1C: No results for input(s): HGBA1C in the last 72 hours. CBG: Recent Labs  Lab 11/03/19 0740 11/03/19 1148 11/03/19 1637 11/03/19 2145 11/04/19 0917  GLUCAP 136* 225* 170* 220* 126*   Lipid Profile: No results for input(s): CHOL, HDL, LDLCALC, TRIG, CHOLHDL, LDLDIRECT in the last 72 hours. Thyroid Function Tests: No results for input(s): TSH, T4TOTAL, FREET4, T3FREE, THYROIDAB in the last 72 hours. Anemia Panel: No results for input(s): VITAMINB12, FOLATE, FERRITIN, TIBC, IRON, RETICCTPCT in the last 72 hours. Sepsis Labs: Recent Labs  Lab 10/31/19 1830  PROCALCITON 1.82  LATICACIDVEN 1.6    Recent Results (from the past 240 hour(s))  Blood  Culture (routine x 2)     Status: Abnormal   Collection Time: 10/31/19  6:30 PM   Specimen: BLOOD  Result Value Ref Range Status   Specimen Description   Final    BLOOD BLOOD RIGHT HAND Performed at [facility], [address] [place], [place] [address]    Special Requests   Final    BOTTLES DRAWN AEROBIC AND ANAEROBIC Blood Culture adequate volume Performed at [facility], [address] [place], [place] [address]    Culture  Setup Time   Final    GRAM POSITIVE COCCI IN BOTH AEROBIC AND ANAEROBIC BOTTLES CRITICAL RESULT CALLED TO, READ BACK BY AND VERIFIED WITH: SHERRIE THOMPSON @0438  11/01/19 AKT    Culture (A)  Final    STREPTOCOCCUS GALLOLYTICUS ENTEROCOCCUS FAECIUM SUSCEPTIBILITIES PERFORMED ON PREVIOUS CULTURE WITHIN THE LAST 5 DAYS. Performed at [facility], [address] [place], [place] [address]    Report Status 11/04/2019 FINAL  Final  Blood Culture (routine x 2)     Status: Abnormal   Collection Time: 10/31/19  6:30 PM   Specimen: BLOOD  Result Value Ref Range Status   Specimen Description   Final    BLOOD LEFT ANTECUBITAL Performed at [facility],  [place], [place] [address]    Special Requests   Final    BOTTLES DRAWN AEROBIC AND ANAEROBIC Blood Culture results may not be optimal due to an inadequate volume of blood received in culture bottles Performed at [facility], [address], [place], [place] [address]    Culture  Setup Time   Final    GRAM POSITIVE COCCI IN BOTH AEROBIC AND ANAEROBIC BOTTLES CRITICAL VALUE NOTED.  VALUE IS CONSISTENT WITH PREVIOUSLY REPORTED AND CALLED VALUE. Performed at [facility], [address] [place], [place] [address]    Culture STREPTOCOCCUS GALLOLYTICUS ENTEROCOCCUS FAECIUM  (A)  Final   Report Status 11/04/2019 FINAL  Final   Organism ID, Bacteria STREPTOCOCCUS GALLOLYTICUS  Final   Organism ID, Bacteria ENTEROCOCCUS FAECIUM  Final       Susceptibility   Enterococcus faecium - MIC*    AMPICILLIN <=2 SENSITIVE Sensitive     VANCOMYCIN <=0.5 SENSITIVE Sensitive     GENTAMICIN SYNERGY SENSITIVE Sensitive     * ENTEROCOCCUS FAECIUM   Streptococcus gallolyticus - MIC*    PENICILLIN 0.12 SENSITIVE Sensitive     CEFTRIAXONE 0.25 SENSITIVE Sensitive     ERYTHROMYCIN >=8 RESISTANT Resistant     LEVOFLOXACIN 2 SENSITIVE Sensitive     VANCOMYCIN 0.25 SENSITIVE Sensitive     * STREPTOCOCCUS GALLOLYTICUS  Blood Culture ID Panel (Reflexed)     Status: Abnormal   Collection Time: 10/31/19  6:30 PM  Result Value Ref Range Status   Enterococcus species DETECTED (A) NOT DETECTED Final    Comment: CRITICAL RESULT CALLED TO, READ BACK BY AND VERIFIED WITH: sherrie thompson @0438  11/01/19 akt    Vancomycin resistance NOT DETECTED NOT DETECTED Final   Listeria monocytogenes NOT DETECTED NOT DETECTED Final   Staphylococcus species NOT DETECTED NOT DETECTED Final   Staphylococcus aureus (BCID) NOT DETECTED NOT DETECTED Final   Streptococcus species DETECTED (A) NOT DETECTED Final    Comment: Not Enterococcus species, Streptococcus agalactiae, Streptococcus pyogenes, or Streptococcus pneumoniae. CRITICAL RESULT CALLED TO, READ BACK BY AND VERIFIED WITH: SHERRIE THOMPSON @0438  11/01/19 AKT    Streptococcus agalactiae NOT DETECTED NOT DETECTED Final   Streptococcus pneumoniae NOT DETECTED NOT DETECTED Final   Streptococcus pyogenes NOT DETECTED NOT DETECTED Final   Acinetobacter baumannii NOT DETECTED NOT DETECTED Final   Enterobacteriaceae species NOT DETECTED NOT DETECTED Final   Enterobacter cloacae complex NOT DETECTED NOT DETECTED Final   Escherichia coli NOT DETECTED NOT DETECTED Final   Klebsiella oxytoca NOT DETECTED NOT DETECTED Final   Klebsiella pneumoniae NOT DETECTED NOT DETECTED Final   Proteus species NOT DETECTED NOT DETECTED Final   Serratia marcescens NOT DETECTED NOT DETECTED Final   Haemophilus influenzae NOT DETECTED  NOT DETECTED Final   Neisseria meningitidis NOT DETECTED NOT DETECTED Final   Pseudomonas aeruginosa NOT DETECTED NOT DETECTED Final   Candida albicans NOT DETECTED NOT DETECTED Final   Candida glabrata NOT DETECTED NOT DETECTED Final   Candida krusei NOT DETECTED NOT DETECTED Final   Candida parapsilosis NOT DETECTED NOT DETECTED Final   Candida tropicalis NOT DETECTED NOT DETECTED Final    Comment: Performed at [facility], [address], [place], [place] [address]  Urine culture     Status: Abnormal   Collection Time: 10/31/19  7:55 PM   Specimen: In/Out Cath Urine  Result Value Ref Range Status   Specimen Description   Final    IN/OUT CATH URINE Performed at [facility],  [address], [place], [place] [address]    Special Requests   Final    NONE Performed at [facility], [address] [place], [place] [address]    Culture (A)  Final    30,000 COLONIES/mL MULTIPLE SPECIES PRESENT, SUGGEST RECOLLECTION   Report Status 11/02/2019 FINAL  Final  Aerobic/Anaerobic Culture (surgical/deep wound)     Status: None (Preliminary result)   Collection Time: 11/01/19  3:23 PM   Specimen: Abdomen; Abscess  Result Value Ref Range Status   Specimen Description   Final    ABDOMEN Performed at [facility], [address], [place], [place] [address]    Special Requests   Final    Normal Performed at [facility], [address] [place], [place] [address]    Gram Stain   Final    ABUNDANT WBC PRESENT, PREDOMINANTLY PMN MODERATE GRAM POSITIVE COCCI IN PAIRS IN CLUSTERS RARE GRAM POSITIVE RODS    Culture   Final    CULTURE REINCUBATED FOR BETTER GROWTH Performed at [facility], [address], [place], [place] [address]    Report Status PENDING  Incomplete  Culture, blood (Routine X 2) w Reflex to ID Panel     Status: None (Preliminary result)   Collection Time: 11/02/19  1:11 AM   Specimen: BLOOD  Result Value Ref Range  Status   Specimen Description BLOOD BLOOD RIGHT HAND  Final   Special Requests   Final    BOTTLES DRAWN AEROBIC AND ANAEROBIC Blood Culture adequate volume   Culture   Final    NO GROWTH 2 DAYS Performed at [facility], [address] [place], [place] [address]    Report Status PENDING  Incomplete  Culture, blood (Routine X 2) w Reflex to ID Panel     Status: None (Preliminary result)   Collection Time: 11/02/19  1:17 AM   Specimen: BLOOD  Result Value Ref Range Status   Specimen Description BLOOD BLOOD LEFT FOREARM  Final   Special Requests   Final    BOTTLES DRAWN AEROBIC AND ANAEROBIC Blood Culture adequate volume   Culture   Final    NO GROWTH 2 DAYS Performed at [facility], [address], [place], [place] [address]    Report Status PENDING  Incomplete  MRSA PCR Screening     Status: None   Collection Time: 11/02/19 12:53 PM   Specimen: Nasopharyngeal  Result Value Ref Range Status   MRSA by PCR NEGATIVE NEGATIVE Final    Comment:        The GeneXpert MRSA Assay (FDA approved for NASAL specimens only), is one component of a comprehensive MRSA colonization surveillance program. It is not intended to diagnose MRSA infection nor to guide or monitor treatment for MRSA infections. Performed at [facility], [address], [place], [place] [address]          Radiology Studies: ECHOCARDIOGRAM COMPLETE  Result Date: 11/03/2019    ECHOCARDIOGRAM REPORT   Patient Name:   JARMON JAVID Date of Exam: 11/03/2019 Medical Rec #:  [id number]     Height:       71.5 in Accession #:    [id number]    Weight:       181.9 lb Date of Birth:  February 05, 1946     BSA:          2.036 m Patient Age:    73 years      BP:  146/72 mmHg Patient Gender: M             HR:           74 bpm. Exam Location:  [facility] Procedure: 2D Echo Indications:     BACTEREMIA 790.7/R78.81  History:         Patient has prior history of Echocardiogram examinations, most                   recent 09/22/2015. Risk Factors:Hypertension and Diabetes.  Sonographer:     Johnathan Hausen Referring Phys:  [id number] Lynn Ito Diagnosing Phys: Debbe Odea MD IMPRESSIONS  1. Left ventricular ejection fraction, by estimation, is 55 to 60%. The left ventricle has normal function. The left ventricle has no regional wall motion abnormalities. Left ventricular diastolic parameters are consistent with Grade II diastolic dysfunction (pseudonormalization).  2. Right ventricular systolic function is normal. The right ventricular size is mildly enlarged.  3. Left atrial size was mildly dilated.  4. The mitral valve is grossly normal. No evidence of mitral valve regurgitation.  5. The aortic valve is grossly normal. Aortic valve regurgitation is not visualized.  6. The inferior vena cava is dilated in size with >50% respiratory variability, suggesting right atrial pressure of 8 mmHg. Conclusion(s)/Recommendation(s): No evidence of valvular vegetations on this transthoracic echocardiogram. Would recommend a transesophageal echocardiogram to exclude infective endocarditis if clinically indicated. FINDINGS  Left Ventricle: Left ventricular ejection fraction, by estimation, is 55 to 60%. The left ventricle has normal function. The left ventricle has no regional wall motion abnormalities. The left ventricular internal cavity size was normal in size. There is  no left ventricular hypertrophy. Left ventricular diastolic parameters are consistent with Grade II diastolic dysfunction (pseudonormalization). Right Ventricle: The right ventricular size is mildly enlarged. Right vetricular wall thickness was not assessed. Right ventricular systolic function is normal. Left Atrium: Left atrial size was mildly dilated. Right Atrium: Right atrial size was normal in size. Pericardium: There is no evidence of pericardial effusion. Mitral Valve: The mitral valve is grossly normal. No evidence of mitral valve regurgitation.  Tricuspid Valve: The tricuspid valve is grossly normal. Tricuspid valve regurgitation is not demonstrated. Aortic Valve: The aortic valve is grossly normal. Aortic valve regurgitation is not visualized. Aortic valve mean gradient measures 10.0 mmHg. Aortic valve peak gradient measures 18.3 mmHg. Aortic valve area, by VTI measures 2.01 cm. Pulmonic Valve: The pulmonic valve was not well visualized. Pulmonic valve regurgitation is not visualized. Aorta: The aortic root is normal in size and structure. Venous: The inferior vena cava is dilated in size with greater than 50% respiratory variability, suggesting right atrial pressure of 8 mmHg. IAS/Shunts: No atrial level shunt detected by color flow Doppler.  LEFT VENTRICLE PLAX 2D LVIDd:         4.03 cm  Diastology LVIDs:         2.89 cm  LV e' lateral:   12.80 cm/s LV PW:         1.04 cm  LV E/e' lateral: 9.4 LV IVS:        0.84 cm  LV e' medial:    6.42 cm/s LVOT diam:     2.00 cm  LV E/e' medial:  18.7 LV SV:         87 LV SV Index:   43 LVOT Area:     3.14 cm  RIGHT VENTRICLE RV Mid diam:    5.17 cm LEFT ATRIUM  Index LA diam:      5.60 cm 2.75 cm/m LA Vol (A4C): 80.2 ml 39.40 ml/m  AORTIC VALVE AV Area (Vmax):    1.86 cm AV Area (Vmean):   1.78 cm AV Area (VTI):     2.01 cm AV Vmax:           214.00 cm/s AV Vmean:          150.000 cm/s AV VTI:            0.431 m AV Peak Grad:      18.3 mmHg AV Mean Grad:      10.0 mmHg LVOT Vmax:         127.00 cm/s LVOT Vmean:        85.200 cm/s LVOT VTI:          0.276 m LVOT/AV VTI ratio: 0.64  AORTA Ao Root diam: 4.70 cm MITRAL VALVE MV Area (PHT): 3.48 cm     SHUNTS MV Decel Time: 218 msec     Systemic VTI:  0.28 m MV E velocity: 120.00 cm/s  Systemic Diam: 2.00 cm MV A velocity: 143.00 cm/s MV E/A ratio:  0.84 Debbe Odea MD Electronically signed by Debbe Odea MD Signature Date/Time: 11/03/2019/3:47:27 PM    Final         Scheduled Meds: . amLODipine  10 mg Oral Daily  . aspirin EC  81 mg  Oral Daily  . atorvastatin  40 mg Oral q1800  . Chlorhexidine Gluconate Cloth  6 each Topical Daily  . heparin  5,000 Units Subcutaneous Q8H  . insulin aspart  0-15 Units Subcutaneous TID WC  . insulin detemir  10 Units Subcutaneous QHS  . polyethylene glycol  17 g Oral Daily  . primidone  50 mg Oral QID  . senna-docusate  1 tablet Oral BID   Continuous Infusions: . sodium chloride Stopped (11/03/19 1935)  . ampicillin-sulbactam (UNASYN) IV 3 g (11/04/19 0502)  . lactated ringers Stopped (11/02/19 0521)     LOS: 4 days    Time spent: 35 minutes    Tresa Moore, MD [facility] Pager [phone]  If 7PM-7AM, please contact night-coverage 11/04/2019, 12:01 PM

## 2019-11-04 NOTE — Progress Notes (Signed)
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  Work-up in the ED reveals fever, leukocytosis and CT shows acute on chronic cholecystitis with massively enlarged gallbladder and possible hepatic abscess.  General surgery was consulted and recommended IR drainage of abscess with percutaneous cholecystotomy. Patient underwent cholecystotomy yesterday and they drained 450 cc blood-tinged pus  Clinical Impression  Patient is sitting in recliner and agrees to PT evaluation. He has 3/5 BLE hip and knee strength. He transfers sit to stand with min assist with RW, and supine <> sit with min assist. He ambulates with RW 20 feet and min assist . He has fair standing balance with RW. He will benefit from skilled PT to improve mobility and strength.     Follow Up Recommendations Home health PT    Equipment Recommendations  Rolling walker with 5" wheels    Recommendations for Other Services       Precautions / Restrictions Precautions Precautions: Fall Restrictions Weight Bearing Restrictions: No      Mobility  Bed Mobility Overal bed mobility: Needs Assistance Bed Mobility: Supine to Sit;Sit to Supine     Supine to sit: Min assist Sit to supine: Min assist   General bed mobility comments: cues for sequencing  Transfers Overall transfer level: Needs assistance Equipment used: Rolling walker (2 wheeled) Transfers: Sit to/from Stand Sit to Stand: Min assist         General transfer comment: cues for sit tos tand   Ambulation/Gait                Stairs            Wheelchair Mobility    Modified Rankin (Stroke Patients Only)       Balance Overall balance assessment: Needs assistance Sitting-balance  support: No upper extremity supported;Feet supported Sitting balance-Leahy Scale: Good     Standing balance support: Bilateral upper extremity supported Standing balance-Leahy Scale: Fair                               Pertinent Vitals/Pain      Home Living Family/patient expects to be discharged to:: Private residence Living Arrangements: Alone Available Help at Discharge: Family Type of Home: Mobile home Home Access: Ramped entrance     Artesian: One level        Prior Function Level of Independence: Independent with assistive device(s)               Hand Dominance        Extremity/Trunk Assessment   Upper Extremity Assessment Upper Extremity Assessment: Overall WFL for tasks assessed    Lower Extremity Assessment Lower Extremity Assessment: Generalized weakness;RLE deficits/detail;LLE deficits/detail RLE Deficits / Details: 3/5 hip and knee LLE Deficits / Details: 3/5 hip and knee       Communication   Communication: No difficulties  Cognition Arousal/Alertness: Awake/alert Behavior During Therapy: WFL for tasks assessed/performed Overall Cognitive Status: Within Functional Limits for tasks assessed                                        General Comments  Exercises     Assessment/Plan    PT Assessment Patient needs continued PT services  PT Problem List Decreased strength;Decreased balance;Decreased mobility       PT Treatment Interventions Gait training;Functional mobility training;Therapeutic activities;Therapeutic exercise;Balance training    PT Goals (Current goals can be found in the Care Plan section)  Acute Rehab PT Goals Patient Stated Goal: to walk better PT Goal Formulation: Patient unable to participate in goal setting Time For Goal Achievement: 11/18/19 Potential to Achieve Goals: Fair    Frequency Min 2X/week   Barriers to discharge        Co-evaluation               AM-PAC  PT "6 Clicks" Mobility  Outcome Measure Help needed turning from your back to your side while in a flat bed without using bedrails?: A Little Help needed moving from lying on your back to sitting on the side of a flat bed without using bedrails?: A Little Help needed moving to and from a bed to a chair (including a wheelchair)?: A Little Help needed standing up from a chair using your arms (e.g., wheelchair or bedside chair)?: A Little Help needed to walk in hospital room?: A Little Help needed climbing 3-5 steps with a railing? : Total 6 Click Score: 16    End of Session Equipment Utilized During Treatment: Gait belt Activity Tolerance: Patient limited by fatigue Patient left: in bed Nurse Communication: Mobility status PT Visit Diagnosis: Unsteadiness on feet (R26.81);Muscle weakness (generalized) (M62.81);History of falling (Z91.81);Difficulty in walking, not elsewhere classified (R26.2)    Time: 8676-7209 PT Time Calculation (min) (ACUTE ONLY): 16 min   Charges:   PT Evaluation $PT Eval Low Complexity: 1 Low PT Treatments $Gait Training: 8-22 mins          Ezekiel Ina, PT DPT 11/04/2019, 3:06 PM

## 2019-11-05 ENCOUNTER — Inpatient Hospital Stay: Payer: Medicare Other

## 2019-11-05 ENCOUNTER — Encounter: Payer: Self-pay | Admitting: Internal Medicine

## 2019-11-05 DIAGNOSIS — K8001 Calculus of gallbladder with acute cholecystitis with obstruction: Secondary | ICD-10-CM

## 2019-11-05 DIAGNOSIS — K812 Acute cholecystitis with chronic cholecystitis: Secondary | ICD-10-CM | POA: Diagnosis not present

## 2019-11-05 DIAGNOSIS — A419 Sepsis, unspecified organism: Secondary | ICD-10-CM | POA: Diagnosis not present

## 2019-11-05 DIAGNOSIS — B954 Other streptococcus as the cause of diseases classified elsewhere: Secondary | ICD-10-CM

## 2019-11-05 DIAGNOSIS — B952 Enterococcus as the cause of diseases classified elsewhere: Secondary | ICD-10-CM | POA: Diagnosis not present

## 2019-11-05 DIAGNOSIS — R7881 Bacteremia: Secondary | ICD-10-CM | POA: Diagnosis not present

## 2019-11-05 LAB — GLUCOSE, CAPILLARY
Glucose-Capillary: 107 mg/dL — ABNORMAL HIGH (ref 70–99)
Glucose-Capillary: 196 mg/dL — ABNORMAL HIGH (ref 70–99)
Glucose-Capillary: 165 mg/dL — ABNORMAL HIGH (ref 70–99)
Glucose-Capillary: 184 mg/dL — ABNORMAL HIGH (ref 70–99)

## 2019-11-05 MED ORDER — HEPARIN SODIUM (PORCINE) 5000 UNIT/ML IJ SOLN
5000.0000 [IU] | Freq: Once | INTRAMUSCULAR | Status: AC
  Administered 2019-11-05: 5000 [IU] via SUBCUTANEOUS
  Filled 2019-11-05: qty 1

## 2019-11-05 MED ORDER — IOHEXOL 300 MG/ML  SOLN
100.0000 mL | Freq: Once | INTRAMUSCULAR | Status: AC | PRN
  Administered 2019-11-05: 100 mL via INTRAVENOUS

## 2019-11-05 MED ORDER — SODIUM CHLORIDE 0.9 % IV SOLN
INTRAVENOUS | Status: DC | PRN
  Administered 2019-11-05 – 2019-11-11 (×6): 250 mL via INTRAVENOUS

## 2019-11-05 MED ORDER — ENSURE MAX PROTEIN PO LIQD
11.0000 [oz_av] | Freq: Two times a day (BID) | ORAL | Status: DC
  Administered 2019-11-05 – 2019-11-07 (×4): 11 [oz_av] via ORAL
  Filled 2019-11-05: qty 330

## 2019-11-05 MED ORDER — ADULT MULTIVITAMIN W/MINERALS CH
1.0000 | ORAL_TABLET | Freq: Every day | ORAL | Status: DC
  Administered 2019-11-07 – 2019-11-10 (×4): 1 via ORAL
  Filled 2019-11-05 (×4): qty 1

## 2019-11-05 MED ORDER — HEPARIN SODIUM (PORCINE) 5000 UNIT/ML IJ SOLN: 5000.0000 [IU] | Freq: Three times a day (TID) | INTRAMUSCULAR | Status: DC

## 2019-11-05 NOTE — Progress Notes (Signed)
Initial Nutrition Assessment  DOCUMENTATION CODES:   Not applicable  INTERVENTION:   Ensure Max protein supplement BID, each supplement provides 150kcal and 30g of protein.  MVI daily   NUTRITION DIAGNOSIS:   Inadequate oral intake related to acute illness as evidenced by meal completion < 25%.  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  Work-up in the ED reveals fever, leukocytosis and CT shows acute on chronic cholecystitis with massively enlarged gallbladder and possible hepatic abscess. Pt s/p IR drainage of abscess with percutaneous cholecystotomy 4/8  Met with pt in room today. Pt reports poor appetite and oral intake for several days pta. Pt reports that his appetite remains poor in hospital; pt did not touch his lunch today. Pt reports that he just has no desire to eat. RD discussed with pt the importance of adequate nutrition needed to preserve lean muscle. Pt is willing to drink strawberry or vanilla supplements in hospital. RD will order Ensure Max as this is lower in fat but may switch to Ensure Enlive if patient continues to refuse meals. Per chart, pt down 4lbs(2%) since admit; this is significant.   Medications reviewed and include: aspirin, heparin, insulin, miralax, senokot, unasyn, LRS @100ml /hr   Labs reviewed: K 3.3(L), alk phos 158(H) Wbc- 13.4(H), Hgb 9.8(L), Hct 29.1(L) cbgs- 107, 196 x 24 hrs AIC 10.1(H)- 4/8  NUTRITION - FOCUSED PHYSICAL EXAM:    Most Recent Value  Orbital Region  No depletion  Upper Arm Region  No depletion  Thoracic and Lumbar Region  No depletion  Buccal Region  No depletion  Temple Region  No depletion   Clavicle Bone Region  No depletion  Clavicle and Acromion Bone Region  No depletion  Scapular Bone Region  No depletion  Dorsal Hand  No depletion  Patellar Region  No depletion  Anterior Thigh Region  No depletion  Posterior Calf Region  No depletion  Edema (RD Assessment)  None  Hair  Reviewed  Eyes  Reviewed  Mouth  Reviewed  Skin  Reviewed  Nails  Reviewed     Diet Order:   Diet Order            Diet NPO time specified  Diet effective midnight        DIET SOFT Room service appropriate? Yes; Fluid consistency: Thin  Diet effective 0500             EDUCATION NEEDS:   Education needs have been addressed  Skin:  Skin Assessment: Reviewed RN Assessment(Incision flank s/p biliary drain)  Last BM:  4/6- constipation  Height:   Ht Readings from Last 1 Encounters:  10/31/19 5' 11.5" (1.816 m)    Weight:   Wt Readings from Last 1 Encounters:  11/05/19 80.8 kg    Ideal Body Weight:  78 kg  BMI:  Body mass index is 24.51 kg/m.  Estimated Nutritional Needs:   Kcal:  1900-2200kcal/day  Protein:  95-110g/day  Fluid:  >1.9L/day  Koleen Distance MS, RD, LDN Please refer to [facility] for RD and/or RD on-call/weekend/after hours pager

## 2019-11-05 NOTE — Care Management Important Message (Signed)
Important Message  Patient Details  Name: Kyle Ritter MRN: [id number] Date of Birth: [birth year]   Medicare Important Message Given:  Yes     [name] 11/05/2019, 11:02 AM

## 2019-11-05 NOTE — TOC Initial Note (Signed)
Transition of Care [facility]) - Initial/Assessment Note    Patient Details  Name: Kyle Ritter MRN: [id number] Date of Birth: November 18, 1945  Transition of Care [facility]) CM/SW Contact:    Candie Chroman, LCSW Phone Number: 11/05/2019, 12:25 PM  Clinical Narrative: CSW met with patient. No supports at bedside. CSW introduced role and explained that therapy recommendations would be discussed. Patient agreeable to home health. Provided CMS scores for agencies that serve his zip code. He would like for his sister to review the list when she arrives. Notified patient of DME recommendation for a rolling walker but he said he already has one at home. Patient typically independent, likes to play golf, and is hoping to return to that soon. No further concerns. CSW encouraged patient to contact CSW as needed. CSW will continue to follow patient for support and facilitate return home when stable.                Expected Discharge Plan: [place] Barriers to Discharge: Continued Medical Work up   Patient Goals and CMS Choice   CMS Medicare.gov Compare Post Acute Care list provided to:: Patient    Expected Discharge Plan and Services Expected Discharge Plan: [place] Choice: [place] arrangements for the past 2 months: Mobile Home                                      Prior Living Arrangements/Services Living arrangements for the past 2 months: Mobile Home Lives with:: Self Patient language and need for interpreter reviewed:: Yes Do you feel safe going back to the place where you live?: Yes      Need for Family Participation in Patient Care: Yes (Comment) Care giver support system in place?: Yes (comment)   Criminal Activity/Legal Involvement Pertinent to Current Situation/Hospitalization: No - Comment as needed  Activities of Daily Living Home Assistive Devices/Equipment: None ADL Screening (condition at time of  admission) Patient's cognitive ability adequate to safely complete daily activities?: Yes Is the patient deaf or have difficulty hearing?: No Does the patient have difficulty seeing, even when wearing glasses/contacts?: No Does the patient have difficulty concentrating, remembering, or making decisions?: No Patient able to express need for assistance with ADLs?: Yes Does the patient have difficulty dressing or bathing?: No Independently performs ADLs?: Yes (appropriate for developmental age) Communication: Independent Is this a change from baseline?: Pre-admission baseline Dressing (OT): Needs assistance Is this a change from baseline?: Pre-admission baseline Grooming: Needs assistance Is this a change from baseline?: Pre-admission baseline Feeding: Independent Bathing: Needs assistance Is this a change from baseline?: Pre-admission baseline Toileting: Independent In/Out Bed: Needs assistance Is this a change from baseline?: Pre-admission baseline Walks in Home: Independent Does the patient have difficulty walking or climbing stairs?: No Weakness of Legs: Both Weakness of Arms/Hands: None  Permission Sought/Granted Permission sought to share information with : Facility Art therapist granted to share information with : Yes, Verbal Permission Granted     Permission granted to share info w AGENCY: Home Health Agencies        Emotional Assessment Appearance:: Appears stated age Attitude/Demeanor/Rapport: Engaged, Gracious Affect (typically observed): Accepting, Appropriate, Calm, Pleasant Orientation: : Oriented to Self, Oriented to Place, Oriented to  Time, Oriented to Situation Alcohol / Substance Use: Not Applicable Psych Involvement: No (comment)  Admission diagnosis:  Acute cholecystitis [K81.0] Chronic cholecystitis [K81.1] Cholelithiases [K80.20] Elevated LFTs [R79.89] Acute on chronic cholecystitis [K81.2] Sepsis without acute organ dysfunction,  due to unspecified organism [facility]) [A41.9] Patient Active Problem List   Diagnosis Date Noted  . Acute on chronic cholecystitis 10/31/2019  . Elevated troponin 09/22/2015   PCP:  Danelle Berry, NP Pharmacy:   [facility] [address] ([address]), [place] [address] [place]) [place] [address] Phone: [phone] Fax: [phone]     Social Determinants of Health (SDOH) Interventions    Readmission Risk Interventions No flowsheet data found.

## 2019-11-05 NOTE — Progress Notes (Signed)
ID Pt is back from CT scan Tired No specific complaints Patient Vitals for the past 24 hrs:  BP Temp Temp src Pulse Resp SpO2 Weight  11/05/19 2021 (!) 146/72 98.1 F (36.7 C) Oral 65 18 99 % --  11/05/19 1638 -- -- -- -- -- -- 80.8 kg  11/05/19 1157 (!) 152/75 98.1 F (36.7 C) Oral 77 18 95 % --  11/05/19 0520 (!) 157/75 98.3 F (36.8 C) Oral 69 -- 98 % --   No distress Rt upper quadrant drain with purulent fluid Chest b/l air entry HS S1s Cns non focal  Labs CBC Latest Ref Rng & Units 11/03/2019 11/02/2019 11/01/2019  WBC 4.0 - 10.5 K/uL 13.4(H) 14.3(H) 16.3(H)  Hemoglobin 13.0 - 17.0 g/dL 2.8(J) 10.0(L) 10.1(L)  Hematocrit 39.0 - 52.0 % 29.1(L) 31.0(L) 30.4(L)  Platelets 150 - 400 K/uL 308 297 270    CMP Latest Ref Rng & Units 11/03/2019 11/02/2019 11/01/2019  Glucose 70 - 99 mg/dL 681(L) 572(I) 203(T)  BUN 8 - 23 mg/dL 11 18 23   Creatinine 0.61 - 1.24 mg/dL 5.97 4.16  Sodium 135 - 145 mmol/L 136 135 137  Potassium 3.5 - 5.1 mmol/L 3.3(L) 3.6 4.2  Chloride 98 - 111 mmol/L 103 101 101  CO2 22 - 32 mmol/L 26 26 26   Calcium 8.9 - 10.3 mg/dL 8.2(L) 8.7(L) 9.1  Total Protein 6.5 - 8.1 g/dL 6.3(L) - 7.2  Total Bilirubin 0.3 - 1.2 mg/dL 0.8 - 0.9  Alkaline Phos 38 - 126 U/L 158(H) - 222(H)  AST 15 - 41 U/L 23 - 66(H)  ALT 0 - 44 U/L 55(H) - 116(H)     Impression/recommendation Enterococcus and streptococcus gallolyticus bacteremia Source likely gall bladder  Gall bladder perforation due to  Neck obstruction with stone leading to distension Liver abscess Cholecystotomy repea Ct done today shows persistent liver abscess which has slightly enlarged Pt in unasyn IR to evaluate the patient  Discussed the management with care team

## 2019-11-05 NOTE — Progress Notes (Signed)
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  Work-up in the ED reveals fever, leukocytosis and CT shows acute on chronic cholecystitis with massively enlarged gallbladder and possible hepatic abscess.  General surgery was consulted and recommended IR drainage of abscess with percutaneous cholecystotomy.  4/10: Patient seen and examined.  Wife at bedside.  Patient feels well.  Sensitivities pending from blood cultures.  Remains on antibiotics.  White count decreasing.  No fevers over interval.  4/11: Patient seen and examined.  No family members at bedside this morning.  Patient feels well.  No complaints plaints.  Culture and sensitivity resulted.  Blood culture positive for Streptococcus gallolyticus and Enterococcus faecium.  Remains on Unasyn.  No fevers noted over interval.  4/12: Patient seen and examined.  No family members at bedside this morning.  Patient feels well.  No complaints.  Continues on Unasyn.  No fevers noted over interval.  Pending infectious disease follow-up.   Assessment & Plan:   Active Problems:   Acute on chronic cholecystitis  Gallbladder abscess Patient underwent cholecystotomy yesterday and they drained 450 cc blood-tinged pus Patient tolerated procedure well Cultures are pending Patient continues on Unasyn for enterococcal/streptococcal bacteremia. Pending ID re-evaluation Repeat CT abd today to assess abscess cavity size  Sepsis secondary to enterococcal and streptococcal bacteremia Pressure is responded well to IV fluids and broad-spectrum antibiotics. Blood culture on admission positive for Enterococcus and Streptococcus Patient remains on Unasyn, currently day 4 Will await infectious disease follow-up for further  tailoring of antibiotics Patient may require PICC line placement for administration of extended course IV antibiotics, defer to ID  DM2 SSI 0 to 15 units AC and at bedtime Metformin being held  AKI Creatinine much improved with treatment of infection and hydration  Hyponatremia Sodium normalized with hydration.  DVT prophylaxis: LMWH Code Status: FULL Family Communication: attempted to call sister Wyline MoodDotty [phone] on 4/11.  No answer, could not leave VM Disposition Plan: Anticipate return to previous home environment.  Physical therapy evaluation placed.  Patient remains on IV Unasyn.  Pending reevaluation by infectious disease for further recommendations regarding ongoing antibiotic course.  Consultants:   ID  Procedures:   Percutaneous cholecystostomy  Antimicrobials:   Unasyn day #5   Subjective: Seen and examined No family at bedside this morning Patient feels well, no complaints  Objective: Vitals:   11/04/19 1213 11/04/19 2011 11/05/19 0520 11/05/19 1157  BP: (!) 146/75 (!) 144/73 (!) 157/75 (!) 152/75  Pulse: 69 77 69 77  Resp: 18 18  18   Temp: 98.1 F (36.7 C) 99.3 F (37.4 C) 98.3 F (36.8 C) 98.1 F (36.7 C)  TempSrc: Oral Oral Oral Oral  SpO2: 97% 95% 98% 95%  Weight:      Height:        Intake/Output Summary (Last 24 hours) at 11/05/2019 1334 Last data filed at 11/05/2019 0942 Gross per 24 hour  Intake 1764.93 ml  Output 1740 ml  Net 24.93 ml   Filed Weights   10/31/19 1825  Weight: 82.5 kg    Examination:  General exam: Appears calm and comfortable  Respiratory system: Clear to auscultation. Respiratory effort normal. Cardiovascular system: S1 & S2 heard, RRR. No JVD, murmurs, rubs, gallops or clicks. No pedal edema. Gastrointestinal system: Abdomen  is nondistended, soft and nontender. No organomegaly or masses felt. Normal bowel sounds heard.  Right percutaneous cholecystostomy tube in place.  Tube insertion site C/D/I Central  nervous system: Alert and oriented. No focal neurological deficits. Extremities: Symmetric 5 x 5 power. Skin: No rashes, lesions or ulcers Psychiatry: Judgement and insight appear normal. Mood & affect appropriate.     Data Reviewed: I have personally reviewed following labs and imaging studies  CBC: Recent Labs  Lab 10/31/19 1830 11/01/19 0953 11/02/19 0940 11/03/19 0717  WBC 14.5* 16.3* 14.3* 13.4*  NEUTROABS 12.5*  --   --   --   HGB 10.3* 10.1* 10.0* 9.8*  HCT 30.9* 30.4* 31.0* 29.1*  MCV 86.3 87.9 89.3 86.6  PLT 297 270 297 308   Basic Metabolic Panel: Recent Labs  Lab 10/31/19 1830 11/01/19 0953 11/02/19 0940 11/03/19 0717  NA 126* 137 135 136  K 4.7 4.2 3.6 3.3*  CL 91* 101 101 103  CO2 24 26 26 26   GLUCOSE 509* 355* 136* 149*  BUN 28* 23 18 11   CREATININE 1.46* 1.11 1.03 0.88  CALCIUM 9.1 9.1 8.7* 8.2*   GFR: Estimated Creatinine Clearance: 78.5 mL/min (by C-G formula based on SCr of 0.88 mg/dL). Liver Function Tests: Recent Labs  Lab 10/31/19 1830 11/01/19 0953 11/03/19 0717  AST 123* 66* 23  ALT 146* 116* 55*  ALKPHOS 242* 222* 158*  BILITOT 1.0 0.9 0.8  PROT 7.6 7.2 6.3*  ALBUMIN 2.7* 2.5* 2.2*   Recent Labs  Lab 11/03/19 0717  LIPASE 17   No results for input(s): AMMONIA in the last 168 hours. Coagulation Profile: Recent Labs  Lab 11/01/19 0953  INR 1.4*   Cardiac Enzymes: No results for input(s): CKTOTAL, CKMB, CKMBINDEX, TROPONINI in the last 168 hours. BNP (last 3 results) No results for input(s): PROBNP in the last 8760 hours. HbA1C: No results for input(s): HGBA1C in the last 72 hours. CBG: Recent Labs  Lab 11/04/19 1229 11/04/19 1733 11/04/19 2131 11/05/19 0748 11/05/19 1155  GLUCAP 141* 134* 182* 107* 196*   Lipid Profile: No results for input(s): CHOL, HDL, LDLCALC, TRIG, CHOLHDL, LDLDIRECT in the last 72 hours. Thyroid Function Tests: No results for input(s): TSH, T4TOTAL, FREET4, T3FREE, THYROIDAB in the last  72 hours. Anemia Panel: No results for input(s): VITAMINB12, FOLATE, FERRITIN, TIBC, IRON, RETICCTPCT in the last 72 hours. Sepsis Labs: Recent Labs  Lab 10/31/19 1830  PROCALCITON 1.82  LATICACIDVEN 1.6    Recent Results (from the past 240 hour(s))  Blood Culture (routine x 2)     Status: Abnormal   Collection Time: 10/31/19  6:30 PM   Specimen: BLOOD  Result Value Ref Range Status   Specimen Description   Final    BLOOD BLOOD RIGHT HAND Performed at [facility], [address], [place], [address] [place]    Special Requests   Final    BOTTLES DRAWN AEROBIC AND ANAEROBIC Blood Culture adequate volume Performed at [facility], [address] [place], [address] [place]    Culture  Setup Time   Final    GRAM POSITIVE COCCI IN BOTH AEROBIC AND ANAEROBIC BOTTLES CRITICAL RESULT CALLED TO, READ BACK BY AND VERIFIED WITH: SHERRIE [place] @0438  11/01/19 AKT    Culture (A)  Final    STREPTOCOCCUS GALLOLYTICUS ENTEROCOCCUS FAECIUM SUSCEPTIBILITIES PERFORMED ON PREVIOUS CULTURE WITHIN THE LAST 5 DAYS. Performed at [facility], [address] [place], [facility] [address]    Report Status 11/04/2019 FINAL  Final  Blood Culture (routine x 2)     Status: Abnormal   Collection Time: 10/31/19  6:30 PM   Specimen: BLOOD  Result Value Ref Range Status   Specimen Description   Final    BLOOD LEFT ANTECUBITAL Performed at [facility], [address] [place], [place] [address]    Special Requests   Final    BOTTLES DRAWN AEROBIC AND ANAEROBIC Blood Culture results may not be optimal due to an inadequate volume of blood received in culture bottles Performed at [facility], [address] [place], [place] [address]    Culture  Setup Time   Final    GRAM POSITIVE COCCI IN BOTH AEROBIC AND ANAEROBIC BOTTLES CRITICAL VALUE NOTED.  VALUE IS CONSISTENT WITH PREVIOUSLY REPORTED AND CALLED VALUE. Performed at [facility], [address] [place], [place] [address]    Culture STREPTOCOCCUS GALLOLYTICUS ENTEROCOCCUS FAECIUM  (A)  Final   Report Status 11/04/2019 FINAL  Final   Organism ID, Bacteria STREPTOCOCCUS GALLOLYTICUS  Final   Organism ID, Bacteria ENTEROCOCCUS FAECIUM  Final      Susceptibility   Enterococcus faecium - MIC*    AMPICILLIN <=2 SENSITIVE Sensitive     VANCOMYCIN <=0.5 SENSITIVE Sensitive     GENTAMICIN SYNERGY SENSITIVE Sensitive     * ENTEROCOCCUS FAECIUM   Streptococcus gallolyticus - MIC*    PENICILLIN 0.12 SENSITIVE Sensitive     CEFTRIAXONE 0.25 SENSITIVE Sensitive     ERYTHROMYCIN >=8 RESISTANT Resistant     LEVOFLOXACIN 2 SENSITIVE Sensitive     VANCOMYCIN 0.25 SENSITIVE Sensitive     * STREPTOCOCCUS GALLOLYTICUS  Blood Culture ID Panel (Reflexed)     Status: Abnormal   Collection Time: 10/31/19  6:30 PM  Result Value Ref Range Status   Enterococcus species DETECTED (A) NOT DETECTED Final    Comment: CRITICAL RESULT CALLED TO, READ BACK BY AND VERIFIED WITH: sherrie [place] @0438  11/01/19 akt    Vancomycin resistance NOT DETECTED NOT DETECTED Final   Listeria monocytogenes NOT DETECTED NOT DETECTED Final   Staphylococcus species NOT DETECTED NOT DETECTED Final   Staphylococcus aureus (BCID) NOT DETECTED NOT DETECTED Final   Streptococcus species DETECTED (A) NOT DETECTED Final    Comment: Not Enterococcus species, Streptococcus agalactiae, Streptococcus pyogenes, or Streptococcus pneumoniae. CRITICAL RESULT CALLED TO, READ BACK BY AND VERIFIED WITH: SHERRIE [place] @0438  11/01/19 AKT    Streptococcus agalactiae NOT DETECTED NOT DETECTED Final   Streptococcus pneumoniae NOT DETECTED NOT DETECTED Final   Streptococcus pyogenes NOT DETECTED NOT DETECTED Final   Acinetobacter baumannii NOT DETECTED NOT DETECTED Final   Enterobacteriaceae species NOT DETECTED NOT DETECTED Final   Enterobacter cloacae complex NOT DETECTED NOT DETECTED Final   Escherichia coli NOT DETECTED NOT  DETECTED Final   Klebsiella oxytoca NOT DETECTED NOT DETECTED Final   Klebsiella pneumoniae NOT DETECTED NOT DETECTED Final   Proteus species NOT DETECTED NOT DETECTED Final   Serratia marcescens NOT DETECTED NOT DETECTED Final   Haemophilus influenzae NOT DETECTED NOT DETECTED Final   Neisseria meningitidis NOT DETECTED NOT DETECTED Final   Pseudomonas aeruginosa NOT DETECTED NOT DETECTED Final   Candida albicans NOT DETECTED NOT DETECTED Final   Candida glabrata NOT DETECTED NOT DETECTED Final   Candida krusei NOT DETECTED NOT DETECTED Final   Candida parapsilosis NOT DETECTED NOT DETECTED Final   Candida tropicalis NOT DETECTED NOT DETECTED Final    Comment: Performed at [facility], [address] [place],  [place] [address]  Urine culture     Status: Abnormal   Collection Time: 10/31/19  7:55 PM   Specimen: In/Out Cath Urine  Result Value Ref Range Status   Specimen Description   Final    IN/OUT CATH URINE Performed at [facility], [address] [place], [place] [address]    Special Requests   Final    NONE Performed at [facility], [address] [place], [place] [address]    Culture (A)  Final    30,000 COLONIES/mL MULTIPLE SPECIES PRESENT, SUGGEST RECOLLECTION   Report Status 11/02/2019 FINAL  Final  Aerobic/Anaerobic Culture (surgical/deep wound)     Status: Abnormal (Preliminary result)   Collection Time: 11/01/19  3:23 PM   Specimen: Abdomen; Abscess  Result Value Ref Range Status   Specimen Description   Final    ABDOMEN Performed at [facility], [address] [place], [place] [address]    Special Requests   Final    Normal Performed at [facility], [address] [place], [place] [address]    Gram Stain   Final    ABUNDANT WBC PRESENT, PREDOMINANTLY PMN MODERATE GRAM POSITIVE COCCI IN PAIRS IN CLUSTERS RARE GRAM POSITIVE RODS Performed at [facility], [address], [place], [place]  [address]    Culture (A)  Final    MULTIPLE ORGANISMS PRESENT, NONE PREDOMINANT NO ANAEROBES ISOLATED; CULTURE IN PROGRESS FOR 5 DAYS    Report Status PENDING  Incomplete  Culture, blood (Routine X 2) w Reflex to ID Panel     Status: None (Preliminary result)   Collection Time: 11/02/19  1:11 AM   Specimen: BLOOD  Result Value Ref Range Status   Specimen Description BLOOD BLOOD RIGHT HAND  Final   Special Requests   Final    BOTTLES DRAWN AEROBIC AND ANAEROBIC Blood Culture adequate volume   Culture   Final    NO GROWTH 3 DAYS Performed at [facility], [address], [place], [place] [address]    Report Status PENDING  Incomplete  Culture, blood (Routine X 2) w Reflex to ID Panel     Status: None (Preliminary result)   Collection Time: 11/02/19  1:17 AM   Specimen: BLOOD  Result Value Ref Range Status   Specimen Description BLOOD BLOOD LEFT FOREARM  Final   Special Requests   Final    BOTTLES DRAWN AEROBIC AND ANAEROBIC Blood Culture adequate volume   Culture   Final    NO GROWTH 3 DAYS Performed at [facility], [address] [place], [place] [address]    Report Status PENDING  Incomplete  MRSA PCR Screening     Status: None   Collection Time: 11/02/19 12:53 PM   Specimen: Nasopharyngeal  Result Value Ref Range Status   MRSA by PCR NEGATIVE NEGATIVE Final    Comment:        The GeneXpert MRSA Assay (FDA approved for NASAL specimens only), is one component of a comprehensive MRSA colonization surveillance program. It is not intended to diagnose MRSA infection nor to guide or monitor treatment for MRSA infections. Performed at [facility], [address], [place], [place] [address]          Radiology Studies: No results found.      Scheduled Meds: . amLODipine  10 mg Oral Daily  . aspirin EC  81 mg Oral Daily  . atorvastatin  40 mg Oral q1800  . heparin  5,000 Units Subcutaneous Q8H  . insulin aspart  0-15 Units  Subcutaneous TID WC  . insulin detemir  10 Units Subcutaneous QHS  . polyethylene glycol  17 g Oral Daily  . primidone  50 mg Oral QID  . senna-docusate  1 tablet Oral BID   Continuous Infusions: . ampicillin-sulbactam (UNASYN) IV 3 g (11/05/19 1249)  . lactated ringers Stopped (11/02/19 0521)     LOS: 5 days    Time spent: 35 minutes    Sidney Ace, MD [facility] Pager [phone]  If 7PM-7AM, please contact night-coverage 11/05/2019, 1:34 PM

## 2019-11-05 NOTE — Progress Notes (Signed)
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  Work-up in the ED reveals fever, leukocytosis and CT shows acute on chronic cholecystitis with massively enlarged gallbladder and possible hepatic abscess.  General surgery was consulted and recommended IR drainage of abscess with percutaneous cholecystotomy. Patient underwent cholecystotomy yesterday and they drained 450 cc blood-tinged pus    PT Comments    Pt alert, agreeable to PT with min encouragement. Pt performed supine to sit with HOB elevated, supervision. Sit <> stand with RW and CGA, cued for hand placement to increase safety (slightly elevated bed surface). Ambulated ~63ft, very decreased gait velocity, CGA with RW. No LOB noted, narrow base of support. Tended to lift RW off floor due to squeaking per pt, and ambulated with RW outside base of support. Up in chair at end of session. The patient would benefit from further skilled PT intervention to continue to progress towards goals. Recommendation remains appropriate.     Follow Up Recommendations  Home health PT     Equipment Recommendations  Rolling walker with 5" wheels    Recommendations for Other Services       Precautions / Restrictions Precautions Precautions: Fall Precaution Comments: abdominal drain Restrictions Weight Bearing Restrictions: No    Mobility  Bed Mobility Overal bed mobility: Needs Assistance Bed Mobility: Supine to Sit     Supine to sit: Supervision;HOB elevated     General bed mobility comments: no physical assist needed  Transfers Overall transfer level: Needs assistance Equipment used: Rolling walker (2 wheeled) Transfers: Sit to/from Stand Sit to Stand: Min guard          General transfer comment: cues for hand placement to increase safety  Ambulation/Gait Ambulation/Gait assistance: Min guard Gait Distance (Feet): 30 Feet Assistive device: Rolling walker (2 wheeled)   Gait velocity: significantly decreased   General Gait Details: Pt did not want to don/did not have mask in room to ambulate in hallway.   Stairs             Wheelchair Mobility    Modified Rankin (Stroke Patients Only)       Balance Overall balance assessment: Needs assistance Sitting-balance support: No upper extremity supported;Feet supported Sitting balance-Leahy Scale: Good     Standing balance support: Bilateral upper extremity supported Standing balance-Leahy Scale: Fair                              Cognition Arousal/Alertness: Awake/alert Behavior During Therapy: WFL for tasks assessed/performed Overall Cognitive Status: Within Functional Limits for tasks assessed                                        Exercises      General Comments        Pertinent Vitals/Pain Pain Assessment: 0-10 Pain Score: 5  Pain Location: site of biliary tube Pain Descriptors / Indicators: Sore Pain Intervention(s): Limited activity within patient's tolerance;Monitored during session;Repositioned    Home Living                      Prior Function  PT Goals (current goals can now be found in the care plan section) Progress towards PT goals: Progressing toward goals    Frequency    Min 2X/week      PT Plan Current plan remains appropriate    Co-evaluation              AM-PAC PT "6 Clicks" Mobility   Outcome Measure  Help needed turning from your back to your side while in a flat bed without using bedrails?: A Little Help needed moving from lying on your back to sitting on the side of a flat bed without using bedrails?: A Little Help needed moving to and from a bed to a chair (including a wheelchair)?: A  Little Help needed standing up from a chair using your arms (e.g., wheelchair or bedside chair)?: A Little Help needed to walk in hospital room?: A Little Help needed climbing 3-5 steps with a railing? : A Lot 6 Click Score: 17    End of Session Equipment Utilized During Treatment: Gait belt Activity Tolerance: Patient tolerated treatment well Patient left: in chair;with call bell/phone within reach Nurse Communication: Mobility status PT Visit Diagnosis: Unsteadiness on feet (R26.81);Muscle weakness (generalized) (M62.81);History of falling (Z91.81);Difficulty in walking, not elsewhere classified (R26.2)     Time: 4034-7425 PT Time Calculation (min) (ACUTE ONLY): 18 min  Charges:  $Therapeutic Exercise: 8-22 mins                     Lieutenant Diego PT, DPT 2:02 PM,11/05/19

## 2019-11-05 NOTE — Evaluation (Signed)
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  Work-up in the ED reveals fever, leukocytosis and CT shows acute on chronic cholecystitis with massively enlarged gallbladder and possible hepatic abscess.  General surgery was consulted and recommended IR drainage of abscess with percutaneous cholecystotomy. Patient underwent cholecystotomy yesterday and they drained 450 cc blood-tinged pus   Clinical Impression   Kyle Ritter was seen for OT evaluation this date. Prior to hospital admission, pt was MOD I for I/ADLs using AD as needed. Pt lives alone in mobile home c ramped entrance. Pt presents to acute OT demonstrating impaired ADL performance and functional mobility 2/2 functional balance/strength/endurance deficits and pain. Pt currently requires CGA + RW for in room functional mobility including toilet t/f at standard commode, SUPERVISION only for toileting including perihygiene with lateral leans. CGA + RW hand washing standing sink side, pt demonstrated decreased ability to reach outside BOS. Pt would benefit from skilled OT to address noted impairments and functional limitations (see below for any additional details) in order to maximize safety and independence while minimizing falls risk and caregiver burden. Upon hospital discharge, recommend HHOT to maximize pt safety and return to functional independence during meaningful occupations of daily life.     Follow Up Recommendations  Home health OT;Supervision/Assistance - 24 hour    Equipment Recommendations  3 in 1 bedside commode    Recommendations for Other Services       Precautions / Restrictions Precautions Precautions: Fall Restrictions Weight Bearing Restrictions: No       Mobility Bed Mobility Overal bed mobility: Needs Assistance Bed Mobility: Supine to Sit;Sit to Supine     Supine to sit: Supervision;HOB elevated Sit to supine: Supervision(flat bed, increased time)      Transfers Overall transfer level: Needs assistance Equipment used: Rolling walker (2 wheeled) Transfers: Sit to/from Stand Sit to Stand: Min guard              Balance Overall balance assessment: Needs assistance Sitting-balance support: No upper extremity supported;Feet supported Sitting balance-Leahy Scale: Good     Standing balance support: Bilateral upper extremity supported Standing balance-Leahy Scale: Fair                             ADL either performed or assessed with clinical judgement   ADL Overall ADL's : Needs assistance/impaired                                       General ADL Comments: Supervision + R grab bar toileting at commode including perihygiene c lateral leans. CGA + RW hand washing standing sinkside - limited reach outside BOS and increased time to pinch paper towel functionally. SBA for LB access seated EOB.      Vision         Perception     Praxis      Pertinent Vitals/Pain Pain Assessment: 0-10 Pain Score: 10-Worst pain ever Pain Location: site of biliary tube Pain Intervention(s): Patient requesting pain meds-RN notified;RN gave pain meds during session;Limited activity within patient's tolerance     Hand Dominance     Extremity/Trunk Assessment Upper Extremity Assessment Upper Extremity  Assessment: Overall WFL for tasks assessed   Lower Extremity Assessment Lower Extremity Assessment: Overall WFL for tasks assessed       Communication Communication Communication: No difficulties   Cognition Arousal/Alertness: Awake/alert Behavior During Therapy: WFL for tasks assessed/performed Overall Cognitive Status: Within Functional Limits for tasks assessed                                      General Comments       Exercises Exercises: Other exercises Other Exercises Other Exercises: Pt educated re: RW technique, OT role, d/c recommendations, falls prevention, and DME recommendations Other Exercises: Toileting at standard commode, sup>sit, sit<>stand x2, hand washing, sitting/standing balance/tolerance   Shoulder Instructions      Home Living Family/patient expects to be discharged to:: Private residence Living Arrangements: Alone Available Help at Discharge: Family;Available PRN/intermittently(sister nearby, checks in daily) Type of Home: Mobile home Home Access: Ramped entrance     Home Layout: One level     Bathroom Shower/Tub: Chief Strategy Officer: Handicapped height     Home Equipment: Environmental consultant - 2 wheels          Prior Functioning/Environment Level of Independence: Independent with assistive device(s)        Comments: Pt endorses ~3 falls in last 2 months. Sister assists for grocery shopping. Pt golfs and does yard work 1x/week        OT Problem List: Decreased strength;Decreased activity tolerance;Decreased knowledge of use of DME or AE      OT Treatment/Interventions: Self-care/ADL training;Therapeutic exercise;Energy conservation;DME and/or AE instruction;Therapeutic activities;Patient/family education;Balance training    OT Goals(Current goals can be found in the care plan section) Acute Rehab OT Goals Patient Stated Goal: to walk better OT Goal Formulation: With patient Time For Goal Achievement: 11/19/19 Potential to Achieve Goals: Good ADL Goals Pt Will Perform Lower Body Dressing: with supervision;sit to/from stand(c LRAD PRN) Pt Will Transfer to Toilet: with modified independence;ambulating;regular height toilet(c LRAD PRN) Pt Will Perform Toileting - Clothing Manipulation and hygiene: with supervision;sit to/from stand  OT Frequency: Min 2X/week   Barriers to D/C: Decreased caregiver support           Co-evaluation              AM-PAC OT "6 Clicks" Daily Activity     Outcome Measure Help from another person eating meals?: A Little Help from another person taking care of personal grooming?: A Little Help from another person toileting, which includes using toliet, bedpan, or urinal?: A Little Help from another person bathing (including washing, rinsing, drying)?: A Little Help from another person to put on and taking off regular upper body clothing?: A Little Help from another person to put on and taking off regular lower body clothing?: A Little 6 Click Score: 18   End of Session Equipment Utilized During Treatment: Rolling walker;Gait belt  Activity Tolerance: Patient tolerated treatment well Patient left: in bed;with call bell/phone within reach  OT Visit Diagnosis: Unsteadiness on feet (R26.81);Other abnormalities of gait and mobility (R26.89)                Time: 3532-9924 OT Time Calculation (min): 43 min Charges:  OT General Charges $OT Visit: 1 Visit OT Evaluation $OT Eval Moderate Complexity: 1 Mod OT Treatments $Self Care/Home Management : 23-37 mins  Kathie Dike, M.S. OTR/L  11/05/19, 9:51 AM

## 2019-11-05 NOTE — Progress Notes (Signed)
Recent events: CT from today demonstrates enlarging pericholecystic / hepatic abscess despite having a drainage catheter.   Objective: Vital signs in last 24 hours: Temp:  [98.1 F (36.7 C)-99.3 F (37.4 C)] 98.1 F (36.7 C) (04/12 1157) Pulse Rate:  [69-77] 77 (04/12 1157) Resp:  [18] 18 (04/12 1157) BP: (144-157)/(73-75) 152/75 (04/12 1157) SpO2:  [95 %-98 %] 95 % (04/12 1157) Weight:  [80.8 kg] 80.8 kg (04/12 1638) Last BM Date: 10/30/19  Intake/Output from previous day: 04/11 0701 - 04/12 0700 In: 1764.9 [I.V.:1354.9; IV Piggyback:400] Out: 8546 [Urine:1700; Drains:110] Intake/Output this shift: Total I/O In: -  Out: 430 [Urine:400; Drains:30]  Right abdominal drain intact.  Brown thick fluid coming out of drain. Drain was rigorous flushed with saline and removed 400 ml of thick brown fluid.  Lab Results:  Recent Labs    11/03/19 0717  WBC 13.4*  HGB 9.8*  HCT 29.1*  PLT 308   BMET Recent Labs    11/03/19 0717  NA 136  K 3.3*  CL 103  CO2 26  GLUCOSE 149*  BUN 11  CREATININE 0.88  CALCIUM 8.2*   PT/INR No results for input(s): LABPROT, INR in the last 72 hours. ABG No results for input(s): PHART, HCO3 in the last 72 hours.  Invalid input(s): PCO2, PO2  Studies/Results: CT ABDOMEN W CONTRAST  Result Date: 11/05/2019 CLINICAL DATA:  Right upper quadrant pain. Elevated white blood cell count. Fever. Re-evaluation of perforated cholecystitis, status post cholecystostomy. Cholecystostomy 4 days ago. EXAM: CT ABDOMEN WITH CONTRAST TECHNIQUE: Multidetector CT imaging of the abdomen was performed using the standard protocol following bolus administration of intravenous contrast. CONTRAST:  123mL OMNIPAQUE IOHEXOL 300 MG/ML  SOLN COMPARISON:  10/31/2019 FINDINGS: Lower chest: Clear lung bases. Mild cardiomegaly with multivessel coronary artery atherosclerosis. Trace right greater than left pleural effusions are new since the prior CT. Hepatobiliary: No  focal liver lesion. Interval cholecystostomy. Redemonstration of gallbladder distension she is minimally improved. At least 1 gallstone. Ill definition is of portions of the gallbladder wall, consistent with perforated cholecystitis, as before. Extension of gas and fluid in the segment 4 of the liver, including at 4.6 x 4.0 cm on 23/2. Compare 4.2 x 3.6 cm at the same level on the prior exam (when remeasured). Medial extension of presumed extraluminal fluid including on coronal image 41, similar. Residual pericholecystic edema, including inferiorly on 40/2. No intrahepatic biliary duct dilatation. Common duct is suboptimally evaluated along its pericholecystic course. No calcified common duct stone. Pancreas: Normal pancreas for age, without duct dilatation or acute inflammation. Spleen: Normal in size, without focal abnormality. Adrenals/Urinary Tract: Normal adrenal glands. Bilateral small low-density renal lesions are likely cysts. No hydronephrosis. Stomach/Bowel: Normal stomach, without wall thickening. Scattered colonic diverticula. Normal terminal ileum and appendix. Normal abdominal small bowel. Vascular/Lymphatic: Aortic atherosclerosis. Patent portal hepatic, splenic veins. No retroperitoneal or retrocrural adenopathy. Other: No ascites.  No free intraperitoneal air. Musculoskeletal: Lower thoracic and upper lumbar spondylosis IMPRESSION: 1. Status post cholecystostomy with persistent findings of perforated cholecystitis. Slight increase in developing pericholecystic hepatic abscess or abscesses. 2. No biliary duct dilatation or convincing evidence of choledocholithiasis. 3. New tiny bilateral pleural effusions. 4.  Aortic Atherosclerosis (ICD10-I70.0). Electronically Signed   By: Abigail Miyamoto M.D.   On: 11/05/2019 15:04    Anti-infectives: Anti-infectives (From admission, onward)   Start     Dose/Rate Route Frequency Ordered Stop   11/01/19 1200  Ampicillin-Sulbactam (UNASYN) 3 g in sodium chloride  0.9 % 100  mL IVPB     3 g 200 mL/hr over 30 Minutes Intravenous Every 6 hours 11/01/19 0920     11/01/19 0800  ceFEPIme (MAXIPIME) 2 g in sodium chloride 0.9 % 100 mL IVPB  Status:  Discontinued     2 g 200 mL/hr over 30 Minutes Intravenous Every 12 hours 10/31/19 2304 11/01/19 0920   11/01/19 0400  metroNIDAZOLE (FLAGYL) IVPB 500 mg  Status:  Discontinued     500 mg 100 mL/hr over 60 Minutes Intravenous Every 8 hours 10/31/19 2332 11/01/19 0920   10/31/19 2330  vancomycin (VANCOCIN) IVPB 1000 mg/200 mL premix     1,000 mg 200 mL/hr over 60 Minutes Intravenous  Once 10/31/19 2329 11/01/19 0148   10/31/19 2315  vancomycin (VANCOREADY) IVPB 1250 mg/250 mL  Status:  Discontinued     1,250 mg 166.7 mL/hr over 90 Minutes Intravenous Every 24 hours 10/31/19 2304 10/31/19 2329   10/31/19 1945  ceFEPIme (MAXIPIME) 2 g in sodium chloride 0.9 % 100 mL IVPB     2 g 200 mL/hr over 30 Minutes Intravenous  Once 10/31/19 1932 10/31/19 2118   10/31/19 1945  metroNIDAZOLE (FLAGYL) IVPB 500 mg     500 mg 100 mL/hr over 60 Minutes Intravenous  Once 10/31/19 1932 10/31/19 2118   10/31/19 1945  vancomycin (VANCOCIN) IVPB 1000 mg/200 mL premix     1,000 mg 200 mL/hr over 60 Minutes Intravenous  Once 10/31/19 1932 10/31/19 2118      Assessment/Plan: 74 yo with cholecystitis complicated by gallbladder perforation and hepatic abscesses.  Large residual collection/absceseses on today's CT. I was able to aspirate 400 ml of thick brown fluid from drain after rigorous flushing.  Will need to re-assess the collections and drain tomorrow in CT.  Likely need a larger drain and probably additional drains in order to adequately control the infection.     Arn Medal 11/05/2019

## 2019-11-06 ENCOUNTER — Inpatient Hospital Stay: Payer: Medicare Other

## 2019-11-06 DIAGNOSIS — K812 Acute cholecystitis with chronic cholecystitis: Secondary | ICD-10-CM | POA: Diagnosis not present

## 2019-11-06 DIAGNOSIS — R7881 Bacteremia: Secondary | ICD-10-CM | POA: Diagnosis not present

## 2019-11-06 DIAGNOSIS — A419 Sepsis, unspecified organism: Secondary | ICD-10-CM | POA: Diagnosis not present

## 2019-11-06 LAB — GLUCOSE, CAPILLARY
Glucose-Capillary: 112 mg/dL — ABNORMAL HIGH (ref 70–99)
Glucose-Capillary: 180 mg/dL — ABNORMAL HIGH (ref 70–99)
Glucose-Capillary: 167 mg/dL — ABNORMAL HIGH (ref 70–99)
Glucose-Capillary: 176 mg/dL — ABNORMAL HIGH (ref 70–99)

## 2019-11-06 MED ORDER — FENTANYL CITRATE (PF) 100 MCG/2ML IJ SOLN
INTRAMUSCULAR | Status: AC
  Filled 2019-11-06: qty 2

## 2019-11-06 MED ORDER — SODIUM CHLORIDE 0.9% FLUSH
5.0000 mL | Freq: Three times a day (TID) | INTRAVENOUS | Status: DC
  Administered 2019-11-06 – 2019-11-07 (×3): 5 mL

## 2019-11-06 MED ORDER — SODIUM CHLORIDE 0.9% FLUSH
5.0000 mL | Freq: Three times a day (TID) | INTRAVENOUS | Status: DC
  Administered 2019-11-06 – 2019-11-11 (×13): 5 mL

## 2019-11-06 MED ORDER — HEPARIN SODIUM (PORCINE) 5000 UNIT/ML IJ SOLN
5000.0000 [IU] | Freq: Three times a day (TID) | INTRAMUSCULAR | Status: DC
  Administered 2019-11-07 – 2019-11-11 (×13): 5000 [IU] via SUBCUTANEOUS
  Filled 2019-11-06 (×13): qty 1

## 2019-11-06 MED ORDER — MIDAZOLAM HCL 2 MG/2ML IJ SOLN
INTRAMUSCULAR | Status: AC
  Filled 2019-11-06: qty 2

## 2019-11-06 NOTE — Progress Notes (Signed)
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  Work-up in the ED reveals fever, leukocytosis and CT shows acute on chronic cholecystitis with massively enlarged gallbladder and possible hepatic abscess.  General surgery was consulted and recommended IR drainage of abscess with percutaneous cholecystotomy. Patient underwent cholecystotomy yesterday and they drained 450 cc blood-tinged pus    PT Comments    Patient alert, up in bathroom upon PT entering room, utilizing commode in standing to urinate. Pt agreeable to ambulation with PT, ambulated ~64ft total with RW and supervision/CGA. Pt instructed in RW use, difficulty with forward momentum, often lifts the walker; puts weight through UEs takes a few steps, lifts walker, etc. fair carryover of education on letting the walker glide, and take standing rest breaks. Pt requested to sit EOB at end of session, assisted to open orange juice. Pt with all needs in reach. The patient would benefit from further skilled PT intervention to continue to progress towards goals. Recommendation remains appropriate.      Follow Up Recommendations  Home health PT     Equipment Recommendations  Rolling walker with 5" wheels    Recommendations for Other Services       Precautions / Restrictions Precautions Precautions: Fall Precaution Comments: abdominal drain Restrictions Weight Bearing Restrictions: No    Mobility  Bed Mobility               General bed mobility comments: pt sitting EOB at end of session  Transfers Overall transfer level: Needs assistance Equipment used: Rolling walker (2 wheeled) Transfers: Sit to/from Stand Sit to Stand: Supervision             Ambulation/Gait Ambulation/Gait assistance: Min guard;Supervision Gait Distance (Feet): 70 Feet Assistive device: Rolling walker (2 wheeled)   Gait velocity: significantly decreased   General Gait Details: Pt instructed in RW use, difficulty with forward momentum, often lifts the walker; puts weight through UEs takes a few steps, lifts walker, etc. fair carryover of education on letting the walker glide, and take standing rest breaks.   Stairs             Wheelchair Mobility    Modified Rankin (Stroke Patients Only)       Balance Overall balance assessment: Needs assistance Sitting-balance support: Feet supported Sitting balance-Leahy Scale: Good       Standing balance-Leahy Scale: Fair                              Cognition Arousal/Alertness: Awake/alert Behavior During Therapy: WFL for tasks assessed/performed Overall Cognitive Status: Within Functional Limits for tasks assessed                                        Exercises      General Comments        Pertinent Vitals/Pain Pain Assessment: 0-10 Pain Score: 5  Pain Location: drain site Pain Descriptors / Indicators: Sore;Guarding;Grimacing Pain Intervention(s): Limited activity within patient's tolerance;Monitored during session;Repositioned    Home Living                      Prior Function  PT Goals (current goals can now be found in the care plan section) Progress towards PT goals: Progressing toward goals    Frequency    Min 2X/week      PT Plan Current plan remains appropriate    Co-evaluation              AM-PAC PT "6 Clicks" Mobility   Outcome Measure  Help needed turning from your back to your side while in a flat bed without using bedrails?: A Little Help needed moving from lying on your back to sitting on the side of a flat bed without using bedrails?: A Little Help needed moving to and from a bed to a chair  (including a wheelchair)?: A Little Help needed standing up from a chair using your arms (e.g., wheelchair or bedside chair)?: A Little Help needed to walk in hospital room?: A Little Help needed climbing 3-5 steps with a railing? : A Little 6 Click Score: 18    End of Session Equipment Utilized During Treatment: Gait belt Activity Tolerance: Patient tolerated treatment well Patient left: in bed;with call bell/phone within reach(sitting EOB, RN aware) Nurse Communication: Mobility status PT Visit Diagnosis: Unsteadiness on feet (R26.81);Muscle weakness (generalized) (M62.81);History of falling (Z91.81);Difficulty in walking, not elsewhere classified (R26.2)     Time: 9323-5573 PT Time Calculation (min) (ACUTE ONLY): 12 min  Charges:  $Gait Training: 8-22 mins                     Lieutenant Diego PT, DPT 11:32 AM,11/06/19

## 2019-11-06 NOTE — Progress Notes (Signed)
Pt stable after tube exchange.VSS.Abd-benign.F/U with his MD.

## 2019-11-06 NOTE — TOC Progression Note (Signed)
Transition of Care [facility]) - Progression Note    Patient Details  Name: Kyle Ritter MRN: [id number] Date of Birth: January 31, 1946  [facility] of [facility]) CM/SW Benjamin, LCSW Phone Number: 11/06/2019, 9:45 AM  Clinical Narrative: Met with patient. He has not chosen home health agency preferences but said his sister did review the list yesterday. Patient stated his sister will be here at some point today. Nurse will call CSW when she arrives.    Expected Discharge Plan: [place] Barriers to Discharge: Continued Medical Work up  Expected Discharge Plan and Services Expected Discharge Plan: [place] Choice: [place] arrangements for the past 2 months: Mobile Home                                       Social Determinants of Health (SDOH) Interventions    Readmission Risk Interventions No flowsheet data found.

## 2019-11-06 NOTE — Progress Notes (Signed)
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  Work-up in the ED reveals fever, leukocytosis and CT shows acute on chronic cholecystitis with massively enlarged gallbladder and possible hepatic abscess.  General surgery was consulted and recommended IR drainage of abscess with percutaneous cholecystotomy.  4/10: Patient seen and examined.  Wife at bedside.  Patient feels well.  Sensitivities pending from blood cultures.  Remains on antibiotics.  White count decreasing.  No fevers over interval.  4/11: Patient seen and examined.  No family members at bedside this morning.  Patient feels well.  No complaints plaints.  Culture and sensitivity resulted.  Blood culture positive for Streptococcus gallolyticus and Enterococcus faecium.  Remains on Unasyn.  No fevers noted over interval.  4/12: Patient seen and examined.  No family members at bedside this morning.  Patient feels well.  No complaints.  Continues on Unasyn.  No fevers noted over interval.  Pending infectious disease follow-up.  4/13: Patient seen and examined.  Status post drain exchange by interventional radiology for enlarging pericholecystic hepatic abscesses.  Tolerated procedure well.  Minimal postprocedural pain.  Continues on Unasyn.  Infectious disease following.  Known fevers noted over interval.   Assessment & Plan:   Active Problems:   Acute on chronic cholecystitis  Gallbladder abscess Patient underwent cholecystotomy  and they drained 450 cc blood-tinged pus Patient tolerated procedure well Cultures with Enterococcus and Streptococcus Patient continues on Unasyn for enterococcal/streptococcal bacteremia. Repeat CAT scan demonstrating enlarging pericholecystic hepatic abscess Interventional  radiology reconsulted Status post drain exchange on 11/06/2019 Continue to follow drain output Continue Unasyn, follow infectious disease recommendations  Sepsis secondary to enterococcal and streptococcal bacteremia Blood culture on admission positive for Enterococcus and Streptococcus Patient remains on Unasyn, currently day 5 Surveillance blood cultures have been negative Anticipate patient will require PICC line placement.  Will need to ensure source control.  Follow infectious disease recommendations regarding antibiotic choice and timing of long-term IV access  DM2 SSI 0 to 15 units AC and at bedtime Metformin being held  AKI Creatinine much improved with treatment of infection and hydration  Hyponatremia Sodium normalized with hydration.  DVT prophylaxis: LMWH Code Status: FULL Family Communication: attempted to call sister Wyline Mood [phone] on 4/13.  No answer, could not leave VM Disposition Plan: Anticipate home with home health.  Patient remains on IV antibiotics at this time.  Anticipate he will need PICC line for longer term IV antibiotics.  Cholecystostomy drain placed, then upsized on 11/06/2019.  Pending further infectious disease recommendations.  Consultants:   ID  Procedures:   Percutaneous cholecystostomy  Antimicrobials:   Unasyn   Subjective: Seen and examined No family at bedside this morning Patient feels well, no complaints  Objective: Vitals:   11/06/19 0812 11/06/19 0824 11/06/19 0826 11/06/19 0830  BP: (!) 145/61 (!) 141/61 (!) 142/65 (!) 144/65  Pulse: 71 71 72 71  Resp: 20 14 15 14   Temp:      TempSrc:      SpO2: 98% 96% 96% 95%  Weight:      Height:        Intake/Output Summary (Last 24 hours) at 11/06/2019 1253 Last data filed at 11/06/2019 0526 Gross per 24 hour  Intake 411.37 ml  Output 400 ml  Net 11.37  ml   Filed Weights   10/31/19 1825 11/05/19 1638  Weight: 82.5 kg 80.8 kg    Examination:  General exam:  Appears calm and comfortable  Respiratory system: Clear to auscultation. Respiratory effort normal. Cardiovascular system: S1 & S2 heard, RRR. No JVD, murmurs, rubs, gallops or clicks. No pedal edema. Gastrointestinal system: Abdomen is nondistended, soft and nontender. No organomegaly or masses felt. Normal bowel sounds heard.  Right percutaneous cholecystostomy tube in place.  Tube insertion site C/D/I Central nervous system: Alert and oriented. No focal neurological deficits. Extremities: Symmetric 5 x 5 power. Skin: No rashes, lesions or ulcers Psychiatry: Judgement and insight appear normal. Mood & affect appropriate.     Data Reviewed: I have personally reviewed following labs and imaging studies  CBC: Recent Labs  Lab 10/31/19 1830 11/01/19 0953 11/02/19 0940 11/03/19 0717  WBC 14.5* 16.3* 14.3* 13.4*  NEUTROABS 12.5*  --   --   --   HGB 10.3* 10.1* 10.0* 9.8*  HCT 30.9* 30.4* 31.0* 29.1*  MCV 86.3 87.9 89.3 86.6  PLT 297 270 297 161   Basic Metabolic Panel: Recent Labs  Lab 10/31/19 1830 11/01/19 0953 11/02/19 0940 11/03/19 0717  NA 126* 137 135 136  K 4.7 4.2 3.6 3.3*  CL 91* 101 101 103  CO2 24 26 26 26   GLUCOSE 509* 355* 136* 149*  BUN 28* 23 18 11   CREATININE 1.46* 1.11 1.03 0.88  CALCIUM 9.1 9.1 8.7* 8.2*   GFR: Estimated Creatinine Clearance: 78.5 mL/min (by C-G formula based on SCr of 0.88 mg/dL). Liver Function Tests: Recent Labs  Lab 10/31/19 1830 11/01/19 0953 11/03/19 0717  AST 123* 66* 23  ALT 146* 116* 55*  ALKPHOS 242* 222* 158*  BILITOT 1.0 0.9 0.8  PROT 7.6 7.2 6.3*  ALBUMIN 2.7* 2.5* 2.2*   Recent Labs  Lab 11/03/19 0717  LIPASE 17   No results for input(s): AMMONIA in the last 168 hours. Coagulation Profile: Recent Labs  Lab 11/01/19 0953  INR 1.4*   Cardiac Enzymes: No results for input(s): CKTOTAL, CKMB, CKMBINDEX, TROPONINI in the last 168 hours. BNP (last 3 results) No results for input(s): PROBNP in the last 8760  hours. HbA1C: No results for input(s): HGBA1C in the last 72 hours. CBG: Recent Labs  Lab 11/05/19 1155 11/05/19 1648 11/05/19 2105 11/06/19 0930 11/06/19 1148  GLUCAP 196* 165* 184* 112* 180*   Lipid Profile: No results for input(s): CHOL, HDL, LDLCALC, TRIG, CHOLHDL, LDLDIRECT in the last 72 hours. Thyroid Function Tests: No results for input(s): TSH, T4TOTAL, FREET4, T3FREE, THYROIDAB in the last 72 hours. Anemia Panel: No results for input(s): VITAMINB12, FOLATE, FERRITIN, TIBC, IRON, RETICCTPCT in the last 72 hours. Sepsis Labs: Recent Labs  Lab 10/31/19 1830  PROCALCITON 1.82  LATICACIDVEN 1.6    Recent Results (from the past 240 hour(s))  Blood Culture (routine x 2)     Status: Abnormal   Collection Time: 10/31/19  6:30 PM   Specimen: BLOOD  Result Value Ref Range Status   Specimen Description   Final    BLOOD BLOOD RIGHT HAND Performed at [facility], [address], [place], [place] [address]    Special Requests   Final    BOTTLES DRAWN AEROBIC AND ANAEROBIC Blood Culture adequate volume Performed at [place] [place],  [address]    Culture  Setup Time   Final    GRAM POSITIVE COCCI IN BOTH AEROBIC AND ANAEROBIC BOTTLES CRITICAL RESULT  CALLED TO, READ BACK BY AND VERIFIED WITH: SHERRIE THOMPSON @0438  11/01/19 AKT    Culture (A)  Final    STREPTOCOCCUS GALLOLYTICUS ENTEROCOCCUS FAECIUM SUSCEPTIBILITIES PERFORMED ON PREVIOUS CULTURE WITHIN THE LAST 5 DAYS. Performed at [facility], [address] [place], [place] [address]    Report Status 11/04/2019 FINAL  Final  Blood Culture (routine x 2)     Status: Abnormal   Collection Time: 10/31/19  6:30 PM   Specimen: BLOOD  Result Value Ref Range Status   Specimen Description   Final    BLOOD LEFT ANTECUBITAL Performed at [facility], [address] [place], [place] [address]    Special Requests   Final    BOTTLES DRAWN AEROBIC AND ANAEROBIC  Blood Culture results may not be optimal due to an inadequate volume of blood received in culture bottles Performed at [facility], [address] [place], [place] [address]    Culture  Setup Time   Final    GRAM POSITIVE COCCI IN BOTH AEROBIC AND ANAEROBIC BOTTLES CRITICAL VALUE NOTED.  VALUE IS CONSISTENT WITH PREVIOUSLY REPORTED AND CALLED VALUE. Performed at [facility], [address] [place], [place] [address]    Culture STREPTOCOCCUS GALLOLYTICUS ENTEROCOCCUS FAECIUM  (A)  Final   Report Status 11/04/2019 FINAL  Final   Organism ID, Bacteria STREPTOCOCCUS GALLOLYTICUS  Final   Organism ID, Bacteria ENTEROCOCCUS FAECIUM  Final      Susceptibility   Enterococcus faecium - MIC*    AMPICILLIN <=2 SENSITIVE Sensitive     VANCOMYCIN <=0.5 SENSITIVE Sensitive     GENTAMICIN SYNERGY SENSITIVE Sensitive     * ENTEROCOCCUS FAECIUM   Streptococcus gallolyticus - MIC*    PENICILLIN 0.12 SENSITIVE Sensitive     CEFTRIAXONE 0.25 SENSITIVE Sensitive     ERYTHROMYCIN >=8 RESISTANT Resistant     LEVOFLOXACIN 2 SENSITIVE Sensitive     VANCOMYCIN 0.25 SENSITIVE Sensitive     * STREPTOCOCCUS GALLOLYTICUS  Blood Culture ID Panel (Reflexed)     Status: Abnormal   Collection Time: 10/31/19  6:30 PM  Result Value Ref Range Status   Enterococcus species DETECTED (A) NOT DETECTED Final    Comment: CRITICAL RESULT CALLED TO, READ BACK BY AND VERIFIED WITH: sherrie thompson @0438  11/01/19 akt    Vancomycin resistance NOT DETECTED NOT DETECTED Final   Listeria monocytogenes NOT DETECTED NOT DETECTED Final   Staphylococcus species NOT DETECTED NOT DETECTED Final   Staphylococcus aureus (BCID) NOT DETECTED NOT DETECTED Final   Streptococcus species DETECTED (A) NOT DETECTED Final    Comment: Not Enterococcus species, Streptococcus agalactiae, Streptococcus pyogenes, or Streptococcus pneumoniae. CRITICAL RESULT CALLED TO, READ BACK BY AND VERIFIED WITH: SHERRIE THOMPSON @0438   11/01/19 AKT    Streptococcus agalactiae NOT DETECTED NOT DETECTED Final   Streptococcus pneumoniae NOT DETECTED NOT DETECTED Final   Streptococcus pyogenes NOT DETECTED NOT DETECTED Final   Acinetobacter baumannii NOT DETECTED NOT DETECTED Final   Enterobacteriaceae species NOT DETECTED NOT DETECTED Final   Enterobacter cloacae complex NOT DETECTED NOT DETECTED Final   Escherichia coli NOT DETECTED NOT DETECTED Final   Klebsiella oxytoca NOT DETECTED NOT DETECTED Final   Klebsiella pneumoniae NOT DETECTED NOT DETECTED Final   Proteus species NOT DETECTED NOT DETECTED Final   Serratia marcescens NOT DETECTED NOT DETECTED Final   Haemophilus influenzae NOT DETECTED NOT DETECTED Final   Neisseria meningitidis NOT DETECTED NOT DETECTED Final   Pseudomonas aeruginosa NOT DETECTED NOT DETECTED Final  Candida albicans NOT DETECTED NOT DETECTED Final   Candida glabrata NOT DETECTED NOT DETECTED Final   Candida krusei NOT DETECTED NOT DETECTED Final   Candida parapsilosis NOT DETECTED NOT DETECTED Final   Candida tropicalis NOT DETECTED NOT DETECTED Final    Comment: Performed at [facility], [address], [place], [place] [address]  Urine culture     Status: Abnormal   Collection Time: 10/31/19  7:55 PM   Specimen: In/Out Cath Urine  Result Value Ref Range Status   Specimen Description   Final    IN/OUT CATH URINE Performed at [facility], [address], [place], [place] [address]    Special Requests   Final    NONE Performed at [facility], [address] [place], [place] [address]    Culture (A)  Final    30,000 COLONIES/mL MULTIPLE SPECIES PRESENT, SUGGEST RECOLLECTION   Report Status 11/02/2019 FINAL  Final  Aerobic/Anaerobic Culture (surgical/deep wound)     Status: Abnormal (Preliminary result)   Collection Time: 11/01/19  3:23 PM   Specimen: Abdomen; Abscess  Result Value Ref Range Status   Specimen Description   Final     ABDOMEN Performed at [facility], [address] [place], [place] [address]    Special Requests   Final    Normal Performed at [facility], [address] [place], [place] [address]    Gram Stain   Final    ABUNDANT WBC PRESENT, PREDOMINANTLY PMN MODERATE GRAM POSITIVE COCCI IN PAIRS IN CLUSTERS RARE GRAM POSITIVE RODS Performed at [facility], [address], [place], [place] [address]    Culture MULTIPLE ORGANISMS PRESENT, NONE PREDOMINANT (A)  Final   Report Status PENDING  Incomplete  Culture, blood (Routine X 2) w Reflex to ID Panel     Status: None (Preliminary result)   Collection Time: 11/02/19  1:11 AM   Specimen: BLOOD  Result Value Ref Range Status   Specimen Description BLOOD BLOOD RIGHT HAND  Final   Special Requests   Final    BOTTLES DRAWN AEROBIC AND ANAEROBIC Blood Culture adequate volume   Culture   Final    NO GROWTH 4 DAYS Performed at [facility], [address] [place], [place] [address]    Report Status PENDING  Incomplete  Culture, blood (Routine X 2) w Reflex to ID Panel     Status: None (Preliminary result)   Collection Time: 11/02/19  1:17 AM   Specimen: BLOOD  Result Value Ref Range Status   Specimen Description BLOOD BLOOD LEFT FOREARM  Final   Special Requests   Final    BOTTLES DRAWN AEROBIC AND ANAEROBIC Blood Culture adequate volume   Culture   Final    NO GROWTH 4 DAYS Performed at [facility], [address], [place], [place] [address]    Report Status PENDING  Incomplete  MRSA PCR Screening     Status: None   Collection Time: 11/02/19 12:53 PM   Specimen: Nasopharyngeal  Result Value Ref Range Status   MRSA by PCR NEGATIVE NEGATIVE Final    Comment:        The GeneXpert MRSA Assay (FDA approved for NASAL specimens only), is one component of a comprehensive MRSA colonization surveillance program. It is not intended to diagnose MRSA infection nor to guide or monitor treatment  for MRSA infections. Performed at [facility], [address], [place], [place] [address]  Radiology Studies: CT ABDOMEN W CONTRAST  Result Date: 11/05/2019 CLINICAL DATA:  Right upper quadrant pain. Elevated white blood cell count. Fever. Re-evaluation of perforated cholecystitis, status post cholecystostomy. Cholecystostomy 4 days ago. EXAM: CT ABDOMEN WITH CONTRAST TECHNIQUE: Multidetector CT imaging of the abdomen was performed using the standard protocol following bolus administration of intravenous contrast. CONTRAST:  OMNIPAQUE IOHEXOL 300 MG/ML  SOLN COMPARISON:  10/31/2019 FINDINGS: Lower chest: Clear lung bases. Mild cardiomegaly with multivessel coronary artery atherosclerosis. Trace right greater than left pleural effusions are new since the prior CT. Hepatobiliary: No focal liver lesion. Interval cholecystostomy. Redemonstration of gallbladder distension she is minimally improved. At least 1 gallstone. Ill definition is of portions of the gallbladder wall, consistent with perforated cholecystitis, as before. Extension of gas and fluid in the segment 4 of the liver, including at 4.6 x 4.0 cm on 23/2. Compare 4.2 x 3.6 cm at the same level on the prior exam (when remeasured). Medial extension of presumed extraluminal fluid including on coronal image 41, similar. Residual pericholecystic edema, including inferiorly on 40/2. No intrahepatic biliary duct dilatation. Common duct is suboptimally evaluated along its pericholecystic course. No calcified common duct stone. Pancreas: Normal pancreas for age, without duct dilatation or acute inflammation. Spleen: Normal in size, without focal abnormality. Adrenals/Urinary Tract: Normal adrenal glands. Bilateral small low-density renal lesions are likely cysts. No hydronephrosis. Stomach/Bowel: Normal stomach, without wall thickening. Scattered colonic diverticula. Normal terminal ileum and appendix. Normal abdominal small  bowel. Vascular/Lymphatic: Aortic atherosclerosis. Patent portal hepatic, splenic veins. No retroperitoneal or retrocrural adenopathy. Other: No ascites.  No free intraperitoneal air. Musculoskeletal: Lower thoracic and upper lumbar spondylosis IMPRESSION: 1. Status post cholecystostomy with persistent findings of perforated cholecystitis. Slight increase in developing pericholecystic hepatic abscess or abscesses. 2. No biliary duct dilatation or convincing evidence of choledocholithiasis. 3. New tiny bilateral pleural effusions. 4.  Aortic Atherosclerosis (ICD10-I70.0). Electronically Signed   By: Jeronimo Greaves M.D.   On: 11/05/2019 15:04   CT IMAGE GUIDED DRAINAGE BY PERCUTANEOUS CATHETER  Result Date: 11/06/2019 INDICATION: Poorly draining percutaneous cholecystostomy tube. EXAM: SCRATCH CT GUIDED DRAINAGE OF GALLBLADDER MEDICATIONS: The patient is currently admitted to the hospital and receiving intravenous antibiotics. The antibiotics were administered within an appropriate time frame prior to the initiation of the procedure. ANESTHESIA/SEDATION: None. COMPLICATIONS: None immediate. TECHNIQUE: After discussing the risks and benefits of this procedure with the patient informed consent was obtained. Right upper quadrant was sterilely prepped and draped. Following local anesthesia 1% lidocaine the existing small percutaneous cholecystostomy tube was exchanged over an Amplatz extra stiff wire for a 12 French drainage catheter. 150 cc of thick pus removed. Catheter was sutured in place. There no complications. PROCEDURE: As above. FINDINGS: Successful up sizing of existing percutaneous cholecystostomy tube to a 12 French drainage tube. 150 cc of thick pus removed. No complications. Patient remained stable condition following the procedure. IMPRESSION: Successful up sizing of existing percutaneous cholecystostomy tube to a 12 French drainage tube. 150 cc of thick pus removed. Electronically Signed   By: Maisie Fus   Register   On: 11/06/2019 08:53        Scheduled Meds: . amLODipine  10 mg Oral Daily  . aspirin EC  81 mg Oral Daily  . atorvastatin  40 mg Oral q1800  . insulin aspart  0-15 Units Subcutaneous TID WC  . insulin detemir  10 Units Subcutaneous QHS  . multivitamin with minerals  1 tablet Oral Daily  . polyethylene glycol  17 g Oral  Daily  . primidone  50 mg Oral QID  . Ensure Max Protein  11 oz Oral BID  . senna-docusate  1 tablet Oral BID  . sodium chloride flush  5 mL Intracatheter Q8H   Continuous Infusions: . sodium chloride Stopped (11/05/19 2337)  . ampicillin-sulbactam (UNASYN) IV 3 g (11/06/19 0503)  . lactated ringers 100 mL/hr at 11/06/19 1239     LOS: 6 days    Time spent: 35 minutes    Tresa Moore, MD [facility] Pager [phone]  If 7PM-7AM, please contact night-coverage 11/06/2019, 12:53 PM

## 2019-11-06 NOTE — Progress Notes (Signed)
Patient clinically stable post upsize (12 FR) drain placement per Dr Register, tolerated well without sedation, only local anesthetic, tan purulent drg off with drain placement. Denies complaints at this time. Report given to TIA RN on 2C with questions answered.

## 2019-11-07 DIAGNOSIS — L0291 Cutaneous abscess, unspecified: Secondary | ICD-10-CM | POA: Diagnosis not present

## 2019-11-07 DIAGNOSIS — R7881 Bacteremia: Secondary | ICD-10-CM | POA: Diagnosis not present

## 2019-11-07 DIAGNOSIS — B954 Other streptococcus as the cause of diseases classified elsewhere: Secondary | ICD-10-CM | POA: Diagnosis not present

## 2019-11-07 DIAGNOSIS — K812 Acute cholecystitis with chronic cholecystitis: Secondary | ICD-10-CM | POA: Diagnosis not present

## 2019-11-07 DIAGNOSIS — K8012 Calculus of gallbladder with acute and chronic cholecystitis without obstruction: Secondary | ICD-10-CM

## 2019-11-07 DIAGNOSIS — B952 Enterococcus as the cause of diseases classified elsewhere: Secondary | ICD-10-CM | POA: Diagnosis not present

## 2019-11-07 DIAGNOSIS — Z9049 Acquired absence of other specified parts of digestive tract: Secondary | ICD-10-CM

## 2019-11-07 LAB — BASIC METABOLIC PANEL
Anion gap: 8 (ref 5–15)
BUN: 11 mg/dL (ref 8–23)
CO2: 30 mmol/L (ref 22–32)
Calcium: 8.5 mg/dL — ABNORMAL LOW (ref 8.9–10.3)
Chloride: 100 mmol/L (ref 98–111)
Creatinine, Ser: 0.91 mg/dL (ref 0.61–1.24)
GFR calc Af Amer: >60 mL/min (ref >60)
GFR calc non Af Amer: >60 mL/min (ref >60)
Glucose, Bld: 125 mg/dL — ABNORMAL HIGH (ref 70–99)
Potassium: 4.1 mmol/L (ref 3.5–5.1)
Sodium: 138 mmol/L (ref 135–145)

## 2019-11-07 LAB — CBC WITH DIFFERENTIAL/PLATELET
Abs Immature Granulocytes: 0.25 10*3/uL — ABNORMAL HIGH (ref 0.00–0.07)
Basophils Absolute: 0.1 10*3/uL (ref 0.0–0.1)
Basophils Relative: 0 %
Eosinophils Absolute: 0.2 10*3/uL (ref 0.0–0.5)
Eosinophils Relative: 1 %
HCT: 29.4 % — ABNORMAL LOW (ref 39.0–52.0)
Hemoglobin: 9.8 g/dL — ABNORMAL LOW (ref 13.0–17.0)
Immature Granulocytes: 2 %
Lymphocytes Relative: 8 %
Lymphs Abs: 1 10*3/uL (ref 0.7–4.0)
MCH: 29 pg (ref 26.0–34.0)
MCHC: 33.3 g/dL (ref 30.0–36.0)
MCV: 87 fL (ref 80.0–100.0)
Monocytes Absolute: 0.9 10*3/uL (ref 0.1–1.0)
Monocytes Relative: 7 %
Neutro Abs: 9.7 10*3/uL — ABNORMAL HIGH (ref 1.7–7.7)
Neutrophils Relative %: 82 %
Platelets: 363 10*3/uL (ref 150–400)
RBC: 3.38 MIL/uL — ABNORMAL LOW (ref 4.22–5.81)
RDW: 13.4 % (ref 11.5–15.5)
WBC: 12 10*3/uL — ABNORMAL HIGH (ref 4.0–10.5)
nRBC: 0 % (ref 0.0–0.2)

## 2019-11-07 LAB — CULTURE, BLOOD (ROUTINE X 2)
Culture: NO GROWTH
Culture: NO GROWTH
Special Requests: ADEQUATE
Special Requests: ADEQUATE

## 2019-11-07 LAB — GLUCOSE, CAPILLARY
Glucose-Capillary: 132 mg/dL — ABNORMAL HIGH (ref 70–99)
Glucose-Capillary: 167 mg/dL — ABNORMAL HIGH (ref 70–99)
Glucose-Capillary: 162 mg/dL — ABNORMAL HIGH (ref 70–99)
Glucose-Capillary: 161 mg/dL — ABNORMAL HIGH (ref 70–99)

## 2019-11-07 NOTE — Progress Notes (Signed)
PT Cancellation Note  Patient Details Name: Kyle Ritter MRN: [id number] DOB: 04-27-1946   Cancelled Treatment:    Reason Eval/Treat Not Completed: Other (comment)(Pt reported he has been up and walking today. Visitor arrived while speaking to pt, pt wanted to sit and spend time with visitor. PT to follow up as able.)   Olga Coaster PT, DPT 3:45 PM,11/07/19

## 2019-11-07 NOTE — Progress Notes (Signed)
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 Work-up in the ED reveals fever, leukocytosis and CT shows acute on chronic cholecystitis with massively enlarged gallbladder and possible hepatic abscess. General surgery was consulted and recommended IR drainage of abscess with percutaneous cholecystotomy.     Consultants:  IR, ID, general surgery  Procedures: Status post pericholecystic hepatic abscess drainage  Antimicrobials:  Unasyn   Subjective: Reports no abdominal pain, chills, nausea vomiting.  Tolerated diet so far  Objective: Vitals:   11/06/19 1412 11/06/19 1939 11/07/19 0458 11/07/19 1336  BP: (!) 146/68 (!) 150/70 (!) 167/70 (!) 146/69  Pulse: 70 70 62 67  Resp: 18 19 17 18   Temp: 98.3 F (36.8 C) 98.3 F (36.8 C) 98.3 F (36.8 C) 98.3 F (36.8 C)  TempSrc: Oral  Oral Oral  SpO2: 98% 98% 95% 98%  Weight:      Height:        Intake/Output Summary (Last 24 hours) at 11/07/2019 1539 Last data filed at 11/07/2019 1437 Gross per 24 hour  Intake 1043.56 ml  Output 1230 ml  Net -186.44 ml   Filed Weights   10/31/19 1825 11/05/19 1638  Weight: 82.5 kg 80.8 kg    Examination:  General exam: Appears calm and comfortable  Respiratory system: Clear to auscultation. Respiratory effort normal. Cardiovascular system: S1 & S2 heard, RRR. No JVD, murmurs, rubs, gallops or clicks.  Gastrointestinal system: Abdomen is nondistended, soft and nontender.  Normal bowel sounds heard. Central nervous system: Alert and oriented. No focal neurological deficits. Extremities: no edema Skin: warm, dry Psychiatry: Judgement and insight appear normal. Mood & affect appropriate.     Data Reviewed: I have personally reviewed following labs and imaging  studies  CBC: Recent Labs  Lab 10/31/19 1830 11/01/19 0953 11/02/19 0940 11/03/19 0717 11/07/19 0558  WBC 14.5* 16.3* 14.3* 13.4* 12.0*  NEUTROABS 12.5*  --   --   --  9.7*  HGB 10.3* 10.1* 10.0* 9.8* 9.8*  HCT 30.9* 30.4* 31.0* 29.1* 29.4*  MCV 86.3 87.9 89.3 86.6 87.0  PLT 297 270 297 308 363   Basic Metabolic Panel: Recent Labs  Lab 10/31/19 1830 11/01/19 0953 11/02/19 0940 11/03/19 0717 11/07/19 0558  NA 126* 137 135 136 138  K 4.7 4.2 3.6 3.3* 4.1  CL 91* 101 101 103 100  CO2 24 26 26 26 30   GLUCOSE 509* 355* 136* 149* 125*  BUN 28* 23 18 11 11   CREATININE 1.46* 1.11 1.03 0.88 0.91  CALCIUM 9.1 9.1 8.7* 8.2* 8.5*   GFR: Estimated Creatinine Clearance: 75.9 mL/min (by C-G formula based on SCr of 0.91 mg/dL). Liver Function Tests: Recent Labs  Lab 10/31/19 1830 11/01/19 0953 11/03/19 0717  AST 123* 66* 23  ALT 146* 116* 55*  ALKPHOS 242* 222* 158*  BILITOT 1.0 0.9 0.8  PROT 7.6 7.2 6.3*  ALBUMIN 2.7* 2.5* 2.2*   Recent Labs  Lab 11/03/19 0717  LIPASE 17   No results for input(s): AMMONIA in the last 168 hours. Coagulation Profile: Recent Labs  Lab 11/01/19 0953  INR 1.4*   Cardiac Enzymes: No results for input(s): CKTOTAL, CKMB, CKMBINDEX, TROPONINI in the last 168 hours. BNP (last 3 results) No results for input(s): PROBNP in the last 8760 hours. HbA1C: No  results for input(s): HGBA1C in the last 72 hours. CBG: Recent Labs  Lab 11/06/19 1148 11/06/19 1712 11/06/19 2109 11/07/19 0800 11/07/19 1134  GLUCAP 180* 167* 176* 132* 167*   Lipid Profile: No results for input(s): CHOL, HDL, LDLCALC, TRIG, CHOLHDL, LDLDIRECT in the last 72 hours. Thyroid Function Tests: No results for input(s): TSH, T4TOTAL, FREET4, T3FREE, THYROIDAB in the last 72 hours. Anemia Panel: No results for input(s): VITAMINB12, FOLATE, FERRITIN, TIBC, IRON, RETICCTPCT in the last 72 hours. Sepsis Labs: Recent Labs  Lab 10/31/19 1830  PROCALCITON 1.82    LATICACIDVEN 1.6    Recent Results (from the past 240 hour(s))  Blood Culture (routine x 2)     Status: Abnormal   Collection Time: 10/31/19  6:30 PM   Specimen: BLOOD  Result Value Ref Range Status   Specimen Description   Final    BLOOD BLOOD RIGHT HAND Performed at [facility], [address], [place], [place] [address]    Special Requests   Final    BOTTLES DRAWN AEROBIC AND ANAEROBIC Blood Culture adequate volume Performed at [facility], [address] [place], [place] [address]    Culture  Setup Time   Final    GRAM POSITIVE COCCI IN BOTH AEROBIC AND ANAEROBIC BOTTLES CRITICAL RESULT CALLED TO, READ BACK BY AND VERIFIED WITH: SHERRIE THOMPSON @0438  11/01/19 AKT    Culture (A)  Final    STREPTOCOCCUS GALLOLYTICUS ENTEROCOCCUS FAECIUM SUSCEPTIBILITIES PERFORMED ON PREVIOUS CULTURE WITHIN THE LAST 5 DAYS. Performed at [facility], [place] [address] [place], [place] [address]    Report Status 11/04/2019 FINAL  Final  Blood Culture (routine x 2)     Status: Abnormal   Collection Time: 10/31/19  6:30 PM   Specimen: BLOOD  Result Value Ref Range Status   Specimen Description   Final    BLOOD LEFT ANTECUBITAL Performed at [facility], [address] [place], [place] [address]    Special Requests   Final    BOTTLES DRAWN AEROBIC AND ANAEROBIC Blood Culture results may not be optimal due to an inadequate volume of blood received in culture bottles Performed at [facility], [address] [place], [place] [address]    Culture  Setup Time   Final    GRAM POSITIVE COCCI IN BOTH AEROBIC AND ANAEROBIC BOTTLES CRITICAL VALUE NOTED.  VALUE IS CONSISTENT WITH PREVIOUSLY REPORTED AND CALLED VALUE. Performed at [facility], [address] [place], [place] [address]    Culture STREPTOCOCCUS GALLOLYTICUS ENTEROCOCCUS FAECIUM  (A)  Final   Report Status 11/04/2019 FINAL  Final   Organism ID, Bacteria STREPTOCOCCUS  GALLOLYTICUS  Final   Organism ID, Bacteria ENTEROCOCCUS FAECIUM  Final      Susceptibility   Enterococcus faecium - MIC*    AMPICILLIN <=2 SENSITIVE Sensitive     VANCOMYCIN <=0.5 SENSITIVE Sensitive     GENTAMICIN SYNERGY SENSITIVE Sensitive     * ENTEROCOCCUS FAECIUM   Streptococcus gallolyticus - MIC*    PENICILLIN 0.12 SENSITIVE Sensitive     CEFTRIAXONE 0.25 SENSITIVE Sensitive     ERYTHROMYCIN >=8 RESISTANT Resistant     LEVOFLOXACIN 2 SENSITIVE Sensitive     VANCOMYCIN 0.25 SENSITIVE Sensitive     * STREPTOCOCCUS GALLOLYTICUS  Blood Culture ID Panel (Reflexed)     Status: Abnormal   Collection Time: 10/31/19  6:30 PM  Result Value Ref Range Status   Enterococcus species DETECTED (A) NOT DETECTED Final  Comment: CRITICAL RESULT CALLED TO, READ BACK BY AND VERIFIED WITH: sherrie thompson @0438  11/01/19 akt    Vancomycin resistance NOT DETECTED NOT DETECTED Final   Listeria monocytogenes NOT DETECTED NOT DETECTED Final   Staphylococcus species NOT DETECTED NOT DETECTED Final   Staphylococcus aureus (BCID) NOT DETECTED NOT DETECTED Final   Streptococcus species DETECTED (A) NOT DETECTED Final    Comment: Not Enterococcus species, Streptococcus agalactiae, Streptococcus pyogenes, or Streptococcus pneumoniae. CRITICAL RESULT CALLED TO, READ BACK BY AND VERIFIED WITH: SHERRIE THOMPSON @0438  11/01/19 AKT    Streptococcus agalactiae NOT DETECTED NOT DETECTED Final   Streptococcus pneumoniae NOT DETECTED NOT DETECTED Final   Streptococcus pyogenes NOT DETECTED NOT DETECTED Final   Acinetobacter baumannii NOT DETECTED NOT DETECTED Final   Enterobacteriaceae species NOT DETECTED NOT DETECTED Final   Enterobacter cloacae complex NOT DETECTED NOT DETECTED Final   Escherichia coli NOT DETECTED NOT DETECTED Final   Klebsiella oxytoca NOT DETECTED NOT DETECTED Final   Klebsiella pneumoniae NOT DETECTED NOT DETECTED Final   Proteus species NOT DETECTED NOT DETECTED Final   Serratia  marcescens NOT DETECTED NOT DETECTED Final   Haemophilus influenzae NOT DETECTED NOT DETECTED Final   Neisseria meningitidis NOT DETECTED NOT DETECTED Final   Pseudomonas aeruginosa NOT DETECTED NOT DETECTED Final   Candida albicans NOT DETECTED NOT DETECTED Final   Candida glabrata NOT DETECTED NOT DETECTED Final   Candida krusei NOT DETECTED NOT DETECTED Final   Candida parapsilosis NOT DETECTED NOT DETECTED Final   Candida tropicalis NOT DETECTED NOT DETECTED Final    Comment: Performed at [facility], [address], [place], [address] [place] [address] [place]  Urine culture     Status: Abnormal   Collection Time: 10/31/19  7:55 PM   Specimen: In/Out Cath Urine  Result Value Ref Range Status   Specimen Description   Final    IN/OUT CATH URINE Performed at [facility], [address], [place], [address] [place] [address] [place]    Special Requests   Final    NONE Performed at [facility], [address] [place], [address] [place]    Culture (A)  Final    30,000 COLONIES/mL MULTIPLE SPECIES PRESENT, SUGGEST RECOLLECTION   Report Status 11/02/2019 FINAL  Final  Aerobic/Anaerobic Culture (surgical/deep wound)     Status: None (Preliminary result)   Collection Time: 11/01/19  3:23 PM   Specimen: Abdomen; Abscess  Result Value Ref Range Status   Specimen Description   Final    ABDOMEN Performed at [facility], [address] [place], [address] [place] [address] [place]    Special Requests   Final    Normal Performed at [facility], [address] [place], [address] [place]    Gram Stain   Final    ABUNDANT WBC PRESENT, PREDOMINANTLY PMN MODERATE GRAM POSITIVE COCCI IN PAIRS IN CLUSTERS RARE GRAM POSITIVE RODS    Culture   Final    CULTURE REINCUBATED FOR BETTER GROWTH Performed at [facility], [address], [place], [address] [place]    Report Status PENDING  Incomplete  Culture, blood (Routine X 2) w Reflex to ID Panel     Status: None   Collection Time:  11/02/19  1:11 AM   Specimen: BLOOD  Result Value Ref Range Status   Specimen Description BLOOD BLOOD RIGHT HAND  Final   Special Requests   Final    BOTTLES DRAWN AEROBIC AND ANAEROBIC Blood Culture adequate volume   Culture   Final  NO GROWTH 5 DAYS Performed at [facility], [address] [place], [place] [address]    Report Status 11/07/2019 FINAL  Final  Culture, blood (Routine X 2) w Reflex to ID Panel     Status: None   Collection Time: 11/02/19  1:17 AM   Specimen: BLOOD  Result Value Ref Range Status   Specimen Description BLOOD BLOOD LEFT FOREARM  Final   Special Requests   Final    BOTTLES DRAWN AEROBIC AND ANAEROBIC Blood Culture adequate volume   Culture   Final    NO GROWTH 5 DAYS Performed at [facility], [address] [place], [place] [address]    Report Status 11/07/2019 FINAL  Final  MRSA PCR Screening     Status: None   Collection Time: 11/02/19 12:53 PM   Specimen: Nasopharyngeal  Result Value Ref Range Status   MRSA by PCR NEGATIVE NEGATIVE Final    Comment:        The GeneXpert MRSA Assay (FDA approved for NASAL specimens only), is one component of a comprehensive MRSA colonization surveillance program. It is not intended to diagnose MRSA infection nor to guide or monitor treatment for MRSA infections. Performed at [facility], [address] [place], [place] [address]          Radiology Studies: CT IMAGE GUIDED DRAINAGE BY PERCUTANEOUS CATHETER  Result Date: 11/06/2019 INDICATION: Poorly draining percutaneous cholecystostomy tube. EXAM: SCRATCH CT GUIDED DRAINAGE OF GALLBLADDER MEDICATIONS: The patient is currently admitted to the hospital and receiving intravenous antibiotics. The antibiotics were administered within an appropriate time frame prior to the initiation of the procedure. ANESTHESIA/SEDATION: None. COMPLICATIONS: None immediate. TECHNIQUE: After discussing the risks and benefits of this  procedure with the patient informed consent was obtained. Right upper quadrant was sterilely prepped and draped. Following local anesthesia 1% lidocaine the existing small percutaneous cholecystostomy tube was exchanged over an Amplatz extra stiff wire for a 12 French drainage catheter. 150 cc of thick pus removed. Catheter was sutured in place. There no complications. PROCEDURE: As above. FINDINGS: Successful up sizing of existing percutaneous cholecystostomy tube to a 12 French drainage tube. 150 cc of thick pus removed. No complications. Patient remained stable condition following the procedure. IMPRESSION: Successful up sizing of existing percutaneous cholecystostomy tube to a 12 French drainage tube. 150 cc of thick pus removed. Electronically Signed   By: Maisie Fus  Register   On: 11/06/2019 08:53        Scheduled Meds: . amLODipine  10 mg Oral Daily  . aspirin EC  81 mg Oral Daily  . atorvastatin  40 mg Oral q1800  . heparin injection (subcutaneous)  5,000 Units Subcutaneous Q8H  . insulin aspart  0-15 Units Subcutaneous TID WC  . insulin detemir  10 Units Subcutaneous QHS  . multivitamin with minerals  1 tablet Oral Daily  . polyethylene glycol  17 g Oral Daily  . primidone  50 mg Oral QID  . Ensure Max Protein  11 oz Oral BID  . senna-docusate  1 tablet Oral BID  . sodium chloride flush  5 mL Intracatheter Q8H   Continuous Infusions: . sodium chloride Stopped (11/06/19 0503)  . ampicillin-sulbactam (UNASYN) IV 3 g (11/07/19 1235)  . lactated ringers Stopped (11/07/19 0519)    Assessment & Plan:   Active Problems:   Acute on chronic cholecystitis   Gallbladder abscess s/p cholecystotomy and they drained 450 cc blood-tinged pus Cultures with Enterococcus and Streptococcus  continue on Unasyn  for enterococcal/streptococcal bacteremia. Repeat CAT scan demonstrating enlarging pericholecystic hepatic abscess Interventional radiology reconsulted Status post drain exchange on  11/06/2019 Continue to follow drain output ID following-recommend Will need repeat imaging tomorrow ( ultrasound may be adequate) to make sure that there is improvement /resolution Continue Unasyn and will need for minimum 4 weeks  Sepsis secondary to enterococcal and streptococcal bacteremia Blood culture on admission positive for Enterococcus and Streptococcus Patient remains on Unasyn, from minimum 4 weeks Surveillance blood cultures have been negative Anticipate patient will require PICC line placement.  Will need to ensure source control.    DM2 SSI 0 to 15 units AC and at bedtime Metformin being held  AKI Creatinine much improved with treatment of infection and hydration  Hyponatremia Sodium normalized with hydration.  DVT prophylaxis: LMWH Code Status: FULL Family Communication:  None at bedside  disposition Plan: Anticipate home with home health.  Patient remains on IV antibiotics at this time.  Anticipate he will need PICC line for longer term IV antibiotics.  Cholecystostomy drain placed, then upsized on 11/06/2019.  Needs repeat imaging in a.m.        LOS: 7 days    Time spent: 45 min with >50% coc    Lynn ItoSahar Anika Shore, MD [facility] Pager [phone]  If 7PM-7AM, please contact night-coverage [email or web address] Password [email or web address] 11/07/2019, 3:39 PM

## 2019-11-07 NOTE — Progress Notes (Signed)
ID Yesterday IR upsized the tube and drained 150cc of pus  Feeling better Says appetiie improving Moving with walker  Patient Vitals for the past 24 hrs:  BP Temp Temp src Pulse Resp SpO2  11/07/19 0458 (!) 167/70 98.3 F (36.8 C) Oral 62 17 95 %  11/06/19 1939 (!) 150/70 98.3 F (36.8 C) -- 70 19 98 %  11/06/19 1412 (!) 146/68 98.3 F (36.8 C) Oral 70 18 98 %   Awake and alert Chronically ill Chest b/l air entry [id number] Abd Rt upper quadrant draine  CNS non focal  Labs CBC Latest Ref Rng & Units 11/07/2019 11/03/2019 11/02/2019  WBC 4.0 - 10.5 K/uL 12.0(H) 13.4(H) 14.3(H)  Hemoglobin 13.0 - 17.0 g/dL 3.1(R) 9.4(V) 10.0(L)  Hematocrit 39.0 - 52.0 % 29.4(L) 29.1(L) 31.0(L)  Platelets 150 - 400 K/uL 363 308 297    CMP Latest Ref Rng & Units 11/07/2019 11/03/2019 11/02/2019  Glucose 70 - 99 mg/dL 859(Y) 924(M) 628(M)  BUN 8 - 23 mg/dL 11 11 18   Creatinine 0.61 - 1.24 mg/dL 3.81 7.71  Sodium 135 - 145 mmol/L 138 136 135  Potassium 3.5 - 5.1 mmol/L 4.1 3.3(L) 3.6  Chloride 98 - 111 mmol/L 100 103 101  CO2 22 - 32 mmol/L 30 26 26   Calcium 8.9 - 10.3 mg/dL 1.65) ) 7.9(U)  Total Protein 6.5 - 8.1 g/dL - 6.3(L) -  Total Bilirubin 0.3 - 1.2 mg/dL - 0.8 -  Alkaline Phos 38 - 126 U/L - 158(H) -  AST 15 - 41 U/L - 23 -  ALT 0 - 44 U/L - 55(H) -    Micro 4/7 blood culture streptococcus gallolyticus and enterococcus Bile culture - lab identifying the organisms individually- they had reported it as multiple organisms seen  Impression/recommednation  Polymicrobial bacteremia from biliary source- doubt endocarditis 2 d echo okay  Acute on chronic cholecystitis, gall bladder abscess, perforation due to gall stone , liver /pericholic abscess. Had cholecystomy on 4/8 and  drain upgraded on 4/13 due to No improvement in the abscess. Another 150cc of pus drained Will need repeat imaging tomorrow ( ultrasound may be adequate) to make sure that there is improvement /resolution Pt  is on Unasyn and will need for minimum 4 weeks Discussed the management with the patient

## 2019-11-07 NOTE — TOC Progression Note (Addendum)
Transition of Care [facility]) - Progression Note    Patient Details  Name: KOLTEN RYBACK MRN: [id number] Date of Birth: January 11, 1946  [facility] CM/SW Contact  Margarito Liner, LCSW Phone Number: 11/07/2019, 9:35 AM  Clinical Narrative: Patient said he still does not know which home health agency his sister wants. CSW was unable to meet with her when she arrived yesterday. Patient gave CSW permission to call her. No answer, no voicemail available. Will try again later.    1:15 pm: Notified Pam Chander with [facility] that patient may need home IV abx.  4:30 pm: [facility] is not in network with [facility] yet. Pam will send referral to [facility]. CSW spoke with sister. No home health agency preference. CSW calling around to see who is in network and can manage his needs.  Expected Discharge Plan: Home w Home Health Services Barriers to Discharge: Continued Medical Work up  Expected Discharge Plan and Services Expected Discharge Plan: Home w Home Health Services     Post Acute Care Choice: Home Health Living arrangements for the past 2 months: Mobile Home                                       Social Determinants of Health (SDOH) Interventions    Readmission Risk Interventions No flowsheet data found.

## 2019-11-08 ENCOUNTER — Encounter: Payer: Self-pay | Admitting: Internal Medicine

## 2019-11-08 ENCOUNTER — Inpatient Hospital Stay: Payer: Medicare Other

## 2019-11-08 ENCOUNTER — Inpatient Hospital Stay: Payer: Self-pay

## 2019-11-08 DIAGNOSIS — K812 Acute cholecystitis with chronic cholecystitis: Secondary | ICD-10-CM | POA: Diagnosis not present

## 2019-11-08 DIAGNOSIS — N179 Acute kidney failure, unspecified: Secondary | ICD-10-CM | POA: Diagnosis not present

## 2019-11-08 DIAGNOSIS — L0291 Cutaneous abscess, unspecified: Secondary | ICD-10-CM | POA: Diagnosis not present

## 2019-11-08 DIAGNOSIS — R7881 Bacteremia: Secondary | ICD-10-CM | POA: Diagnosis not present

## 2019-11-08 LAB — CBC WITH DIFFERENTIAL/PLATELET
Abs Immature Granulocytes: 0.23 10*3/uL — ABNORMAL HIGH (ref 0.00–0.07)
Basophils Absolute: 0.1 10*3/uL (ref 0.0–0.1)
Basophils Relative: 1 %
Eosinophils Absolute: 0.2 10*3/uL (ref 0.0–0.5)
Eosinophils Relative: 2 %
HCT: 30.3 % — ABNORMAL LOW (ref 39.0–52.0)
Hemoglobin: 10 g/dL — ABNORMAL LOW (ref 13.0–17.0)
Immature Granulocytes: 2 %
Lymphocytes Relative: 14 %
Lymphs Abs: 1.4 10*3/uL (ref 0.7–4.0)
MCH: 28.7 pg (ref 26.0–34.0)
MCHC: 33 g/dL (ref 30.0–36.0)
MCV: 87.1 fL (ref 80.0–100.0)
Monocytes Absolute: 0.7 10*3/uL (ref 0.1–1.0)
Monocytes Relative: 7 %
Neutro Abs: 7.4 10*3/uL (ref 1.7–7.7)
Neutrophils Relative %: 74 %
Platelets: 354 10*3/uL (ref 150–400)
RBC: 3.48 MIL/uL — ABNORMAL LOW (ref 4.22–5.81)
RDW: 13.4 % (ref 11.5–15.5)
WBC: 10 10*3/uL (ref 4.0–10.5)
nRBC: 0 % (ref 0.0–0.2)

## 2019-11-08 LAB — BASIC METABOLIC PANEL
Anion gap: 8 (ref 5–15)
BUN: 12 mg/dL (ref 8–23)
CO2: 28 mmol/L (ref 22–32)
Calcium: 8.9 mg/dL (ref 8.9–10.3)
Chloride: 102 mmol/L (ref 98–111)
Creatinine, Ser: 0.92 mg/dL (ref 0.61–1.24)
GFR calc Af Amer: >60 mL/min (ref >60)
GFR calc non Af Amer: >60 mL/min (ref >60)
Glucose, Bld: 151 mg/dL — ABNORMAL HIGH (ref 70–99)
Potassium: 3.6 mmol/L (ref 3.5–5.1)
Sodium: 138 mmol/L (ref 135–145)

## 2019-11-08 LAB — GLUCOSE, CAPILLARY
Glucose-Capillary: 116 mg/dL — ABNORMAL HIGH (ref 70–99)
Glucose-Capillary: 146 mg/dL — ABNORMAL HIGH (ref 70–99)
Glucose-Capillary: 184 mg/dL — ABNORMAL HIGH (ref 70–99)
Glucose-Capillary: 130 mg/dL — ABNORMAL HIGH (ref 70–99)

## 2019-11-08 MED ORDER — SODIUM CHLORIDE 0.9% FLUSH
10.0000 mL | INTRAVENOUS | Status: DC | PRN
  Administered 2019-11-09: 10 mL

## 2019-11-08 MED ORDER — IOHEXOL 300 MG/ML  SOLN
100.0000 mL | Freq: Once | INTRAMUSCULAR | Status: AC | PRN
  Administered 2019-11-08: 100 mL via INTRAVENOUS

## 2019-11-08 MED ORDER — ENSURE ENLIVE PO LIQD
237.0000 mL | Freq: Three times a day (TID) | ORAL | Status: DC
  Administered 2019-11-08 – 2019-11-10 (×8): 237 mL via ORAL

## 2019-11-08 MED ORDER — SODIUM CHLORIDE 0.9% FLUSH
10.0000 mL | Freq: Two times a day (BID) | INTRAVENOUS | Status: DC
  Administered 2019-11-08 – 2019-11-10 (×4): 10 mL

## 2019-11-08 MED ORDER — CHLORHEXIDINE GLUCONATE CLOTH 2 % EX PADS
6.0000 | MEDICATED_PAD | Freq: Every day | CUTANEOUS | Status: DC
  Administered 2019-11-09 – 2019-11-10 (×2): 6 via TOPICAL

## 2019-11-08 NOTE — Care Management Important Message (Signed)
Important Message  Patient Details  Name: Kyle Ritter MRN: [id number] Date of Birth: [birth year]   Medicare Important Message Given:  Yes     [name] 11/08/2019, 2:10 PM

## 2019-11-08 NOTE — Progress Notes (Signed)
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 Work-up in the ED reveals fever, leukocytosis and CT shows acute on chronic cholecystitis with massively enlarged gallbladder and possible hepatic abscess. General surgery was consulted and recommended IR drainage of abscess with percutaneous cholecystotomy.     Consultants:  IR, ID, general surgery  Procedures: Status post pericholecystic hepatic abscess drainage  Antimicrobials:  Unasyn   Subjective: Has no complaints today  Objective: Vitals:   11/07/19 0458 11/07/19 1336 11/07/19 1948 11/08/19 0525  BP: (!) 167/70 (!) 146/69 (!) 152/74 (!) 166/77  Pulse: 62 67 66 63  Resp: 17 18 18 20   Temp: 98.3 F (36.8 C) 98.3 F (36.8 C) 98.1 F (36.7 C) 98 F (36.7 C)  TempSrc: Oral Oral Oral Oral  SpO2: 95% 98% 100% 95%  Weight:      Height:        Intake/Output Summary (Last 24 hours) at 11/08/2019 0747 Last data filed at 11/08/2019 0630 Gross per 24 hour  Intake 1927.28 ml  Output 1930 ml  Net -2.72 ml   Filed Weights   10/31/19 1825 11/05/19 1638  Weight: 82.5 kg 80.8 kg    Examination:  General exam: Appears calm and comfortable ,and Respiratory system: Clear to auscultation. Respiratory effort normal.  No wheezing Cardiovascular system: S1 & S2 heard, RRR. No JVD, murmurs, rubs, gallops or clicks.  Gastrointestinal system: Abdomen is nondistended, soft and nontender.  Normal bowel sounds heard.Rt drain in place draining dark liquid Central nervous system: Alert and oriented.  Grossly intact Extremities: no edema Skin: warm, dry Psychiatry: Judgement and insight appear normal. Mood & affect appropriate.     Data Reviewed: I have personally reviewed following labs and imaging  studies  CBC: Recent Labs  Lab 11/01/19 0953 11/02/19 0940 11/03/19 0717 11/07/19 0558 11/08/19 0428  WBC 16.3* 14.3* 13.4* 12.0* 10.0  NEUTROABS  --   --   --  9.7* 7.4  HGB 10.1* 10.0* 9.8* 9.8* 10.0*  HCT 30.4* 31.0* 29.1* 29.4* 30.3*  MCV 87.9 89.3 86.6 87.0 87.1  PLT 270 297 308 363 354   Basic Metabolic Panel: Recent Labs  Lab 11/01/19 0953 11/02/19 0940 11/03/19 0717 11/07/19 0558 11/08/19 0428  NA 137 135 136 138 138  K 4.2 3.6 3.3* 4.1 3.6  CL 101 101 103 100 102  CO2 26 26 26 30 28   GLUCOSE 355* 136* 149* 125* 151*  BUN 23 18 11 11 12   CREATININE 1.11 1.03 0.88 0.91 0.92  CALCIUM 9.1 8.7* 8.2* 8.5* 8.9   GFR: Estimated Creatinine Clearance: 75.1 mL/min (by C-G formula based on SCr of 0.92 mg/dL). Liver Function Tests: Recent Labs  Lab 11/01/19 0953 11/03/19 0717  AST 66* 23  ALT 116* 55*  ALKPHOS 222* 158*  BILITOT 0.9 0.8  PROT 7.2 6.3*  ALBUMIN 2.5* 2.2*   Recent Labs  Lab 11/03/19 0717  LIPASE 17   No results for input(s): AMMONIA in the last 168 hours. Coagulation Profile: Recent Labs  Lab 11/01/19 0953  INR 1.4*   Cardiac Enzymes: No results for input(s): CKTOTAL, CKMB, CKMBINDEX, TROPONINI in the last 168 hours. BNP (last 3 results) No results for input(s): PROBNP in the last 8760 hours. HbA1C: No results for input(s): HGBA1C in the last 72  hours. CBG: Recent Labs  Lab 11/06/19 2109 11/07/19 0800 11/07/19 1134 11/07/19 1639 11/07/19 2150  GLUCAP 176* 132* 167* 162* 161*   Lipid Profile: No results for input(s): CHOL, HDL, LDLCALC, TRIG, CHOLHDL, LDLDIRECT in the last 72 hours. Thyroid Function Tests: No results for input(s): TSH, T4TOTAL, FREET4, T3FREE, THYROIDAB in the last 72 hours. Anemia Panel: No results for input(s): VITAMINB12, FOLATE, FERRITIN, TIBC, IRON, RETICCTPCT in the last 72 hours. Sepsis Labs: No results for input(s): PROCALCITON, LATICACIDVEN in the last 168 hours.  Recent Results (from the past 240  hour(s))  Blood Culture (routine x 2)     Status: Abnormal   Collection Time: 10/31/19  6:30 PM   Specimen: BLOOD  Result Value Ref Range Status   Specimen Description   Final    BLOOD BLOOD RIGHT HAND Performed at [facility], [address], [place], [place] [address]    Special Requests   Final    BOTTLES DRAWN AEROBIC AND ANAEROBIC Blood Culture adequate volume Performed at [facility], [address] [place], [place] [address]    Culture  Setup Time   Final    GRAM POSITIVE COCCI IN BOTH AEROBIC AND ANAEROBIC BOTTLES CRITICAL RESULT CALLED TO, READ BACK BY AND VERIFIED WITH: SHERRIE THOMPSON @0438  11/01/19 AKT    Culture (A)  Final    STREPTOCOCCUS GALLOLYTICUS ENTEROCOCCUS FAECIUM SUSCEPTIBILITIES PERFORMED ON PREVIOUS CULTURE WITHIN THE LAST 5 DAYS. Performed at [facility], [address], [place], [place]    Report Status 11/04/2019 FINAL  Final  Blood Culture (routine x 2)     Status: Abnormal   Collection Time: 10/31/19  6:30 PM   Specimen: BLOOD  Result Value Ref Range Status   Specimen Description   Final    BLOOD LEFT ANTECUBITAL Performed at [facility], [address] [place], [place]    Special Requests   Final    BOTTLES DRAWN AEROBIC AND ANAEROBIC Blood Culture results may not be optimal due to an inadequate volume of blood received in culture bottles Performed at [facility], [address] [place], [place]    Culture  Setup Time   Final    GRAM POSITIVE COCCI IN BOTH AEROBIC AND ANAEROBIC BOTTLES CRITICAL VALUE NOTED.  VALUE IS CONSISTENT WITH PREVIOUSLY REPORTED AND CALLED VALUE. Performed at [facility], [address] [place], [place]    Culture STREPTOCOCCUS GALLOLYTICUS ENTEROCOCCUS FAECIUM  (A)  Final   Report Status 11/04/2019 FINAL  Final   Organism ID, Bacteria STREPTOCOCCUS GALLOLYTICUS  Final   Organism ID, Bacteria ENTEROCOCCUS  FAECIUM  Final      Susceptibility   Enterococcus faecium - MIC*    AMPICILLIN <=2 SENSITIVE Sensitive     VANCOMYCIN <=0.5 SENSITIVE Sensitive     GENTAMICIN SYNERGY SENSITIVE Sensitive     * ENTEROCOCCUS FAECIUM   Streptococcus gallolyticus - MIC*    PENICILLIN 0.12 SENSITIVE Sensitive     CEFTRIAXONE 0.25 SENSITIVE Sensitive     ERYTHROMYCIN >=8 RESISTANT Resistant     LEVOFLOXACIN 2 SENSITIVE Sensitive     VANCOMYCIN 0.25 SENSITIVE Sensitive     * STREPTOCOCCUS GALLOLYTICUS  Blood Culture ID Panel (Reflexed)     Status: Abnormal   Collection Time: 10/31/19  6:30 PM  Result Value Ref Range Status   Enterococcus species DETECTED (A) NOT DETECTED Final    Comment: CRITICAL RESULT CALLED TO, READ BACK BY AND VERIFIED WITH: sherrie  thompson @0438  11/01/19 akt    Vancomycin resistance NOT DETECTED NOT DETECTED Final   Listeria monocytogenes NOT DETECTED NOT DETECTED Final   Staphylococcus species NOT DETECTED NOT DETECTED Final   Staphylococcus aureus (BCID) NOT DETECTED NOT DETECTED Final   Streptococcus species DETECTED (A) NOT DETECTED Final    Comment: Not Enterococcus species, Streptococcus agalactiae, Streptococcus pyogenes, or Streptococcus pneumoniae. CRITICAL RESULT CALLED TO, READ BACK BY AND VERIFIED WITH: [place] @0438  11/01/19 AKT    Streptococcus agalactiae NOT DETECTED NOT DETECTED Final   Streptococcus pneumoniae NOT DETECTED NOT DETECTED Final   Streptococcus pyogenes NOT DETECTED NOT DETECTED Final   Acinetobacter baumannii NOT DETECTED NOT DETECTED Final   Enterobacteriaceae species NOT DETECTED NOT DETECTED Final   Enterobacter cloacae complex NOT DETECTED NOT DETECTED Final   Escherichia coli NOT DETECTED NOT DETECTED Final   Klebsiella oxytoca NOT DETECTED NOT DETECTED Final   Klebsiella pneumoniae NOT DETECTED NOT DETECTED Final   Proteus species NOT DETECTED NOT DETECTED Final   Serratia marcescens NOT DETECTED NOT DETECTED Final   Haemophilus  influenzae NOT DETECTED NOT DETECTED Final   Neisseria meningitidis NOT DETECTED NOT DETECTED Final   Pseudomonas aeruginosa NOT DETECTED NOT DETECTED Final   Candida albicans NOT DETECTED NOT DETECTED Final   Candida glabrata NOT DETECTED NOT DETECTED Final   Candida krusei NOT DETECTED NOT DETECTED Final   Candida parapsilosis NOT DETECTED NOT DETECTED Final   Candida tropicalis NOT DETECTED NOT DETECTED Final    Comment: Performed at [facility], [address], [place], [place] [address]  Urine culture     Status: Abnormal   Collection Time: 10/31/19  7:55 PM   Specimen: In/Out Cath Urine  Result Value Ref Range Status   Specimen Description   Final    IN/OUT CATH URINE Performed at [facility], [address], [place], [place] [address]    Special Requests   Final    NONE Performed at [facility], [address] [place], [place] [address]    Culture (A)  Final    30,000 COLONIES/mL MULTIPLE SPECIES PRESENT, SUGGEST RECOLLECTION   Report Status 11/02/2019 FINAL  Final  Aerobic/Anaerobic Culture (surgical/deep wound)     Status: None (Preliminary result)   Collection Time: 11/01/19  3:23 PM   Specimen: Abdomen; Abscess  Result Value Ref Range Status   Specimen Description   Final    ABDOMEN Performed at [facility], [address] [place], [place] [address]    Special Requests   Final    Normal Performed at [facility], [address] [place], [place] [address]    Gram Stain   Final    ABUNDANT WBC PRESENT, PREDOMINANTLY PMN MODERATE GRAM POSITIVE COCCI IN PAIRS IN CLUSTERS RARE GRAM POSITIVE RODS    Culture   Final    CULTURE REINCUBATED FOR BETTER GROWTH Performed at [facility], [place] [address] [place], [place] [address]    Report Status PENDING  Incomplete  Culture, blood (Routine X 2) w Reflex to ID Panel     Status: None   Collection Time: 11/02/19  1:11 AM   Specimen: BLOOD  Result Value Ref  Range Status   Specimen Description BLOOD BLOOD RIGHT HAND  Final   Special Requests   Final    BOTTLES DRAWN AEROBIC AND ANAEROBIC Blood Culture adequate volume   Culture   Final    NO GROWTH 5 DAYS Performed at [facility], [address] [place], [place] [address]    Report Status 11/07/2019 FINAL  Final  Culture, blood (Routine X 2) w Reflex to ID Panel     Status: None   Collection Time: 11/02/19  1:17 AM   Specimen: BLOOD  Result Value Ref Range Status   Specimen Description BLOOD BLOOD LEFT FOREARM  Final   Special Requests   Final    BOTTLES DRAWN AEROBIC AND ANAEROBIC Blood Culture adequate volume   Culture   Final    NO GROWTH 5 DAYS Performed at [facility], [address] [place], [place] [address]    Report Status 11/07/2019 FINAL  Final  MRSA PCR Screening     Status: None   Collection Time: 11/02/19 12:53 PM   Specimen: Nasopharyngeal  Result Value Ref Range Status   MRSA by PCR NEGATIVE NEGATIVE Final    Comment:        The GeneXpert MRSA Assay (FDA approved for NASAL specimens only), is one component of a comprehensive MRSA colonization surveillance program. It is not intended to diagnose MRSA infection nor to guide or monitor treatment for MRSA infections. Performed at [facility], [address] [place], [place] [address]          Radiology Studies: CT IMAGE GUIDED DRAINAGE BY PERCUTANEOUS CATHETER  Result Date: 11/06/2019 INDICATION: Poorly draining percutaneous cholecystostomy tube. EXAM: SCRATCH CT GUIDED DRAINAGE OF GALLBLADDER MEDICATIONS: The patient is currently admitted to the hospital and receiving intravenous antibiotics. The antibiotics were administered within an appropriate time frame prior to the initiation of the procedure. ANESTHESIA/SEDATION: None. COMPLICATIONS: None immediate. TECHNIQUE: After discussing the risks and benefits of this procedure with the patient informed consent was obtained. Right  upper quadrant was sterilely prepped and draped. Following local anesthesia 1% lidocaine the existing small percutaneous cholecystostomy tube was exchanged over an Amplatz extra stiff wire for a 12 French drainage catheter. 150 cc of thick pus removed. Catheter was sutured in place. There no complications. PROCEDURE: As above. FINDINGS: Successful up sizing of existing percutaneous cholecystostomy tube to a 12 French drainage tube. 150 cc of thick pus removed. No complications. Patient remained stable condition following the procedure. IMPRESSION: Successful up sizing of existing percutaneous cholecystostomy tube to a 12 French drainage tube. 150 cc of thick pus removed. Electronically Signed   By: Maisie Fushomas  Register   On: 11/06/2019 08:53        Scheduled Meds: . amLODipine  10 mg Oral Daily  . aspirin EC  81 mg Oral Daily  . atorvastatin  40 mg Oral q1800  . heparin injection (subcutaneous)  5,000 Units Subcutaneous Q8H  . insulin aspart  0-15 Units Subcutaneous TID WC  . insulin detemir  10 Units Subcutaneous QHS  . multivitamin with minerals  1 tablet Oral Daily  . polyethylene glycol  17 g Oral Daily  . primidone  50 mg Oral QID  . Ensure Max Protein  11 oz Oral BID  . senna-docusate  1 tablet Oral BID  . sodium chloride flush  5 mL Intracatheter Q8H   Continuous Infusions: . sodium chloride Stopped (11/06/19 0503)  . ampicillin-sulbactam (UNASYN) IV 3 g (11/08/19 0515)  . lactated ringers 100 mL/hr at 11/08/19 [id number]    Assessment & Plan:   Active Problems:   Acute on chronic cholecystitis   Abscess   Bacteremia   Gallbladder abscess s/p cholecystotomy and they drained 450 cc blood-tinged pus Cultures with Enterococcus and Streptococcus  continue on Unasyn for enterococcal/streptococcal  bacteremia. Repeat CAT scan demonstrating enlarging pericholecystic hepatic abscess Interventional radiology reconsulted Status post drain exchange on 11/06/2019 Continue to follow drain  output ID following-Continue Unasyn 3 g IV every 6 hours and will need for minimum 4 weeks Repeat right upper quadrant ultrasound and stated it would be better to perform CT for direct comparison to the old CT findings Will ask surgical team to reevaluate patient prior to discharge   Sepsis secondary to enterococcal and streptococcal bacteremia Blood culture on admission positive for Enterococcus and Streptococcus Patient remains on Unasyn, from minimum 4 weeks Surveillance blood cultures have been negative We will place PICC prior to discharge    DM2 SSI 0 to 15 units AC and at bedtime Metformin being held  AKI Creatinine much improved with treatment of infection and hydration  Hyponatremia Sodium normalized with hydration.  DVT prophylaxis: heparin Code Status: FULL Family Communication:  None at bedside  disposition Plan: Anticipate home with home health.  Patient remains on IV antibiotics at this time.  PICC placement ordered.  Cholecystostomy drain placed, then upsized on 11/06/2019.  Needs surgery to reevaluate to see if need reimaging prior to dc.        LOS: 8 days    Time spent: 45 min with >50% coc    Lynn Ito, MD [facility] Pager [phone]  If 7PM-7AM, please contact night-coverage [email or web address] Password [facility] 11/08/2019, 7:47 AM Patient ID: Kyle Ritter, male   DOB: [birth year], 74 y.o.   MRN: [id number]

## 2019-11-08 NOTE — Progress Notes (Signed)
Peripherally Inserted Central Catheter Placement  The IV Nurse has discussed with the patient and/or persons authorized to consent for the patient, the purpose of this procedure and the potential benefits and risks involved with this procedure.  The benefits include less needle sticks, lab draws from the catheter, and the patient may be discharged home with the catheter. Risks include, but not limited to, infection, bleeding, blood clot (thrombus formation), and puncture of an artery; nerve damage and irregular heartbeat and possibility to perform a PICC exchange if needed/ordered by physician.  Alternatives to this procedure were also discussed.  Bard Power PICC patient education guide, fact sheet on infection prevention and patient information card has been provided to patient /or left at bedside.  Patient gave verbal consent with Tia, RN as witness due to inability to sign well and patient preference.  PICC Placement Documentation  PICC Single Lumen 11/08/19 PICC Right Basilic 43 cm 0 cm (Active)  Indication for Insertion or Continuance of Line Home intravenous therapies (PICC only) 11/08/19 1731  Exposed Catheter (cm) 0 cm 11/08/19 1731  Site Assessment Clean;Dry;Intact 11/08/19 1731  Line Status Flushed;Saline locked;Blood return noted 11/08/19 1731  Dressing Type Transparent 11/08/19 1731  Dressing Status Clean;Dry;Intact 11/08/19 1731  Dressing Intervention New dressing 11/08/19 1731  Dressing Change Due 11/15/19 11/08/19 1731       Redith Drach, Lajean Manes 11/08/2019, 5:32 PM

## 2019-11-08 NOTE — Progress Notes (Signed)
PHARMACY CONSULT NOTE FOR:  OUTPATIENT  PARENTERAL ANTIBIOTIC THERAPY (OPAT)  Indication: Enterococcus fecalis and streptococcus gallolyticus bacteremia, gallbladder abscess/perforation, liver abscess Regimen: ampicillin/sulbactam 3gm IV q6h End date: 11/29/2019  IV antibiotic discharge orders are pended. To discharging provider:  please sign these orders via discharge navigator,  Select New Orders & click on the button choice - Manage This Unsigned Work.     Thank you for allowing pharmacy to be a part of this patient's care.  Juliette Alcide, PharmD, BCPS.   Work Cell: [phone] 11/08/2019 3:05 PM

## 2019-11-08 NOTE — Progress Notes (Signed)
Nutrition Follow Up Note   DOCUMENTATION CODES:   Not applicable  INTERVENTION:   D/c Ensure Max protein  Add Ensure Enlive po TID, each supplement provides 350 kcal and 20 grams of protein  MVI daily   NUTRITION DIAGNOSIS:   Inadequate oral intake related to acute illness as evidenced by meal completion < 25%.  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  Work-up in the ED reveals fever, leukocytosis and CT shows acute on chronic cholecystitis with massively enlarged gallbladder and possible hepatic abscess. Pt s/p IR drainage of abscess with percutaneous cholecystotomy 4/8   Pt s/p drain exchange on 11/06/2019  Pt reports improved appetite and oral intake; pt documented to be eating 30% of meals but he is drinking Ensure supplements. RD will exchange Ensure Max for Ensure Enlive as this will provide more calories. Pt currently NPO for abdominal ultrasound today. Per chart, pt down 4lbs since admit; RD will request weekly weights.   Medications reviewed and include: aspirin, heparin, insulin, miralax, MVI, senokot, unasyn, LRS @100ml /hr, unasyn   Labs reviewed: K 3.6 wnl Hgb 10.0(L), Hct 30.3(L) cbgs- 132, 167, 162, 161, 116 x 24 hrs  Diet Order:   Diet Order            Diet NPO time specified  Diet effective now             EDUCATION NEEDS:   Education needs have been addressed  Skin:  Skin Assessment: Reviewed RN Assessment(Incision flank s/p biliary drain)  Last BM:  4/14  Height:   Ht Readings from Last 1 Encounters:  10/31/19 5' 11.5" (1.816 m)    Weight:   Wt Readings from Last 1 Encounters:  11/05/19 80.8 kg    Ideal Body Weight:  78 kg  BMI:  Body mass index is  24.51 kg/m.  Estimated Nutritional Needs:   Kcal:  1900-2200kcal/day  Protein:  95-110g/day  Fluid:  >1.9L/day  01/05/20 MS, RD, LDN Please refer to [facility] for RD and/or RD on-call/weekend/after hours pager

## 2019-11-08 NOTE — Progress Notes (Signed)
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  Work-up in the ED reveals fever, leukocytosis and CT shows acute on chronic cholecystitis with massively enlarged gallbladder and possible hepatic abscess.  General surgery was consulted and recommended IR drainage of abscess with percutaneous cholecystotomy. Patient underwent cholecystotomy and they drained 450 cc blood-tinged pus, s/p drain change on 11/06/19.   OT comments  Kyle Ritter presents to OT with pleasant mood, reporting he feels better compared to previous days in hospital.  Pt politely refused any OOB activity, stating that he gets out of bed when needed and has no self care needs at the moment.  Kyle Ritter reports ambulating independently to the toilet in bathroom when needed.  OTR educated pt on fall precautions and encouraged him to utilize RW and/or notify nursing to manage IV when needing to use bathroom while in hospital.  OTR provided education on RW techniques and benefits, pt verbalized understanding.  Kyle Ritter demonstrates good motivation to continue strengthening and participation in meaningful occupations.  He reports wanting to return to golf and doing yard work when he goes home.  OTR educated pt on energy conservation strategies to use after d/c, encouraging pt to stay active but implement frequent rest breaks while still recovering.  Pt verbalizes understanding.  Kyle Ritter will continue to benefit from skilled OT services while in acute setting to optimize safety and independence in ADLs.  HHOT with intermittent supervision remains most appropriate discharge recommendation.   Follow Up Recommendations  Home health OT;Supervision - Intermittent    Equipment  Recommendations  3 in 1 bedside commode    Recommendations for Other Services      Precautions / Restrictions Precautions Precautions: Fall Precaution Comments: abdominal drain Restrictions Weight Bearing Restrictions: No       Mobility Bed Mobility Overal bed mobility: Modified Independent                Transfers Overall transfer level: Needs assistance     Sit to Stand: Supervision         General transfer comment: Pt politely refused OOB activity today, reporting he has been ambulating to bathroom today and has no self care needs.  Per pt report, he is ambulating independently to bathroom.    Balance                                           ADL either performed or assessed with clinical judgement   ADL                                         General ADL Comments: Pt generally requires supervision for ADLs.  Per pt report, he is ambulating independently to bathroom, using grab bars for toilet transfers.     Vision Patient Visual Report: No change from baseline     Perception     Praxis      Cognition Arousal/Alertness: Awake/alert Behavior During Therapy: WFL for tasks assessed/performed Overall Cognitive Status: Within Functional Limits for tasks assessed  General Comments: Pt plesant and engaged in therapy        Exercises Other Exercises Other Exercises: provided education re: RW technique, benefits of using RW for BUE support and increased stability, energy conservation strategies, fall prevention strategies, importance of OOB activity   Shoulder Instructions       General Comments abdominal drain intact    Pertinent Vitals/ Pain       Pain Assessment: No/denies pain Pain Intervention(s): Limited activity within patient's tolerance;Monitored during session  Home Living                                          Prior  Functioning/Environment              Frequency  Min 2X/week        Progress Toward Goals  OT Goals(current goals can now be found in the care plan section)  Progress towards OT goals: Progressing toward goals  Acute Rehab OT Goals Patient Stated Goal: to go home OT Goal Formulation: With patient Time For Goal Achievement: 11/19/19 Potential to Achieve Goals: Good  Plan      Co-evaluation                 AM-PAC OT "6 Clicks" Daily Activity     Outcome Measure   Help from another person eating meals?: None Help from another person taking care of personal grooming?: None Help from another person toileting, which includes using toliet, bedpan, or urinal?: A Little Help from another person bathing (including washing, rinsing, drying)?: A Little Help from another person to put on and taking off regular upper body clothing?: None Help from another person to put on and taking off regular lower body clothing?: A Little 6 Click Score: 21    End of Session    OT Visit Diagnosis: Unsteadiness on feet (R26.81);Other abnormalities of gait and mobility (R26.89)   Activity Tolerance Patient tolerated treatment well   Patient Left in bed;with call bell/phone within reach;with bed alarm set   Nurse Communication          Time: 5784-6962 OT Time Calculation (min): 17 min  Charges: OT General Charges $OT Visit: 1 Visit OT Treatments $Self Care/Home Management : 8-22 mins  Myrtie Hawk Desha Bitner, OTR/L 11/08/19, 2:44 PM

## 2019-11-08 NOTE — Progress Notes (Signed)
At bedside to obtain consent for PICC.  After explaining procedure, risk, benefits to patient, he stated that he would like to talk with his sister about it before giving consent.  When asked if he would call her to discuss, he stated that he would rather her be in person for explanation.  He is unsure if she will be coming this afternoon or not.  Requested that he alert his nurse if she arrives so that she can notify IV team that sister is at bedside.  He is agreeable.  Spoke with Tia, RN regarding above and she will notify IV team if sister arrives.

## 2019-11-08 NOTE — Progress Notes (Signed)
Seen and examined briefly cholecystitis with perforation and intra-abdominal abscess requiring drain placement.  He is continued to improve but he still have some soreness.  No fevers no chills.  He is social situation is that he lives alone and he is likely can go home with a PICC line. U/S personally reviewed, not best study to assess collection Abd: Soft drain in place no peritonitis.  Drain with bile output.  A/P We will obtain CT a/P No surgical intervention D/W the family and Dr. Marylu Lund in detail I spent 25 minutes in this encounter w 50% spent coordination and counseling

## 2019-11-08 NOTE — Treatment Plan (Signed)
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  Gall bladder abscess, perforation, perihepatic abscess Bladder neck stone obstruction  Baseline Creatinine <1  No Known Allergies  OPAT Orders Discharge antibiotics: Unasyn 3 grams IV every 6hours ( can be given as an infusion 12 grams every 24 hours )  End Date:11/29/19   [facility] Care Per Protocol:  Labs weekly  Monday while on IV antibiotics: _X_ CBC with differential  X__ CMP  _X_ Please pull PIC at completion of IV antibiotics  Fax weekly labs to Dr.Shaniya Tashiro [phone]  Clinic Follow Up Appt: in 2 weeks Call [phone] to make appt

## 2019-11-08 NOTE — Progress Notes (Signed)
Attempted dexterity challenge with syringe and cap, patient was able to open but will be unable to keep aseptic technique. Tried to spike bag but hands were shaking too bad to accomplish.

## 2019-11-08 NOTE — Progress Notes (Signed)
CT pers. Reviewed w some  Improvement  But not complete resolution of hepatic abscess. Cholecystostomy tube in place and clinically improving. May be worth discussing w IR about feasibility of hepatic abscess drainage.

## 2019-11-08 NOTE — Progress Notes (Signed)
Chief Complaint: Patient was seen today for RUQ perc chole/hepatic abscess drian   Supervising Physician: Ruel Favors  Patient Status: [facility] - In-pt  Subjective: S/p perc chole drain on 4/8 and subsequent exchange/upsize on 4/13. Pt feeling much better. Good output from drain since exchange. Pt reports drain is being flushed multiple times per day.  Objective: Physical Exam: BP (!) 144/77   Pulse 70   Temp 98.2 F (36.8 C) (Oral)   Resp 14   Ht 5' 11.5" (1.816 m)   Wt 80.8 kg   SpO2 93%   BMI 24.51 kg/m  RUQ drain intact, site clean, NT Output turbid bilious Flushed easily with 10 mL NS, return of same turbid bile   Current Facility-Administered Medications:  .  0.9 %  sodium chloride infusion, , Intravenous, PRN, Tresa Moore, MD, Stopped at 11/06/19 0503 .  amLODipine (NORVASC) tablet 10 mg, 10 mg, Oral, Daily, Acheampong, Genice Rouge, MD, 10 mg at 11/08/19 0914 .  Ampicillin-Sulbactam (UNASYN) 3 g in sodium chloride 0.9 % 100 mL IVPB, 3 g, Intravenous, Q6H, Ravishankar, Jayashree, MD, Last Rate: 200 mL/hr at 11/08/19 0515, 3 g at 11/08/19 0515 .  aspirin EC tablet 81 mg, 81 mg, Oral, Daily, Acheampong, Genice Rouge, MD, 81 mg at 11/08/19 0910 .  atorvastatin (LIPITOR) tablet 40 mg, 40 mg, Oral, q1800, Acheampong, Genice Rouge, MD, 40 mg at 11/07/19 1741 .  feeding supplement (ENSURE ENLIVE) (ENSURE ENLIVE) liquid 237 mL, 237 mL, Oral, TID BM, Amery, Sahar, MD .  heparin injection 5,000 Units, 5,000 Units, Subcutaneous, Q8H, Mila Merry A, RPH, 5,000 Units at 11/08/19 0512 .  HYDROcodone-acetaminophen (NORCO/VICODIN) 5-325 MG per tablet 1-2 tablet, 1-2 tablet, Oral, Q4H PRN, Acheampong, Genice Rouge, MD, 2 tablet at 11/08/19 0915 .  HYDROmorphone (DILAUDID) injection 0.5 mg, 0.5 mg, Intravenous, Q4H PRN, Acheampong, Genice Rouge, MD, 0.5 mg at 11/06/19 1854 .  insulin aspart (novoLOG) injection 0-15 Units, 0-15 Units, Subcutaneous, TID WC, Acheampong, Genice Rouge, MD, 3 Units at  11/07/19 1735 .  insulin detemir (LEVEMIR) injection 10 Units, 10 Units, Subcutaneous, QHS, Acheampong, Genice Rouge, MD, 10 Units at 11/07/19 2242 .  lactated ringers infusion, , Intravenous, Continuous, Willy Eddy, MD, Last Rate: 100 mL/hr at 11/08/19 0633, New Bag at 11/08/19 [phone] .  multivitamin with minerals tablet 1 tablet, 1 tablet, Oral, Daily, Georgeann Oppenheim, Sudheer B, MD, 1 tablet at 11/08/19 0910 .  nitroGLYCERIN (NITROSTAT) SL tablet 0.4 mg, 0.4 mg, Sublingual, PRN, Acheampong, Genice Rouge, MD .  polyethylene glycol (MIRALAX / GLYCOLAX) packet 17 g, 17 g, Oral, Daily, Sreenath, Sudheer B, MD, 17 g at 11/05/19 0913 .  primidone (MYSOLINE) tablet 50 mg, 50 mg, Oral, QID, Acheampong, Genice Rouge, MD, 50 mg at 11/08/19 0915 .  senna-docusate (Senokot-S) tablet 1 tablet, 1 tablet, Oral, BID, Georgeann Oppenheim, Sudheer B, MD, 1 tablet at 11/07/19 2053 .  sodium chloride flush (NS) 0.9 % injection 5 mL, 5 mL, Intracatheter, Q8H, Berdine Dance, MD, 5 mL at 11/08/19 0515  Labs: CBC Recent Labs    11/07/19 0558 11/08/19 0428  WBC 12.0* 10.0  HGB 9.8* 10.0*  HCT 29.4* 30.3*  PLT 363 354   BMET Recent Labs    11/07/19 0558 11/08/19 0428  NA 138 138  K 4.1 3.6  CL 100 102  CO2 30 28  GLUCOSE 125* 151*  BUN 11 12  CREATININE 0.91 0.92  CALCIUM 8.5* 8.9     Studies/Results: No results found.  Assessment/Plan: Perforated calculous cholecystitis  with associated adjacent abscess S/p perc chole drain and upsize. Improved output. [place] today looked improved. Continue flushing and drain care.  IR cont to follow    LOS: 8 days   I spent a total of 15 minutes in face to face in clinical consultation, greater than 50% of which was counseling/coordinating care for perc chole drain  Ascencion Dike PA-C 11/08/2019 10:49 AM

## 2019-11-08 NOTE — TOC Progression Note (Signed)
Transition of Care [facility]) - Progression Note    Patient Details  Name: REG BIRCHER MRN: [id number] Date of Birth: 26-Feb-1946  Transition of Care [facility]) CM/SW Contact  Margarito Liner, LCSW Phone Number: 11/08/2019, 12:31 PM  Clinical Narrative: [facility] is able to meet patient's home care needs. Patient is aware and agreeable. Notified him that [facility] will provide his home infusion needs instead of [facility]. Unsure of discharge date at this time. If tomorrow, patient will have to discharge early and be at home by 12:00 to meet with the home health nurse for IV abx education. Sent message to MD to notify.    Expected Discharge Plan: Home w Home Health Services Barriers to Discharge: Continued Medical Work up  Expected Discharge Plan and Services Expected Discharge Plan: Home w Home Health Services     Post Acute Care Choice: Home Health Living arrangements for the past 2 months: Mobile Home                                       Social Determinants of Health (SDOH) Interventions    Readmission Risk Interventions No flowsheet data found.

## 2019-11-09 ENCOUNTER — Other Ambulatory Visit: Payer: Self-pay | Admitting: Radiology

## 2019-11-09 DIAGNOSIS — L0291 Cutaneous abscess, unspecified: Secondary | ICD-10-CM | POA: Diagnosis not present

## 2019-11-09 DIAGNOSIS — R7881 Bacteremia: Secondary | ICD-10-CM | POA: Diagnosis not present

## 2019-11-09 DIAGNOSIS — N179 Acute kidney failure, unspecified: Secondary | ICD-10-CM | POA: Diagnosis not present

## 2019-11-09 DIAGNOSIS — K812 Acute cholecystitis with chronic cholecystitis: Secondary | ICD-10-CM | POA: Diagnosis not present

## 2019-11-09 DIAGNOSIS — K810 Acute cholecystitis: Secondary | ICD-10-CM

## 2019-11-09 DIAGNOSIS — E871 Hypo-osmolality and hyponatremia: Secondary | ICD-10-CM

## 2019-11-09 LAB — CBC WITH DIFFERENTIAL/PLATELET
Abs Immature Granulocytes: 0.16 10*3/uL — ABNORMAL HIGH (ref 0.00–0.07)
Basophils Absolute: 0 10*3/uL (ref 0.0–0.1)
Basophils Relative: 0 %
Eosinophils Absolute: 0.2 10*3/uL (ref 0.0–0.5)
Eosinophils Relative: 2 %
HCT: 29.3 % — ABNORMAL LOW (ref 39.0–52.0)
Hemoglobin: 9.3 g/dL — ABNORMAL LOW (ref 13.0–17.0)
Immature Granulocytes: 2 %
Lymphocytes Relative: 13 %
Lymphs Abs: 1.2 10*3/uL (ref 0.7–4.0)
MCH: 28.5 pg (ref 26.0–34.0)
MCHC: 31.7 g/dL (ref 30.0–36.0)
MCV: 89.9 fL (ref 80.0–100.0)
Monocytes Absolute: 0.8 10*3/uL (ref 0.1–1.0)
Monocytes Relative: 8 %
Neutro Abs: 7.3 10*3/uL (ref 1.7–7.7)
Neutrophils Relative %: 75 %
Platelets: 350 10*3/uL (ref 150–400)
RBC: 3.26 MIL/uL — ABNORMAL LOW (ref 4.22–5.81)
RDW: 13.4 % (ref 11.5–15.5)
WBC: 9.8 10*3/uL (ref 4.0–10.5)
nRBC: 0 % (ref 0.0–0.2)

## 2019-11-09 LAB — BASIC METABOLIC PANEL
Anion gap: 5 (ref 5–15)
BUN: 11 mg/dL (ref 8–23)
CO2: 32 mmol/L (ref 22–32)
Calcium: 8.8 mg/dL — ABNORMAL LOW (ref 8.9–10.3)
Chloride: 102 mmol/L (ref 98–111)
Creatinine, Ser: 0.95 mg/dL (ref 0.61–1.24)
GFR calc Af Amer: >60 mL/min (ref >60)
GFR calc non Af Amer: >60 mL/min (ref >60)
Glucose, Bld: 163 mg/dL — ABNORMAL HIGH (ref 70–99)
Potassium: 4.2 mmol/L (ref 3.5–5.1)
Sodium: 139 mmol/L (ref 135–145)

## 2019-11-09 LAB — GLUCOSE, CAPILLARY
Glucose-Capillary: 123 mg/dL — ABNORMAL HIGH (ref 70–99)
Glucose-Capillary: 235 mg/dL — ABNORMAL HIGH (ref 70–99)
Glucose-Capillary: 248 mg/dL — ABNORMAL HIGH (ref 70–99)
Glucose-Capillary: 182 mg/dL — ABNORMAL HIGH (ref 70–99)

## 2019-11-09 MED ORDER — PIPERACILLIN-TAZOBACTAM 3.375 G IVPB
3.3750 g | Freq: Three times a day (TID) | INTRAVENOUS | Status: DC
  Administered 2019-11-09 – 2019-11-11 (×6): 3.375 g via INTRAVENOUS
  Filled 2019-11-09 (×6): qty 50

## 2019-11-09 NOTE — Progress Notes (Signed)
11/09/19  Patient's CT scan personally viewed.  Overall, percutaneous cholecystostomy tube is in appropriate location decompressing the gallbladder.  The liver abscess, which I believe is part of the complication of this gallbladder, is also improved and smaller in size.  I believe the cholecystostomy tube is helping drain the abscess as well.  Discussed with IR this morning and no additional drain is needed.  May d/c from surgical standpoint.  He will get set up with IR drain clinic for further monitoring of the drain and any appropriate follow up imaging prior to drain removal.  He may follow up with Dr. Everlene Farrier in two weeks.  Henrene Dodge, MD

## 2019-11-09 NOTE — Progress Notes (Signed)
Drain flushed at this time 30cc out

## 2019-11-09 NOTE — TOC Progression Note (Addendum)
Transition of Care [facility]) - Progression Note    Patient Details  Name: KIPP SHANK MRN: [id number] Date of Birth: Jun 10, 1946  Transition [facility] Care [facility]) CM/SW Contact  Margarito Liner, LCSW Phone Number: 11/09/2019, 10:07 AM  Clinical Narrative:  Delos Haring carries a pump called the [employer] that [facility]'s clinicians are not familiar with. [facility] representative is reaching out to other home health agencies to see if they are able to assist.   1:01 pm: Due to changes made to antibiotic regimen, [facility] is able to accept patient. Infusion company has spoken with patient's sister. Home health nurse will meet with patient and his sister at home at noon on Sunday for education. Patient can discharge tomorrow if stable after he has received his daily dose.  Expected Discharge Plan: Home w Home Health Services Barriers to Discharge: Continued Medical Work up  Expected Discharge Plan and Services Expected Discharge Plan: Home w Home Health Services     Post Acute Care Choice: Home Health Living arrangements for the past 2 months: Mobile Home                                       Social Determinants of Health (SDOH) Interventions    Readmission Risk Interventions No flowsheet data found.

## 2019-11-09 NOTE — Progress Notes (Signed)
Patient [place] drain flushed at this time. Did not seem to be clogged and patient tolerated well. Bag emptied at this point with 30cc hour. Drainaged sticky and appear to milky yellow in appearance. Safety checks completed and call light placed within reach.

## 2019-11-09 NOTE — Treatment Plan (Signed)
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

## 2019-11-09 NOTE — Progress Notes (Signed)
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 To discharging provider:  please sign these orders via discharge navigator,  Select New Orders & click on the button choice - Manage This Unsigned Work.     Thank you for allowing pharmacy to be a part of this patient's care.  Juliette Alcide, PharmD, BCPS.   Work Cell: [phone] 11/09/2019 11:19 AM

## 2019-11-09 NOTE — Progress Notes (Signed)
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  Work-up in the ED reveals fever, leukocytosis and CT shows acute on chronic cholecystitis with massively enlarged gallbladder and possible hepatic abscess.  General surgery was consulted and recommended IR drainage of abscess with percutaneous cholecystotomy. Patient underwent cholecystotomy and they drained 450 cc blood-tinged pus, s/p drain change on 11/06/19.    PT Comments    Pt was in semi fowlers position upon arriving. Agrees toPT session and is cooperative throughout. Therapist attempted to get pt to use [facility] but pt unwilling. Fearful of falling and reports feeling to weak to use this date. He was able to exit bed and stand with supervision and then ambulated with RW 160 ft with CGA for safety. Pt ambulated with very slow cadence. No LOB with UE support on RW however does have Unsteadiness when he walked away from [place] when returned to room. Therapist recommends continued use of RW until balance returns to PLOF. Overall pt tolerated session well. He lives alone and requesting to go SNF however therapist feels pt may be able to return home with HHPT at [place]. Acute PT will continue to follow per POC progressing as able.     Follow Up Recommendations  Home health PT     Equipment Recommendations  Rolling walker with 5" wheels    Recommendations for Other Services       Precautions / Restrictions Precautions Precautions: Fall Precaution Comments: abdominal drain Restrictions Weight Bearing Restrictions: No    Mobility  Bed Mobility Overal bed mobility: Modified Independent Bed Mobility: Supine to Sit     Supine to sit: Supervision;HOB elevated Sit to supine: Supervision    General bed mobility comments: Pt was able to exit/re-enter bed with supervision only.  Transfers Overall transfer level: Needs assistance Equipment used: Rolling walker (2 wheeled) Transfers: Sit to/from Stand Sit to Stand: Supervision         General transfer comment: Pt was able to STS from EOB to RW with supervision. Pt unwilling to trial SPC use this daste for fear or falling and feeling weakness in BLEs  Ambulation/Gait Ambulation/Gait assistance: Min guard Gait Distance (Feet): 160 Feet Assistive device: Rolling walker (2 wheeled) Gait Pattern/deviations: Trunk flexed Gait velocity: significantly decreased   General Gait Details: Pt was able to ambulate 1 lap in hallway with RW. Unwilling to trial gait training with SPC 2/2 to fear of falling. pt did ambulate away from RW to EOB after returning from ambulation in hallway and was very unsteady. Recommend continued use of RW until balance/strength returns to PLOF.   Stairs             Wheelchair Mobility    Modified Rankin (Stroke Patients Only)       Balance                                            Cognition Arousal/Alertness: Awake/alert Behavior During Therapy: WFL for tasks assessed/performed Overall Cognitive Status: Within Functional Limits for tasks assessed  General Comments: Pt is A and O x 4 and agreeable to PT session      Exercises      General Comments        Pertinent Vitals/Pain Pain Assessment: No/denies pain Pain Score: 0-No pain Pain Location: drain site Pain Descriptors / Indicators: Discomfort Pain Intervention(s): Limited activity within patient's tolerance;Monitored during session    Home Living                      Prior Function            PT Goals (current goals can now be found in the care plan section) Acute Rehab PT Goals Patient Stated Goal: to go home Progress towards PT goals:  Progressing toward goals    Frequency    Min 2X/week      PT Plan Current plan remains appropriate    Co-evaluation              AM-PAC PT "6 Clicks" Mobility   Outcome Measure  Help needed turning from your back to your side while in a flat bed without using bedrails?: A Little Help needed moving from lying on your back to sitting on the side of a flat bed without using bedrails?: A Little Help needed moving to and from a bed to a chair (including a wheelchair)?: A Little Help needed standing up from a chair using your arms (e.g., wheelchair or bedside chair)?: A Little Help needed to walk in hospital room?: A Little Help needed climbing 3-5 steps with a railing? : A Little 6 Click Score: 18    End of Session Equipment Utilized During Treatment: Gait belt Activity Tolerance: Patient tolerated treatment well Patient left: in bed;with call bell/phone within reach Nurse Communication: Mobility status PT Visit Diagnosis: Unsteadiness on feet (R26.81);Muscle weakness (generalized) (M62.81);History of falling (Z91.81);Difficulty in walking, not elsewhere classified (R26.2)     Time: 8469-6295 PT Time Calculation (min) (ACUTE ONLY): 17 min  Charges:  $Gait Training: 8-22 mins                    Jetta Lout PTA 11/09/19, 1:57 PM

## 2019-11-09 NOTE — Progress Notes (Signed)
Drain flushed at this time patient tolerated well

## 2019-11-09 NOTE — Progress Notes (Signed)
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 Work-up in the ED reveals fever, leukocytosis and CT shows acute on chronic cholecystitis with massively enlarged gallbladder and possible hepatic abscess. General surgery was consulted and recommended IR drainage of abscess with percutaneous cholecystotomy.     Consultants:  IR, ID, general surgery  Procedures: Status post pericholecystic hepatic abscess drainage  Antimicrobials:  Unasyn   Subjective: Has no new complaints. Denies fever, chills, abd pain.  Objective: Vitals:   11/08/19 1941 11/09/19 0501 11/09/19 0854 11/09/19 1212  BP: (!) 152/67 (!) 173/79 124/68 139/62  Pulse: 67 68  63  Resp: 18 18  14   Temp: 98 F (36.7 C) 97.7 F (36.5 C)  98.6 F (37 C)  TempSrc: Oral Oral  Oral  SpO2: 96% 96%  95%  Weight:      Height:        Intake/Output Summary (Last 24 hours) at 11/09/2019 1449 Last data filed at 11/09/2019 1400 Gross per 24 hour  Intake 1861.76 ml  Output 310 ml  Net 1551.76 ml   Filed Weights   10/31/19 1825 11/05/19 1638  Weight: 82.5 kg 80.8 kg    Examination:  General exam: Appears calm and comfortable nad Respiratory system: Clear to auscultation. Respiratory effort normal.  No wheezing/rhonchi Cardiovascular system: S1 & S2 heard, RRR. No JVD, murmurs, rubs, gallops or clicks.  Gastrointestinal system: Abdomen is nondistended, soft and nontender.  Normal bowel sounds heard.Rt drain in place draining dark liquid Central nervous system: Alert and oriented x3.  Grossly intact Extremities: no edema Skin: warm, dry Psychiatry:  Mood & affect appropriate.     Data Reviewed: I have personally reviewed following labs and imaging studies  CBC: Recent Labs  Lab  11/03/19 0717 11/07/19 0558 11/08/19 0428 11/09/19 0529  WBC 13.4* 12.0* 10.0 9.8  NEUTROABS  --  9.7* 7.4 7.3  HGB 9.8* 9.8* 10.0* 9.3*  HCT 29.1* 29.4* 30.3* 29.3*  MCV 86.6 87.0 87.1 89.9  PLT 308 363 354 417   Basic Metabolic Panel: Recent Labs  Lab 11/03/19 0717 11/07/19 0558 11/08/19 0428 11/09/19 0529  NA 136 138 138 139  K 3.3* 4.1 3.6 4.2  CL 103 100 102 102  CO2 26 30 28  32  GLUCOSE 149* 125* 151* 163*  BUN 11 11 12 11   CREATININE 0.88 0.91 0.92 0.95  CALCIUM 8.2* 8.5* 8.9 8.8*   GFR: Estimated Creatinine Clearance: 72.7 mL/min (by C-G formula based on SCr of 0.95 mg/dL). Liver Function Tests: Recent Labs  Lab 11/03/19 0717  AST 23  ALT 55*  ALKPHOS 158*  BILITOT 0.8  PROT 6.3*  ALBUMIN 2.2*   Recent Labs  Lab 11/03/19 0717  LIPASE 17   No results for input(s): AMMONIA in the last 168 hours. Coagulation Profile: No results for input(s): INR, PROTIME in the last 168 hours. Cardiac Enzymes: No results for input(s): CKTOTAL, CKMB, CKMBINDEX, TROPONINI in the last 168 hours. BNP (last 3 results) No results for input(s): PROBNP in the last 8760 hours. HbA1C: No results for input(s): HGBA1C in the last 72 hours. CBG: Recent Labs  Lab 11/08/19 1204 11/08/19 1816 11/08/19 2128 11/09/19 0742 11/09/19 1139  GLUCAP 146* 184* 130* 123* 235*   Lipid Profile: No results for input(s): CHOL, HDL,  LDLCALC, TRIG, CHOLHDL, LDLDIRECT in the last 72 hours. Thyroid Function Tests: No results for input(s): TSH, T4TOTAL, FREET4, T3FREE, THYROIDAB in the last 72 hours. Anemia Panel: No results for input(s): VITAMINB12, FOLATE, FERRITIN, TIBC, IRON, RETICCTPCT in the last 72 hours. Sepsis Labs: No results for input(s): PROCALCITON, LATICACIDVEN in the last 168 hours.  Recent Results (from the past 240 hour(s))  Blood Culture (routine x 2)     Status: Abnormal   Collection Time: 10/31/19  6:30 PM   Specimen: BLOOD  Result Value Ref Range Status   Specimen  Description   Final    BLOOD BLOOD RIGHT HAND Performed at [facility], [address], [place], [place] [address]    Special Requests   Final    BOTTLES DRAWN AEROBIC AND ANAEROBIC Blood Culture adequate volume Performed at [facility], [address] [place], [place] [address]    Culture  Setup Time   Final    GRAM POSITIVE COCCI IN BOTH AEROBIC AND ANAEROBIC BOTTLES CRITICAL RESULT CALLED TO, READ BACK BY AND VERIFIED WITH: SHERRIE THOMPSON @0438  11/01/19 AKT    Culture (A)  Final    STREPTOCOCCUS GALLOLYTICUS ENTEROCOCCUS FAECIUM SUSCEPTIBILITIES PERFORMED ON PREVIOUS CULTURE WITHIN THE LAST 5 DAYS. Performed at [facility], [address], [place], [place]    Report Status 11/04/2019 FINAL  Final  Blood Culture (routine x 2)     Status: Abnormal   Collection Time: 10/31/19  6:30 PM   Specimen: BLOOD  Result Value Ref Range Status   Specimen Description   Final    BLOOD LEFT ANTECUBITAL Performed at [facility], [address] [place], [place]    Special Requests   Final    BOTTLES DRAWN AEROBIC AND ANAEROBIC Blood Culture results may not be optimal due to an inadequate volume of blood received in culture bottles Performed at [facility], [address], [place], [place]    Culture  Setup Time   Final    GRAM POSITIVE COCCI IN BOTH AEROBIC AND ANAEROBIC BOTTLES CRITICAL VALUE NOTED.  VALUE IS CONSISTENT WITH PREVIOUSLY REPORTED AND CALLED VALUE. Performed at [facility], [address] [place], [place]    Culture STREPTOCOCCUS GALLOLYTICUS ENTEROCOCCUS FAECIUM  (A)  Final   Report Status 11/04/2019 FINAL  Final   Organism ID, Bacteria STREPTOCOCCUS GALLOLYTICUS  Final   Organism ID, Bacteria ENTEROCOCCUS FAECIUM  Final      Susceptibility   Enterococcus faecium - MIC*    AMPICILLIN <=2 SENSITIVE Sensitive     VANCOMYCIN <=0.5 SENSITIVE Sensitive     GENTAMICIN  SYNERGY SENSITIVE Sensitive     * ENTEROCOCCUS FAECIUM   Streptococcus gallolyticus - MIC*    PENICILLIN 0.12 SENSITIVE Sensitive     CEFTRIAXONE 0.25 SENSITIVE Sensitive     ERYTHROMYCIN >=8 RESISTANT Resistant     LEVOFLOXACIN 2 SENSITIVE Sensitive     VANCOMYCIN 0.25 SENSITIVE Sensitive     * STREPTOCOCCUS GALLOLYTICUS  Blood Culture ID Panel (Reflexed)     Status: Abnormal   Collection Time: 10/31/19  6:30 PM  Result Value Ref Range Status   Enterococcus species DETECTED (A) NOT DETECTED Final    Comment: CRITICAL RESULT CALLED TO, READ BACK BY AND VERIFIED WITH: sherrie thompson @0438  11/01/19 akt    Vancomycin resistance NOT DETECTED NOT DETECTED Final   Listeria monocytogenes NOT DETECTED NOT DETECTED Final   Staphylococcus species NOT DETECTED NOT DETECTED Final  Staphylococcus aureus (BCID) NOT DETECTED NOT DETECTED Final   Streptococcus species DETECTED (A) NOT DETECTED Final    Comment: Not Enterococcus species, Streptococcus agalactiae, Streptococcus pyogenes, or Streptococcus pneumoniae. CRITICAL RESULT CALLED TO, READ BACK BY AND VERIFIED WITH: SHERRIE THOMPSON @0438  11/01/19 AKT    Streptococcus agalactiae NOT DETECTED NOT DETECTED Final   Streptococcus pneumoniae NOT DETECTED NOT DETECTED Final   Streptococcus pyogenes NOT DETECTED NOT DETECTED Final   Acinetobacter baumannii NOT DETECTED NOT DETECTED Final   Enterobacteriaceae species NOT DETECTED NOT DETECTED Final   Enterobacter cloacae complex NOT DETECTED NOT DETECTED Final   Escherichia coli NOT DETECTED NOT DETECTED Final   Klebsiella oxytoca NOT DETECTED NOT DETECTED Final   Klebsiella pneumoniae NOT DETECTED NOT DETECTED Final   Proteus species NOT DETECTED NOT DETECTED Final   Serratia marcescens NOT DETECTED NOT DETECTED Final   Haemophilus influenzae NOT DETECTED NOT DETECTED Final   Neisseria meningitidis NOT DETECTED NOT DETECTED Final   Pseudomonas aeruginosa NOT DETECTED NOT DETECTED Final   Candida  albicans NOT DETECTED NOT DETECTED Final   Candida glabrata NOT DETECTED NOT DETECTED Final   Candida krusei NOT DETECTED NOT DETECTED Final   Candida parapsilosis NOT DETECTED NOT DETECTED Final   Candida tropicalis NOT DETECTED NOT DETECTED Final    Comment: Performed at [facility], [address] [place], [place]  Urine culture     Status: Abnormal   Collection Time: 10/31/19  7:55 PM   Specimen: In/Out Cath Urine  Result Value Ref Range Status   Specimen Description   Final    IN/OUT CATH URINE Performed at [facility], [address], [place], [place]    Special Requests   Final    NONE Performed at [facility], [address] [place], [place]    Culture (A)  Final    30,000 COLONIES/mL MULTIPLE SPECIES PRESENT, SUGGEST RECOLLECTION   Report Status 11/02/2019 FINAL  Final  Aerobic/Anaerobic Culture (surgical/deep wound)     Status: None (Preliminary result)   Collection Time: 11/01/19  3:23 PM   Specimen: Abdomen; Abscess  Result Value Ref Range Status   Specimen Description   Final    ABDOMEN Performed at [facility], [address] [place], [place]    Special Requests   Final    Normal Performed at [facility], [address] [place], [place]    Gram Stain   Final    ABUNDANT WBC PRESENT, PREDOMINANTLY PMN MODERATE GRAM POSITIVE COCCI IN PAIRS IN CLUSTERS RARE GRAM POSITIVE RODS Performed at [facility], [address] [place], [place]    Culture   Final    FEW KLEBSIELLA PNEUMONIAE MODERATE STREPTOCOCCUS GALLOLYTICUS MODERATE ENTEROCOCCUS FAECIUM    Report Status PENDING  Incomplete   Organism ID, Bacteria STREPTOCOCCUS GALLOLYTICUS  Final   Organism ID, Bacteria ENTEROCOCCUS FAECIUM  Final   Organism ID, Bacteria KLEBSIELLA PNEUMONIAE  Final      Susceptibility   Enterococcus faecium - MIC*    AMPICILLIN <=2 SENSITIVE Sensitive      VANCOMYCIN <=0.5 SENSITIVE Sensitive     GENTAMICIN SYNERGY SENSITIVE Sensitive     * MODERATE ENTEROCOCCUS FAECIUM   Klebsiella pneumoniae - MIC*    AMPICILLIN >=32 RESISTANT Resistant     CEFAZOLIN <=4 SENSITIVE Sensitive     CEFEPIME <=1 SENSITIVE Sensitive     CEFTAZIDIME <=1 SENSITIVE Sensitive     CEFTRIAXONE <=1  SENSITIVE Sensitive     CIPROFLOXACIN <=0.25 SENSITIVE Sensitive     GENTAMICIN <=1 SENSITIVE Sensitive     IMIPENEM 0.5 SENSITIVE Sensitive     TRIMETH/SULFA <=20 SENSITIVE Sensitive     AMPICILLIN/SULBACTAM 8 SENSITIVE Sensitive     PIP/TAZO <=4 SENSITIVE Sensitive     * FEW KLEBSIELLA PNEUMONIAE   Streptococcus gallolyticus - MIC*    PENICILLIN 0.12 SENSITIVE Sensitive     CEFTRIAXONE <=0.12 SENSITIVE Sensitive     ERYTHROMYCIN >=8 RESISTANT Resistant     LEVOFLOXACIN 2 SENSITIVE Sensitive     VANCOMYCIN <=0.12 SENSITIVE Sensitive     * MODERATE STREPTOCOCCUS GALLOLYTICUS  Culture, blood (Routine X 2) w Reflex to ID Panel     Status: None   Collection Time: 11/02/19  1:11 AM   Specimen: BLOOD  Result Value Ref Range Status   Specimen Description BLOOD BLOOD RIGHT HAND  Final   Special Requests   Final    BOTTLES DRAWN AEROBIC AND ANAEROBIC Blood Culture adequate volume   Culture   Final    NO GROWTH 5 DAYS Performed at [facility], [address], [place], [place] [address]    Report Status 11/07/2019 FINAL  Final  Culture, blood (Routine X 2) w Reflex to ID Panel     Status: None   Collection Time: 11/02/19  1:17 AM   Specimen: BLOOD  Result Value Ref Range Status   Specimen Description BLOOD BLOOD LEFT FOREARM  Final   Special Requests   Final    BOTTLES DRAWN AEROBIC AND ANAEROBIC Blood Culture adequate volume   Culture   Final    NO GROWTH 5 DAYS Performed at [facility], [address] [place], [place] [address]    Report Status 11/07/2019 FINAL  Final  MRSA PCR Screening     Status: None   Collection Time: 11/02/19 12:53  PM   Specimen: Nasopharyngeal  Result Value Ref Range Status   MRSA by PCR NEGATIVE NEGATIVE Final    Comment:        The GeneXpert MRSA Assay (FDA approved for NASAL specimens only), is one component of a comprehensive MRSA colonization surveillance program. It is not intended to diagnose MRSA infection nor to guide or monitor treatment for MRSA infections. Performed at [facility], [address] [place], [place] [address]          Radiology Studies: CT ABDOMEN PELVIS W CONTRAST  Result Date: 11/08/2019 CLINICAL DATA:  Percutaneous cholecystostomy for perforated cholecystitis EXAM: CT ABDOMEN AND PELVIS WITH CONTRAST TECHNIQUE: Multidetector CT imaging of the abdomen and pelvis was performed using the standard protocol following bolus administration of intravenous contrast. CONTRAST:  OMNIPAQUE IOHEXOL 300 MG/ML  SOLN COMPARISON:  11/05/2019, 11/08/2019 FINDINGS: Lower chest: Trace right pleural effusion. No acute lung disease. Extensive atherosclerosis of the coronary vessels unchanged. Hepatobiliary: Percutaneous cholecystostomy tube is seen within a decompressed gallbladder. Intrahepatic abscess seen on prior study has decreased in size, now measuring up to 3.9 x 3.9 cm in greatest dimension. Calcified gallstone again identified. There is extensive pericholecystic fat stranding. No intrahepatic biliary duct dilation. Remainder of the liver is unremarkable. Pancreas: Unremarkable. No pancreatic ductal dilatation or surrounding inflammatory changes. Spleen: Normal in size without focal abnormality. Adrenals/Urinary Tract: Stable renal cysts. No urinary tract calculi or obstruction. The bladder is unremarkable. The adrenals are normal. Stomach/Bowel: There is diffuse colonic diverticulosis without diverticulitis. No bowel obstruction or ileus. Normal appendix right lower quadrant. Vascular/Lymphatic: Aortic atherosclerosis. No  enlarged abdominal or pelvic lymph nodes.  Reproductive: Prostate is unremarkable. Other: Bilateral fat containing inguinal hernias are again noted. There is now a small amount of fluid within the right inguinal hernia. Trace free fluid within the right upper quadrant. No free intraperitoneal gas. Musculoskeletal: No acute or destructive bony lesions. Reconstructed images demonstrate no additional findings. IMPRESSION: 1. Percutaneous cholecystostomy tube, with interval decompression of the gallbladder. Persistent gallbladder wall thickening and pericholecystic fat stranding consistent with cholecystitis. 2. Decreased hepatic abscess. 3. Trace right pleural effusion. 4. Bilateral fat containing inguinal hernias, now with a small amount of hernia in the right inguinal sac. 5. Diverticulosis without diverticulitis. Electronically Signed   By: Sharlet Salina M.D.   On: 11/08/2019 20:47   [place] EKG SITE RITE  Result Date: 11/08/2019 If Site Rite image not attached, placement could not be confirmed due to current cardiac rhythm.  US Abdomen Limited RUQ  Result Date: 11/08/2019 CLINICAL DATA:  Cholecystitis, abscess, follow-up EXAM: ULTRASOUND ABDOMEN LIMITED RIGHT UPPER QUADRANT COMPARISON:  Ultrasound 10/31/2019, CT 11/05/2019 FINDINGS: Gallbladder: Cholecystostomy tube in place. Markedly abnormal gallbladder with gallbladder wall thickening measuring 7 mm. 2 cm stone is noted within the gallbladder along with complex fluid/debris/sludge. Common bile duct: Diameter: Normal caliber, 3 mm Liver: Increased echotexture compatible with fatty infiltration. No focal abnormality or biliary ductal dilatation. Portal vein is patent on color Doppler imaging with normal direction of blood flow towards the liver. Other: None. IMPRESSION: Complex, markedly abnormal appearance of the gallbladder with marked gallbladder wall thickening, extensive debris/sludge and stones. Pericholecystic fluid present. Evaluation of the perforated cholecystitis and abscess would be better  performed with CT for direct comparison to old CT. Electronically Signed   By: Charlett Nose M.D.   On: 11/08/2019 10:48        Scheduled Meds: . amLODipine  10 mg Oral Daily  . aspirin EC  81 mg Oral Daily  . atorvastatin  40 mg Oral q1800  . Chlorhexidine Gluconate Cloth  6 each Topical Daily  . feeding supplement (ENSURE ENLIVE)  237 mL Oral TID BM  . heparin injection (subcutaneous)  5,000 Units Subcutaneous Q8H  . insulin aspart  0-15 Units Subcutaneous TID WC  . insulin detemir  10 Units Subcutaneous QHS  . multivitamin with minerals  1 tablet Oral Daily  . polyethylene glycol  17 g Oral Daily  . primidone  50 mg Oral QID  . senna-docusate  1 tablet Oral BID  . sodium chloride flush  10-40 mL Intracatheter Q12H  . sodium chloride flush  5 mL Intracatheter Q8H   Continuous Infusions: . sodium chloride Stopped (11/09/19 0129)  . lactated ringers 100 mL/hr at 11/09/19 0913  . piperacillin-tazobactam (ZOSYN)  IV 3.375 g (11/09/19 1154)    Assessment & Plan:   Active Problems:   Acute on chronic cholecystitis   Abscess   Bacteremia   Gallbladder abscess s/p cholecystotomy and they drained 450 cc blood-tinged pus Cultures with Enterococcus and Streptococcus  continue on Unasyn for enterococcal/streptococcal bacteremia. Repeat CAT scan demonstrating enlarging pericholecystic hepatic abscess Interventional radiology reconsulted Status post drain exchange on 11/06/2019 Continue to follow drain output ID following-Continue Unasyn 3 g IV every 6 hours and will need for minimum 4 weeks Repeat right upper quadrant ultrasound and stated it would be better to perform CT for direct comparison to the old CT findings Ct obtained.  Per surgery Dr. Aleen Campi, CT appears abscess is smaller and can be discharged from surgical standpoint with follow-up with Dr.  Proband in 2 weeks.  Sepsis secondary to enterococcal and streptococcal bacteremia Blood culture on admission positive for  Enterococcus and Streptococcus Patient remains on Unasyn, from minimum 4 weeks Surveillance blood cultures have been negative  PICC basement Trying to set up Zosyn 3.375 g IV every 6 hours with end date on 11/29/2019.  Will need CBC with differential, CMP, and pulling PICC at completion of IV antibiotics.  Is to follow-up with Dr. Joylene Draftavisankar in 2 weeks.   DM2 SSI 0 to 15 units AC and at bedtime Metformin being held  AKI Creatinine much improved with treatment of infection and hydration  Hyponatremia Sodium normalized with hydration.  DVT prophylaxis: heparin Code Status: FULL Family Communication:  None at bedside  disposition Plan: Anticipate home with home health.  Versus SNF Barrier: Home versus SNF.  Trying to set up IV antibiotics home infusion if sister able to do this if not needs SNF.         LOS: 9 days    Time spent: 45 min with >50% coc    Lynn ItoSahar Emmakate Hypes, MD [facility] Pager [phone]  If 7PM-7AM, please contact night-coverage [email or web address] Password [email or web address] 11/09/2019, 2:49 PM Patient ID: Kyle Ritter, male   DOB: 09/23/1945, 74 y.o.

## 2019-11-09 NOTE — Progress Notes (Signed)
Chief Complaint: Patient was seen today for RUQ perc chole/hepatic abscess drian   Supervising Physician: Daryll Brod  Patient Status: Kyle Ritter - In-pt  Subjective: S/p perc chole drain on 4/8 and subsequent exchange/upsize on 4/13. Pt feeling much better. Tol reg diet Good output from drain since exchange.   Objective: Physical Exam: BP 124/68   Pulse 68   Temp 97.7 F (36.5 C) (Oral)   Resp 18   Ht 5' 11.5" (1.816 m)   Wt 80.8 kg   SpO2 96%   BMI 24.51 kg/m  RUQ drain intact, site clean, NT Output now more thin transparent bile   Current Facility-Administered Medications:  .  0.9 %  sodium chloride infusion, , Intravenous, PRN, Sidney Ace, MD, Stopped at 11/09/19 0129 .  amLODipine (NORVASC) tablet 10 mg, 10 mg, Oral, Daily, Acheampong, Warnell Bureau, MD, 10 mg at 11/09/19 0856 .  Ampicillin-Sulbactam (UNASYN) 3 g in sodium chloride 0.9 % 100 mL IVPB, 3 g, Intravenous, Q6H, Ravishankar, Jayashree, MD, Last Rate: 200 mL/hr at 11/09/19 0546, 3 g at 11/09/19 0546 .  aspirin EC tablet 81 mg, 81 mg, Oral, Daily, Acheampong, Warnell Bureau, MD, 81 mg at 11/09/19 0855 .  atorvastatin (LIPITOR) tablet 40 mg, 40 mg, Oral, q1800, Acheampong, Warnell Bureau, MD, 40 mg at 11/08/19 1823 .  Chlorhexidine Gluconate Cloth 2 % PADS 6 each, 6 each, Topical, Daily, Nolberto Hanlon, MD, 6 each at 11/09/19 0500 .  feeding supplement (ENSURE ENLIVE) (ENSURE ENLIVE) liquid 237 mL, 237 mL, Oral, TID BM, Amery, Sahar, MD, 237 mL at 11/09/19 0858 .  heparin injection 5,000 Units, 5,000 Units, Subcutaneous, Q8H, Rito Ehrlich A, RPH, 5,000 Units at 11/09/19 0546 .  HYDROcodone-acetaminophen (NORCO/VICODIN) 5-325 MG per tablet 1-2 tablet, 1-2 tablet, Oral, Q4H PRN, Acheampong, Warnell Bureau, MD, 2 tablet at 11/08/19 2223 .  HYDROmorphone (DILAUDID) injection 0.5 mg, 0.5 mg, Intravenous, Q4H PRN, Acheampong, Warnell Bureau, MD, 0.5 mg at 11/06/19 1854 .  insulin aspart (novoLOG) injection 0-15 Units, 0-15 Units,  Subcutaneous, TID WC, Acheampong, Warnell Bureau, MD, 2 Units at 11/09/19 [id number] .  insulin detemir (LEVEMIR) injection 10 Units, 10 Units, Subcutaneous, QHS, Acheampong, Warnell Bureau, MD, 10 Units at 11/08/19 2223 .  lactated ringers infusion, , Intravenous, Continuous, Merlyn Lot, MD, Last Rate: 100 mL/hr at 11/09/19 0913, New Bag at 11/09/19 0913 .  multivitamin with minerals tablet 1 tablet, 1 tablet, Oral, Daily, Ralene Muskrat B, MD, 1 tablet at 11/09/19 0855 .  nitroGLYCERIN (NITROSTAT) SL tablet 0.4 mg, 0.4 mg, Sublingual, PRN, Acheampong, Warnell Bureau, MD .  polyethylene glycol (MIRALAX / GLYCOLAX) packet 17 g, 17 g, Oral, Daily, Sreenath, Sudheer B, MD, 17 g at 11/09/19 0855 .  primidone (MYSOLINE) tablet 50 mg, 50 mg, Oral, QID, Acheampong, Warnell Bureau, MD, 50 mg at 11/09/19 0856 .  senna-docusate (Senokot-S) tablet 1 tablet, 1 tablet, Oral, BID, Priscella Mann, Sudheer B, MD, 1 tablet at 11/09/19 0855 .  sodium chloride flush (NS) 0.9 % injection 10-40 mL, 10-40 mL, Intracatheter, Q12H, Nolberto Hanlon, MD, 10 mL at 11/08/19 2224 .  sodium chloride flush (NS) 0.9 % injection 10-40 mL, 10-40 mL, Intracatheter, PRN, Kurtis Bushman, Sahar, MD .  sodium chloride flush (NS) 0.9 % injection 5 mL, 5 mL, Intracatheter, Q8H, Greggory Keen, MD, 5 mL at 11/09/19 0546  Labs: CBC Recent Labs    11/08/19 0428 11/09/19 0529  WBC 10.0 9.8  HGB 10.0* 9.3*  HCT 30.3* 29.3*  PLT 354 350   BMET Recent  Labs    11/08/19 0428 11/09/19 0529  NA 138 139  K 3.6 4.2  CL 102 102  CO2 28 32  GLUCOSE 151* 163*  BUN 12 11  CREATININE 0.92 0.95  CALCIUM 8.9 8.8*     Studies/Results: CT ABDOMEN PELVIS W CONTRAST  Result Date: 11/08/2019 CLINICAL DATA:  Percutaneous cholecystostomy for perforated cholecystitis EXAM: CT ABDOMEN AND PELVIS WITH CONTRAST TECHNIQUE: Multidetector CT imaging of the abdomen and pelvis was performed using the standard protocol following bolus administration of intravenous contrast. CONTRAST:   OMNIPAQUE IOHEXOL 300 MG/ML  SOLN COMPARISON:  11/05/2019, 11/08/2019 FINDINGS: Lower chest: Trace right pleural effusion. No acute lung disease. Extensive atherosclerosis of the coronary vessels unchanged. Hepatobiliary: Percutaneous cholecystostomy tube is seen within a decompressed gallbladder. Intrahepatic abscess seen on prior study has decreased in size, now measuring up to 3.9 x 3.9 cm in greatest dimension. Calcified gallstone again identified. There is extensive pericholecystic fat stranding. No intrahepatic biliary duct dilation. Remainder of the liver is unremarkable. Pancreas: Unremarkable. No pancreatic ductal dilatation or surrounding inflammatory changes. Spleen: Normal in size without focal abnormality. Adrenals/Urinary Tract: Stable renal cysts. No urinary tract calculi or obstruction. The bladder is unremarkable. The adrenals are normal. Stomach/Bowel: There is diffuse colonic diverticulosis without diverticulitis. No bowel obstruction or ileus. Normal appendix right lower quadrant. Vascular/Lymphatic: Aortic atherosclerosis. No enlarged abdominal or pelvic lymph nodes. Reproductive: Prostate is unremarkable. Other: Bilateral fat containing inguinal hernias are again noted. There is now a small amount of fluid within the right inguinal hernia. Trace free fluid within the right upper quadrant. No free intraperitoneal gas. Musculoskeletal: No acute or destructive bony lesions. Reconstructed images demonstrate no additional findings. IMPRESSION: 1. Percutaneous cholecystostomy tube, with interval decompression of the gallbladder. Persistent gallbladder wall thickening and pericholecystic fat stranding consistent with cholecystitis. 2. Decreased hepatic abscess. 3. Trace right pleural effusion. 4. Bilateral fat containing inguinal hernias, now with a small amount of hernia in the right inguinal sac. 5. Diverticulosis without diverticulitis. Electronically Signed   By: Sharlet Salina M.D.   On:  11/08/2019 20:47   [place] EKG SITE RITE  Result Date: 11/08/2019 If Site Rite image not attached, placement could not be confirmed due to current cardiac rhythm.  US Abdomen Limited RUQ  Result Date: 11/08/2019 CLINICAL DATA:  Cholecystitis, abscess, follow-up EXAM: ULTRASOUND ABDOMEN LIMITED RIGHT UPPER QUADRANT COMPARISON:  Ultrasound 10/31/2019, CT 11/05/2019 FINDINGS: Gallbladder: Cholecystostomy tube in place. Markedly abnormal gallbladder with gallbladder wall thickening measuring 7 mm. 2 cm stone is noted within the gallbladder along with complex fluid/debris/sludge. Common bile duct: Diameter: Normal caliber, 3 mm Liver: Increased echotexture compatible with fatty infiltration. No focal abnormality or biliary ductal dilatation. Portal vein is patent on color Doppler imaging with normal direction of blood flow towards the liver. Other: None. IMPRESSION: Complex, markedly abnormal appearance of the gallbladder with marked gallbladder wall thickening, extensive debris/sludge and stones. Pericholecystic fluid present. Evaluation of the perforated cholecystitis and abscess would be better performed with CT for direct comparison to old CT. Electronically Signed   By: Charlett Nose M.D.   On: 11/08/2019 10:48    Assessment/Plan: Perforated calculous cholecystitis with associated adjacent abscess S/p perc chole drain and upsize. Improved output. CT showed marked improvement. Plan for discharge today/tomorrow. IR will contact pt to schedule outpt follow up.    LOS: 9 days   I spent a total of 15 minutes in face to face in clinical consultation, greater than 50% of which was counseling/coordinating care for  perc chole drain  Brayton El PA-C 11/09/2019 9:24 AM

## 2019-11-10 DIAGNOSIS — E871 Hypo-osmolality and hyponatremia: Secondary | ICD-10-CM | POA: Diagnosis not present

## 2019-11-10 DIAGNOSIS — A409 Streptococcal sepsis, unspecified: Secondary | ICD-10-CM

## 2019-11-10 DIAGNOSIS — R7881 Bacteremia: Secondary | ICD-10-CM | POA: Diagnosis not present

## 2019-11-10 DIAGNOSIS — L0291 Cutaneous abscess, unspecified: Secondary | ICD-10-CM | POA: Diagnosis not present

## 2019-11-10 DIAGNOSIS — K812 Acute cholecystitis with chronic cholecystitis: Secondary | ICD-10-CM | POA: Diagnosis not present

## 2019-11-10 LAB — BASIC METABOLIC PANEL
Anion gap: 8 (ref 5–15)
BUN: 11 mg/dL (ref 8–23)
CO2: 29 mmol/L (ref 22–32)
Calcium: 9 mg/dL (ref 8.9–10.3)
Chloride: 99 mmol/L (ref 98–111)
Creatinine, Ser: 0.92 mg/dL (ref 0.61–1.24)
GFR calc Af Amer: >60 mL/min (ref >60)
GFR calc non Af Amer: >60 mL/min (ref >60)
Glucose, Bld: 149 mg/dL — ABNORMAL HIGH (ref 70–99)
Potassium: 4.5 mmol/L (ref 3.5–5.1)
Sodium: 136 mmol/L (ref 135–145)

## 2019-11-10 LAB — GLUCOSE, CAPILLARY
Glucose-Capillary: 115 mg/dL — ABNORMAL HIGH (ref 70–99)
Glucose-Capillary: 229 mg/dL — ABNORMAL HIGH (ref 70–99)
Glucose-Capillary: 229 mg/dL — ABNORMAL HIGH (ref 70–99)
Glucose-Capillary: 203 mg/dL — ABNORMAL HIGH (ref 70–99)

## 2019-11-10 LAB — AEROBIC/ANAEROBIC CULTURE W GRAM STAIN (SURGICAL/DEEP WOUND): Special Requests: NORMAL

## 2019-11-10 MED ORDER — SODIUM CHLORIDE 0.9 % IV SOLN: INTRAVENOUS | Status: DC

## 2019-11-10 NOTE — TOC Progression Note (Addendum)
Transition of Care [facility]) - Progression Note    Patient Details  Name: Kyle Ritter MRN: [id number] Date of Birth: 20-Oct-1945  Transition of [facility]) CM/SW Contact  Haylen Shelnutt, [place], [place] Phone Number: 11/10/2019, 10:50 AM  Clinical Narrative:    Phone call from [place] from [facility] [phone] to follow up on discharge plan. This Child psychotherapist followed up with patient's provider regarding status of patient's discharge.  Alexis informed that patient is not set to discharge until tomorrow morning. HH nurse from [facility] will meet patient's sister at the home at noon on Sunday for education.  Verna Czech, [place] Clinical Social Worker [phone]  Expected Discharge Plan: Home w Home Health Services Barriers to Discharge: Continued Medical Work up  Expected Discharge Plan and Services Expected Discharge Plan: Home w Home Health Services     Post Acute Care Choice: Home Health Living arrangements for the past 2 months: Mobile Home                                       Social Determinants of Health (SDOH) Interventions    Readmission Risk Interventions No flowsheet data found.

## 2019-11-10 NOTE — Progress Notes (Signed)
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 Work-up in the ED reveals fever, leukocytosis and CT shows acute on chronic cholecystitis with massively enlarged gallbladder and possible hepatic abscess. General surgery was consulted and recommended IR drainage of abscess with percutaneous cholecystotomy.     Consultants:  IR, ID, general surgery  Procedures: Status post pericholecystic hepatic abscess drainage  Antimicrobials:  Unasyn   Subjective: Has no new complaints.  Denies fever, shortness of breath, abdominal pain  Objective: Vitals:   11/09/19 1212 11/09/19 2058 11/10/19 0422 11/10/19 1221  BP: 139/62 (!) 160/68 (!) 163/64 (!) 146/59  Pulse: 63 67 69 62  Resp: 14 16 16 14   Temp: 98.6 F (37 C) 98 F (36.7 C) 98.1 F (36.7 C) 98 F (36.7 C)  TempSrc: Oral Oral Oral Oral  SpO2: 95% 98% 97% 97%  Weight:      Height:        Intake/Output Summary (Last 24 hours) at 11/10/2019 1234 Last data filed at 11/10/2019 0828 Gross per 24 hour  Intake 2176.82 ml  Output 200 ml  Net 1976.82 ml   Filed Weights   10/31/19 1825 11/05/19 1638  Weight: 82.5 kg 80.8 kg    Examination:  General exam: Appears calm and comfortable, nad Respiratory system: Clear to auscultation. Respiratory effort normal.  No wheezing/rhonchi/Rales Cardiovascular system: S1 & S2 heard, RRR. No JVD, murmurs, rubs, gallops or clicks.  Gastrointestinal system: Abdomen is nondistended, soft and nontender.  Normal bowel sounds heard.Rt drain in place draining  With minimal drainage Central nervous system: Alert and oriented x3.  Grossly intact Extremities: no edema Skin: warm, dry Psychiatry:  Mood & affect appropriate in current setting.     Data Reviewed: I have  personally reviewed following labs and imaging studies  CBC: Recent Labs  Lab 11/07/19 0558 11/08/19 0428 11/09/19 0529  WBC 12.0* 10.0 9.8  NEUTROABS 9.7* 7.4 7.3  HGB 9.8* 10.0* 9.3*  HCT 29.4* 30.3* 29.3*  MCV 87.0 87.1 89.9  PLT 363 354 350   Basic Metabolic Panel: Recent Labs  Lab 11/07/19 0558 11/08/19 0428 11/09/19 0529 11/10/19 0724  NA 138 138 139 136  K 4.1 3.6 4.2 4.5  CL 100 102 102 99  CO2 30 28 32 29  GLUCOSE 125* 151* 163* 149*  BUN 11 12 11 11   CREATININE 0.91 0.92 0.95 0.92  CALCIUM 8.5* 8.9 8.8* 9.0   GFR: Estimated Creatinine Clearance: 75.1 mL/min (by C-G formula based on SCr of 0.92 mg/dL). Liver Function Tests: No results for input(s): AST, ALT, ALKPHOS, BILITOT, PROT, ALBUMIN in the last 168 hours. No results for input(s): LIPASE, AMYLASE in the last 168 hours. No results for input(s): AMMONIA in the last 168 hours. Coagulation Profile: No results for input(s): INR, PROTIME in the last 168 hours. Cardiac Enzymes: No results for input(s): CKTOTAL, CKMB, CKMBINDEX, TROPONINI in the last 168 hours. BNP (last 3 results) No results for input(s): PROBNP in the last 8760 hours. HbA1C: No results for input(s): HGBA1C in the last 72 hours. CBG: Recent Labs  Lab 11/09/19 1139 11/09/19 1639 11/09/19 2056 11/10/19 0732 11/10/19 1219  GLUCAP 235* 248* 182* 115* 229*   Lipid Profile: No results for input(s): CHOL, HDL, LDLCALC, TRIG, CHOLHDL, LDLDIRECT in the last 72 hours.  Thyroid Function Tests: No results for input(s): TSH, T4TOTAL, FREET4, T3FREE, THYROIDAB in the last 72 hours. Anemia Panel: No results for input(s): VITAMINB12, FOLATE, FERRITIN, TIBC, IRON, RETICCTPCT in the last 72 hours. Sepsis Labs: No results for input(s): PROCALCITON, LATICACIDVEN in the last 168 hours.  Recent Results (from the past 240 hour(s))  Blood Culture (routine x 2)     Status: Abnormal   Collection Time: 10/31/19  6:30 PM   Specimen: BLOOD  Result Value  Ref Range Status   Specimen Description   Final    BLOOD BLOOD RIGHT HAND Performed at [facility], [address], [place], [place] [address]    Special Requests   Final    BOTTLES DRAWN AEROBIC AND ANAEROBIC Blood Culture adequate volume Performed at [facility], [address] [place], [place] [address]    Culture  Setup Time   Final    GRAM POSITIVE COCCI IN BOTH AEROBIC AND ANAEROBIC BOTTLES CRITICAL RESULT CALLED TO, READ BACK BY AND VERIFIED WITH: SHERRIE THOMPSON  11/01/19 AKT    Culture (A)  Final    STREPTOCOCCUS GALLOLYTICUS ENTEROCOCCUS FAECIUM SUSCEPTIBILITIES PERFORMED ON PREVIOUS CULTURE WITHIN THE LAST 5 DAYS. Performed at [facility], [address] [place], [place] [address]    Report Status 11/04/2019 FINAL  Final  Blood Culture (routine x 2)     Status: Abnormal   Collection Time: 10/31/19  6:30 PM   Specimen: BLOOD  Result Value Ref Range Status   Specimen Description   Final    BLOOD LEFT ANTECUBITAL Performed at [facility], [address] [place], [place] [address]    Special Requests   Final    BOTTLES DRAWN AEROBIC AND ANAEROBIC Blood Culture results may not be optimal due to an inadequate volume of blood received in culture bottles Performed at [facility], [address] [place], [place] [address]    Culture  Setup Time   Final    GRAM POSITIVE COCCI IN BOTH AEROBIC AND ANAEROBIC BOTTLES CRITICAL VALUE NOTED.  VALUE IS CONSISTENT WITH PREVIOUSLY REPORTED AND CALLED VALUE. Performed at [facility], [address] [place], [place] [address]    Culture STREPTOCOCCUS GALLOLYTICUS ENTEROCOCCUS FAECIUM  (A)  Final   Report Status 11/04/2019 FINAL  Final   Organism ID, Bacteria STREPTOCOCCUS GALLOLYTICUS  Final   Organism ID, Bacteria ENTEROCOCCUS FAECIUM  Final      Susceptibility   Enterococcus faecium - MIC*    AMPICILLIN <=2 SENSITIVE Sensitive     VANCOMYCIN <=0.5 SENSITIVE  Sensitive     GENTAMICIN SYNERGY SENSITIVE Sensitive     * ENTEROCOCCUS FAECIUM   Streptococcus gallolyticus - MIC*    PENICILLIN 0.12 SENSITIVE Sensitive     CEFTRIAXONE 0.25 SENSITIVE Sensitive     ERYTHROMYCIN >=8 RESISTANT Resistant     LEVOFLOXACIN 2 SENSITIVE Sensitive     VANCOMYCIN 0.25 SENSITIVE Sensitive     * STREPTOCOCCUS GALLOLYTICUS  Blood Culture ID Panel (Reflexed)     Status: Abnormal   Collection Time: 10/31/19  6:30 PM  Result Value Ref Range Status   Enterococcus species DETECTED (A) NOT DETECTED Final    Comment: CRITICAL RESULT CALLED TO, READ BACK BY AND VERIFIED WITH: sherrie thompson  11/01/19 akt    Vancomycin resistance NOT DETECTED NOT DETECTED Final   Listeria monocytogenes NOT DETECTED NOT DETECTED Final   Staphylococcus species NOT DETECTED NOT DETECTED Final   Staphylococcus aureus (BCID) NOT DETECTED NOT DETECTED Final  Streptococcus species DETECTED (A) NOT DETECTED Final    Comment: Not Enterococcus species, Streptococcus agalactiae, Streptococcus pyogenes, or Streptococcus pneumoniae. CRITICAL RESULT CALLED TO, READ BACK BY AND VERIFIED WITH: SHERRIE THOMPSON @0438  11/01/19 AKT    Streptococcus agalactiae NOT DETECTED NOT DETECTED Final   Streptococcus pneumoniae NOT DETECTED NOT DETECTED Final   Streptococcus pyogenes NOT DETECTED NOT DETECTED Final   Acinetobacter baumannii NOT DETECTED NOT DETECTED Final   Enterobacteriaceae species NOT DETECTED NOT DETECTED Final   Enterobacter cloacae complex NOT DETECTED NOT DETECTED Final   Escherichia coli NOT DETECTED NOT DETECTED Final   Klebsiella oxytoca NOT DETECTED NOT DETECTED Final   Klebsiella pneumoniae NOT DETECTED NOT DETECTED Final   Proteus species NOT DETECTED NOT DETECTED Final   Serratia marcescens NOT DETECTED NOT DETECTED Final   Haemophilus influenzae NOT DETECTED NOT DETECTED Final   Neisseria meningitidis NOT DETECTED NOT DETECTED Final   Pseudomonas aeruginosa NOT DETECTED NOT  DETECTED Final   Candida albicans NOT DETECTED NOT DETECTED Final   Candida glabrata NOT DETECTED NOT DETECTED Final   Candida krusei NOT DETECTED NOT DETECTED Final   Candida parapsilosis NOT DETECTED NOT DETECTED Final   Candida tropicalis NOT DETECTED NOT DETECTED Final    Comment: Performed at [facility], [address] [place], [place] [address]  Urine culture     Status: Abnormal   Collection Time: 10/31/19  7:55 PM   Specimen: In/Out Cath Urine  Result Value Ref Range Status   Specimen Description   Final    IN/OUT CATH URINE Performed at [facility], [address] [place], [place] [address]    Special Requests   Final    NONE Performed at [facility], [address] [place], [place] [address]    Culture (A)  Final    30,000 COLONIES/mL MULTIPLE SPECIES PRESENT, SUGGEST RECOLLECTION   Report Status 11/02/2019 FINAL  Final  Aerobic/Anaerobic Culture (surgical/deep wound)     Status: None   Collection Time: 11/01/19  3:23 PM   Specimen: Abdomen; Abscess  Result Value Ref Range Status   Specimen Description   Final    ABDOMEN Performed at [facility], [address] [place], [place] [address]    Special Requests   Final    Normal Performed at [facility], [address] [place], [place] [address]    Gram Stain   Final    ABUNDANT WBC PRESENT, PREDOMINANTLY PMN MODERATE GRAM POSITIVE COCCI IN PAIRS IN CLUSTERS RARE GRAM POSITIVE RODS    Culture   Final    FEW KLEBSIELLA PNEUMONIAE MODERATE STREPTOCOCCUS GALLOLYTICUS MODERATE ENTEROCOCCUS FAECIUM NO ANAEROBES ISOLATED Performed at [facility], [address] [place], [place] [address]    Report Status 11/10/2019 FINAL  Final   Organism ID, Bacteria STREPTOCOCCUS GALLOLYTICUS  Final   Organism ID, Bacteria ENTEROCOCCUS FAECIUM  Final   Organism ID, Bacteria KLEBSIELLA PNEUMONIAE  Final      Susceptibility   Enterococcus faecium - MIC*     AMPICILLIN <=2 SENSITIVE Sensitive     VANCOMYCIN <=0.5 SENSITIVE Sensitive     GENTAMICIN SYNERGY SENSITIVE Sensitive     * MODERATE ENTEROCOCCUS FAECIUM   Klebsiella pneumoniae - MIC*    AMPICILLIN >=32 RESISTANT Resistant     CEFAZOLIN <=4 SENSITIVE Sensitive     CEFEPIME <=1 SENSITIVE Sensitive     CEFTAZIDIME <=1 SENSITIVE Sensitive     CEFTRIAXONE <=1 SENSITIVE Sensitive     CIPROFLOXACIN <=0.25  SENSITIVE Sensitive     GENTAMICIN <=1 SENSITIVE Sensitive     IMIPENEM 0.5 SENSITIVE Sensitive     TRIMETH/SULFA <=20 SENSITIVE Sensitive     AMPICILLIN/SULBACTAM 8 SENSITIVE Sensitive     PIP/TAZO <=4 SENSITIVE Sensitive     * FEW KLEBSIELLA PNEUMONIAE   Streptococcus gallolyticus - MIC*    PENICILLIN 0.12 SENSITIVE Sensitive     CEFTRIAXONE <=0.12 SENSITIVE Sensitive     ERYTHROMYCIN >=8 RESISTANT Resistant     LEVOFLOXACIN 2 SENSITIVE Sensitive     VANCOMYCIN <=0.12 SENSITIVE Sensitive     * MODERATE STREPTOCOCCUS GALLOLYTICUS  Culture, blood (Routine X 2) w Reflex to ID Panel     Status: None   Collection Time: 11/02/19  1:11 AM   Specimen: BLOOD  Result Value Ref Range Status   Specimen Description BLOOD BLOOD RIGHT HAND  Final   Special Requests   Final    BOTTLES DRAWN AEROBIC AND ANAEROBIC Blood Culture adequate volume   Culture   Final    NO GROWTH 5 DAYS Performed at [facility], [address] [place], [place] [address]    Report Status 11/07/2019 FINAL  Final  Culture, blood (Routine X 2) w Reflex to ID Panel     Status: None   Collection Time: 11/02/19  1:17 AM   Specimen: BLOOD  Result Value Ref Range Status   Specimen Description BLOOD BLOOD LEFT FOREARM  Final   Special Requests   Final    BOTTLES DRAWN AEROBIC AND ANAEROBIC Blood Culture adequate volume   Culture   Final    NO GROWTH 5 DAYS Performed at [facility], [address] [place], [place] [address]    Report Status 11/07/2019 FINAL  Final  MRSA PCR Screening     Status:  None   Collection Time: 11/02/19 12:53 PM   Specimen: Nasopharyngeal  Result Value Ref Range Status   MRSA by PCR NEGATIVE NEGATIVE Final    Comment:        The GeneXpert MRSA Assay (FDA approved for NASAL specimens only), is one component of a comprehensive MRSA colonization surveillance program. It is not intended to diagnose MRSA infection nor to guide or monitor treatment for MRSA infections. Performed at [facility], [address] [place], [place] [address]          Radiology Studies: CT ABDOMEN PELVIS W CONTRAST  Result Date: 11/08/2019 CLINICAL DATA:  Percutaneous cholecystostomy for perforated cholecystitis EXAM: CT ABDOMEN AND PELVIS WITH CONTRAST TECHNIQUE: Multidetector CT imaging of the abdomen and pelvis was performed using the standard protocol following bolus administration of intravenous contrast. CONTRAST:  OMNIPAQUE IOHEXOL 300 MG/ML  SOLN COMPARISON:  11/05/2019, 11/08/2019 FINDINGS: Lower chest: Trace right pleural effusion. No acute lung disease. Extensive atherosclerosis of the coronary vessels unchanged. Hepatobiliary: Percutaneous cholecystostomy tube is seen within a decompressed gallbladder. Intrahepatic abscess seen on prior study has decreased in size, now measuring up to 3.9 x 3.9 cm in greatest dimension. Calcified gallstone again identified. There is extensive pericholecystic fat stranding. No intrahepatic biliary duct dilation. Remainder of the liver is unremarkable. Pancreas: Unremarkable. No pancreatic ductal dilatation or surrounding inflammatory changes. Spleen: Normal in size without focal abnormality. Adrenals/Urinary Tract: Stable renal cysts. No urinary tract calculi or obstruction. The bladder is unremarkable. The adrenals are normal. Stomach/Bowel: There is diffuse colonic diverticulosis without diverticulitis. No bowel obstruction or ileus. Normal appendix right lower quadrant. Vascular/Lymphatic: Aortic atherosclerosis. No  enlarged abdominal or pelvic lymph nodes. Reproductive:  Prostate is unremarkable. Other: Bilateral fat containing inguinal hernias are again noted. There is now a small amount of fluid within the right inguinal hernia. Trace free fluid within the right upper quadrant. No free intraperitoneal gas. Musculoskeletal: No acute or destructive bony lesions. Reconstructed images demonstrate no additional findings. IMPRESSION: 1. Percutaneous cholecystostomy tube, with interval decompression of the gallbladder. Persistent gallbladder wall thickening and pericholecystic fat stranding consistent with cholecystitis. 2. Decreased hepatic abscess. 3. Trace right pleural effusion. 4. Bilateral fat containing inguinal hernias, now with a small amount of hernia in the right inguinal sac. 5. Diverticulosis without diverticulitis. Electronically Signed   By: Randa Ngo M.D.   On: 11/08/2019 20:47   [place] EKG SITE RITE  Result Date: 11/08/2019 If Site Rite image not attached, placement could not be confirmed due to current cardiac rhythm.       Scheduled Meds: . amLODipine  10 mg Oral Daily  . aspirin EC  81 mg Oral Daily  . atorvastatin  40 mg Oral q1800  . Chlorhexidine Gluconate Cloth  6 each Topical Daily  . feeding supplement (ENSURE ENLIVE)  237 mL Oral TID BM  . heparin injection (subcutaneous)  5,000 Units Subcutaneous Q8H  . insulin aspart  0-15 Units Subcutaneous TID WC  . insulin detemir  10 Units Subcutaneous QHS  . multivitamin with minerals  1 tablet Oral Daily  . polyethylene glycol  17 g Oral Daily  . primidone  50 mg Oral QID  . senna-docusate  1 tablet Oral BID  . sodium chloride flush  10-40 mL Intracatheter Q12H  . sodium chloride flush  5 mL Intracatheter Q8H   Continuous Infusions: . sodium chloride Stopped (11/10/19 0503)  . sodium chloride 100 mL/hr at 11/10/19 1008  . piperacillin-tazobactam (ZOSYN)  IV 3.375 g (11/10/19 0503)    Assessment & Plan:   Active Problems:   Acute  on chronic cholecystitis   Abscess   Bacteremia   Gallbladder abscess s/p cholecystotomy and they drained 450 cc blood-tinged pus Cultures with Enterococcus and Streptococcus  continue on Unasyn for enterococcal/streptococcal bacteremia. Repeat CAT scan demonstrating enlarging pericholecystic hepatic abscess Interventional radiology reconsulted Status post drain exchange on 11/06/2019 Continue to follow drain output ID following-Continue Unasyn 3 g IV every 6 hours and will need for minimum 4 weeks, but now switched to zosyn. Repeat right upper quadrant ultrasound and stated it would be better to perform CT for direct comparison to the old CT findings Ct obtained.  Per surgery Dr. Hampton Abbot, CT appears abscess is smaller and can be discharged from surgical standpoint with follow-up with Dr. Rennie Plowman in 2 weeks.  Sepsis secondary to enterococcal and streptococcal bacteremia Blood culture on admission positive for Enterococcus and Streptococcus Patient remains on Unasyn, from minimum 4 weeks Surveillance blood cultures have been negative  PICC basement Trying to set up Zosyn 3.375 g IV every 6 hours with end date on 11/29/2019.  Will need CBC with differential, CMP, and pulling PICC at completion of IV antibiotics.  Is to follow-up with Dr. Steva Ready in 2 weeks.   DM2 SSI 0 to 15 units AC and at bedtime Metformin being held  AKI Creatinine much improved with treatment of infection and hydration  Hyponatremia Sodium normalized with hydration. DC IV fluids  DVT prophylaxis: heparin Code Status: FULL Family Communication:  None at bedside  disposition Plan: Anticipate home with home health.  Barrier: Home health cannot be at patient's house until Sunday for infusion.  In order not to miss  dosing of Zosyn he will be discharged on Sunday morning.       LOS: 10 days    Time spent: 45 min with >50% coc    Lynn Ito, MD [facility] Pager [phone]  If 7PM-7AM,  please contact night-coverage [email or web address] Password [facility] 11/10/2019, 12:34 PM Patient ID: Kyle Ritter, male   DOB: 08-27-1945, 74 y.o.

## 2019-11-11 DIAGNOSIS — L0291 Cutaneous abscess, unspecified: Secondary | ICD-10-CM | POA: Diagnosis not present

## 2019-11-11 DIAGNOSIS — K812 Acute cholecystitis with chronic cholecystitis: Secondary | ICD-10-CM | POA: Diagnosis not present

## 2019-11-11 DIAGNOSIS — R7881 Bacteremia: Secondary | ICD-10-CM | POA: Diagnosis not present

## 2019-11-11 DIAGNOSIS — N179 Acute kidney failure, unspecified: Secondary | ICD-10-CM | POA: Diagnosis not present

## 2019-11-11 LAB — GLUCOSE, CAPILLARY: Glucose-Capillary: 132 mg/dL — ABNORMAL HIGH (ref 70–99)

## 2019-11-11 LAB — BASIC METABOLIC PANEL
Anion gap: 8 (ref 5–15)
BUN: 14 mg/dL (ref 8–23)
CO2: 27 mmol/L (ref 22–32)
Calcium: 8.8 mg/dL — ABNORMAL LOW (ref 8.9–10.3)
Chloride: 98 mmol/L (ref 98–111)
Creatinine, Ser: 1.01 mg/dL (ref 0.61–1.24)
GFR calc Af Amer: >60 mL/min (ref >60)
GFR calc non Af Amer: >60 mL/min (ref >60)
Glucose, Bld: 181 mg/dL — ABNORMAL HIGH (ref 70–99)
Potassium: 4.2 mmol/L (ref 3.5–5.1)
Sodium: 133 mmol/L — ABNORMAL LOW (ref 135–145)

## 2019-11-11 MED ORDER — PIPERACILLIN-TAZOBACTAM IV (FOR PTA / DISCHARGE USE ONLY): 13.5000 g | INTRAVENOUS | 0 refills | Status: AC

## 2019-11-11 MED ORDER — ADULT MULTIVITAMIN W/MINERALS CH: 1.0000 | ORAL_TABLET | Freq: Every day | ORAL | 0 refills | Status: AC

## 2019-11-11 NOTE — Discharge Summary (Signed)
Kyle Ritter:[id number] DOB: 1946-05-12 DOA: 10/31/2019  PCP: Fayrene Helper, NP  Admit date: 10/31/2019 Discharge date: 11/11/2019  Admitted From: Home Disposition: Home  Recommendations for Outpatient Follow-up:  1. Follow up with PCP in 1 week 2. Please obtain BMP/CBC in one week   Home Health: Yes, RN and PT   Discharge Condition:Stable CODE STATUS: Full Diet recommendation:  Carb Modified  Brief/Interim Summary: Kyle Ritter is an 74 y.o. male is a pleasant Hispanic male with medical history significant for obesity, type 2 diabetes mellitus, history of brain aneurysm.  He presented to the emergency room this evening upon insistence of cyst on account of generalized weakness with near falls at home.  Patient was also reported to have been febrile at home raising concern for possible infection has EMS being called.    Admitted  to poor oral intake.  Work-up in the ED revealed evidence of leukocytosis and fever.  Abdominal CT scan confirmed findings suggestive of acute on chronic cholecystitis with an initial impression of possible hepatic abscess.  General surgery was consulted and recommended IR drainage of abscess with percutaneous cholecystectomy tube placement..  Blood cultures were positive for Streptococcus gallolyticus and Enterococcus faecium.  Infectious disease was consulted.  He was started on Unasyn .  He had a repeat CT scan demonstrating enlarging pericholecystic hepatic abscess and interventional radiology reconsulted.  He underwent status post drain exchange on 11/06/2019.  ID recommended for minimum of 4 weeks of IV antibiotics.  He had a repeat right upper quadrant ultrasound to reevaluate the abscess however had to repeat the CT scan.Per surgery Dr. Aleen Campi, CT appears abscess is smaller and can be discharged from surgical standpoint with follow-up with Dr. Birdena Jubilee in 2 weeks.   On admission he was septic secondary to enterococcal and streptococcal bacteremia.   Surveillance blood cultures were negative.  PICC was placed.  His IV antibiotics were switched from Unasyn to Zosyn with end date of 11/29/2019. Will need CBC with differential, CMP, and pulling PICC at completion of IV antibiotics.  Is to follow-up with Dr. Joylene Draft in 2 weeks.   He was also found with acute kidney injury due to dehydration/prerenal which improved with IV fluids.   Today he is stable to be discharged home.   Gallbladder abscess s/p cholecystotomy and they drained 450 cc blood-tinged pus Cultures with Enterococcus and Streptococcus  continue on Unasyn for enterococcal/streptococcal bacteremia. Repeat CAT scan demonstrating enlarging pericholecystic hepatic abscess Interventional radiology reconsulted Status post drain exchange on 11/06/2019 Continue to follow drain output ID following-Continue Unasyn 3 g IV every 6 hours and will need for minimum 4 weeks Repeat right upper quadrant ultrasound and stated it would be better to perform CT for direct comparison to the old CT findings Ct obtained.  Per surgery Dr. Aleen Campi, CT appears abscess is smaller and can be discharged from surgical standpoint with follow-up with Dr. Birdena Jubilee in 2 weeks.  Sepsis secondary to enterococcal and streptococcal bacteremia Blood culture on admission positive for Enterococcus and Streptococcus Patient remains on Unasyn, from minimum 4 weeks Surveillance blood cultures have been negative  PICC basement Trying to set up Zosyn 3.375 g IV every 6 hours with end date on 11/29/2019.  Will need CBC with differential, CMP, and pulling PICC at completion of IV antibiotics.  Is to follow-up with Dr. Joylene Draft in 2 weeks.   DM2 SSI 0 to 15 units AC and at bedtime Metformin being held  AKI Creatinine much improved with treatment of  infection and hydration  Hyponatremia Sodium normalized with hydration.  Discharge Diagnoses:  Active Problems:   Acute on chronic cholecystitis   Abscess    Bacteremia    Discharge Instructions  Discharge Instructions    Call MD for:  redness, tenderness, or signs of infection (pain, swelling, redness, odor or green/yellow discharge around incision site)   Complete by: As directed    Call MD for:  temperature >100.4   Complete by: As directed    Diet - low sodium heart healthy   Complete by: As directed    Discharge instructions   Complete by: As directed    Clinic Follow Up Appt: in 2 weeks with Dr. Avon Gully, infectious disease Call [phone] to make appt F/u with pcp in one week Lab work needs to be faxed to Dr. Avon Gully office.     Home infusion instructions   Complete by: As directed    Instructions: Flushing of vascular access device: 0.9% NaCl pre/post medication administration and prn patency; Heparin 100 u/ml, 5ml for implanted ports and Heparin 10u/ml, 5ml for all other central venous catheters.   Increase activity slowly   Complete by: As directed      Allergies as of 11/11/2019   No Known Allergies     Medication List    TAKE these medications   amLODipine 10 MG tablet Commonly known as: NORVASC Take 1 tablet (10 mg total) by mouth daily.   aspirin 81 MG EC tablet Take 1 tablet (81 mg total) by mouth daily.   atorvastatin 40 MG tablet Commonly known as: LIPITOR Take 1 tablet (40 mg total) by mouth daily at 6 PM.   metFORMIN 500 MG tablet Commonly known as: Glucophage Take 1 tablet (500 mg total) by mouth 2 (two) times daily with a meal.   multivitamin with minerals Tabs tablet Take 1 tablet by mouth daily.   nitroGLYCERIN 0.4 MG SL tablet Commonly known as: NITROSTAT Place 0.4 mg under the tongue as needed for chest pain.   piperacillin-tazobactam  IVPB Commonly known as: ZOSYN Inject 13.5 g into the vein continuous for 20 days. Infuse 13.5gm of piperacillin/tazobactam daily over 24h as continuous infusion Indication: Enterococcus fecalis and streptococcus gallolyticus bacteremia, gallbladder  abscess/perforation, liver abscess Last Day of Therapy: 5/6/20201 Labs - Once weekly on Mondays:  CBC/D and CMP   primidone 50 MG tablet Commonly known as: MYSOLINE Take 50 mg by mouth 4 (four) times daily. Take 2 tablets by mouth once daily and 2 tablets in the evening            Home Infusion Instuctions  (From admission, onward)         Start     Ordered   11/11/19 0000  Home infusion instructions    Question:  Instructions  Answer:  Flushing of vascular access device: 0.9% NaCl pre/post medication administration and prn patency; Heparin 100 u/ml, 5ml for implanted ports and Heparin 10u/ml, 5ml for all other central venous catheters.   11/11/19 0835           Durable Medical Equipment  (From admission, onward)         Start     Ordered   11/05/19 0750  For home use only DME Walker rolling  Once    Question Answer Comment  Walker: With 5 Inch Wheels   Patient needs a walker to treat with the following condition Weakness      11/05/19 0749         Follow-up  Information    [place], [place] F, MD. Schedule an appointment as soon as possible for a visit in 2 day(s).   Specialty: General Surgery Why: hospital follow up, cholecytitis with perc cholecystostomy tube Contact information: [address] [place] [address] [phone]        Fayrene Helper, NP Follow up in 1 week(s).   Specialty: Nurse Practitioner Contact information: [address] [place] [address] [phone]        Berdine Dance, MD Follow up in 1 week(s).   Specialties: Interventional Radiology, Radiology Contact information: [address] [place] [address] [phone]        Lynn Ito, MD Follow up in 2 week(s).   Specialty: Infectious Diseases Contact information: [address] [place] [address] [phone]          No Known Allergies  Consultations:  IR, general surgery, interventional  radiology   Procedures/Studies: CT ABDOMEN W CONTRAST  Result Date: 11/05/2019 CLINICAL DATA:  Right upper quadrant pain. Elevated white blood cell count. Fever. Re-evaluation of perforated cholecystitis, status post cholecystostomy. Cholecystostomy 4 days ago. EXAM: CT ABDOMEN WITH CONTRAST TECHNIQUE: Multidetector CT imaging of the abdomen was performed using the standard protocol following bolus administration of intravenous contrast. CONTRAST:  OMNIPAQUE IOHEXOL 300 MG/ML  SOLN COMPARISON:  10/31/2019 FINDINGS: Lower chest: Clear lung bases. Mild cardiomegaly with multivessel coronary artery atherosclerosis. Trace right greater than left pleural effusions are new since the prior CT. Hepatobiliary: No focal liver lesion. Interval cholecystostomy. Redemonstration of gallbladder distension she is minimally improved. At least 1 gallstone. Ill definition is of portions of the gallbladder wall, consistent with perforated cholecystitis, as before. Extension of gas and fluid in the segment 4 of the liver, including at 4.6 x 4.0 cm on 23/2. Compare 4.2 x 3.6 cm at the same level on the prior exam (when remeasured). Medial extension of presumed extraluminal fluid including on coronal image 41, similar. Residual pericholecystic edema, including inferiorly on 40/2. No intrahepatic biliary duct dilatation. Common duct is suboptimally evaluated along its pericholecystic course. No calcified common duct stone. Pancreas: Normal pancreas for age, without duct dilatation or acute inflammation. Spleen: Normal in size, without focal abnormality. Adrenals/Urinary Tract: Normal adrenal glands. Bilateral small low-density renal lesions are likely cysts. No hydronephrosis. Stomach/Bowel: Normal stomach, without wall thickening. Scattered colonic diverticula. Normal terminal ileum and appendix. Normal abdominal small bowel. Vascular/Lymphatic: Aortic atherosclerosis. Patent portal hepatic, splenic veins. No retroperitoneal  or retrocrural adenopathy. Other: No ascites.  No free intraperitoneal air. Musculoskeletal: Lower thoracic and upper lumbar spondylosis IMPRESSION: 1. Status post cholecystostomy with persistent findings of perforated cholecystitis. Slight increase in developing pericholecystic hepatic abscess or abscesses. 2. No biliary duct dilatation or convincing evidence of choledocholithiasis. 3. New tiny bilateral pleural effusions. 4.  Aortic Atherosclerosis (ICD10-I70.0). Electronically Signed   By: Jeronimo Greaves M.D.   On: 11/05/2019 15:04   CT ABDOMEN PELVIS W CONTRAST  Result Date: 11/08/2019 CLINICAL DATA:  Percutaneous cholecystostomy for perforated cholecystitis EXAM: CT ABDOMEN AND PELVIS WITH CONTRAST TECHNIQUE: Multidetector CT imaging of the abdomen and pelvis was performed using the standard protocol following bolus administration of intravenous contrast. CONTRAST:  OMNIPAQUE IOHEXOL 300 MG/ML  SOLN COMPARISON:  11/05/2019, 11/08/2019 FINDINGS: Lower chest: Trace right pleural effusion. No acute lung disease. Extensive atherosclerosis of the coronary vessels unchanged. Hepatobiliary: Percutaneous cholecystostomy tube is seen within a decompressed gallbladder. Intrahepatic abscess seen on prior study has decreased in size, now measuring up  to 3.9 x 3.9 cm in greatest dimension. Calcified gallstone again identified. There is extensive pericholecystic fat stranding. No intrahepatic biliary duct dilation. Remainder of the liver is unremarkable. Pancreas: Unremarkable. No pancreatic ductal dilatation or surrounding inflammatory changes. Spleen: Normal in size without focal abnormality. Adrenals/Urinary Tract: Stable renal cysts. No urinary tract calculi or obstruction. The bladder is unremarkable. The adrenals are normal. Stomach/Bowel: There is diffuse colonic diverticulosis without diverticulitis. No bowel obstruction or ileus. Normal appendix right lower quadrant. Vascular/Lymphatic: Aortic  atherosclerosis. No enlarged abdominal or pelvic lymph nodes. Reproductive: Prostate is unremarkable. Other: Bilateral fat containing inguinal hernias are again noted. There is now a small amount of fluid within the right inguinal hernia. Trace free fluid within the right upper quadrant. No free intraperitoneal gas. Musculoskeletal: No acute or destructive bony lesions. Reconstructed images demonstrate no additional findings. IMPRESSION: 1. Percutaneous cholecystostomy tube, with interval decompression of the gallbladder. Persistent gallbladder wall thickening and pericholecystic fat stranding consistent with cholecystitis. 2. Decreased hepatic abscess. 3. Trace right pleural effusion. 4. Bilateral fat containing inguinal hernias, now with a small amount of hernia in the right inguinal sac. 5. Diverticulosis without diverticulitis. Electronically Signed   By: Sharlet Salina M.D.   On: 11/08/2019 20:47   CT ABDOMEN PELVIS W CONTRAST  Result Date: 10/31/2019 CLINICAL DATA:  Right lower quadrant pain. Concern for acute appendicitis. EXAM: CT ABDOMEN AND PELVIS WITH CONTRAST TECHNIQUE: Multidetector CT imaging of the abdomen and pelvis was performed using the standard protocol following bolus administration of intravenous contrast. CONTRAST:  OMNIPAQUE IOHEXOL 300 MG/ML  SOLN COMPARISON:  None. FINDINGS: Lower chest: The lung bases are clear. The heart size is normal. Coronary artery calcifications are noted. Hepatobiliary: The liver is normal. There is massive dilatation of the gallbladder. The gallbladder wall appears thickened and is irregular. There is a probable perforation superiorly as evidence by extension into the adjacent hepatic parenchyma with pockets of gas. This collection measures approximately 4.6 x 2.3 cm (axial series 5, image 23). There appears to be a 1.8 cm stone near the gallbladder neck.There is some mild intrahepatic ductal dilatation. The common bile duct is difficult to fully evaluate  but does not appear to be significantly dilated. Pancreas: Normal contours without ductal dilatation. No peripancreatic fluid collection. Spleen: Unremarkable. Adrenals/Urinary Tract: --Adrenal glands: Unremarkable. --Right kidney/ureter: No hydronephrosis or radiopaque kidney stones. --Left kidney/ureter: No hydronephrosis or radiopaque kidney stones. --Urinary bladder: Unremarkable. Stomach/Bowel: --Stomach/Duodenum: No hiatal hernia or other gastric abnormality. Normal duodenal course and caliber. --Small bowel: Unremarkable. --Colon: Rectosigmoid diverticulosis without acute inflammation. --Appendix: Normal. Vascular/Lymphatic: Atherosclerotic calcification is present within the non-aneurysmal abdominal aorta, without hemodynamically significant stenosis. --No retroperitoneal lymphadenopathy. --No mesenteric lymphadenopathy. --No pelvic or inguinal lymphadenopathy. Reproductive: Unremarkable Other: No ascites or free air. Bilateral fat containing inguinal hernias are noted. Musculoskeletal. No acute displaced fractures. IMPRESSION: 1. Massive dilatation of the gallbladder with findings most consistent with perforated cholecystitis. There appears to be a developing intrahepatic abscess as detailed above. 2. Rectosigmoid diverticulosis without acute inflammation. 3. Normal appendix in the right lower quadrant. 4. Aortic Atherosclerosis (ICD10-I70.0). Electronically Signed   By: Katherine Mantle M.D.   On: 10/31/2019 22:06   DG Chest Port 1 View  Result Date: 10/31/2019 CLINICAL DATA:  Sepsis EXAM: PORTABLE CHEST 1 VIEW COMPARISON:  09/22/2015 FINDINGS: Low lung volumes. No confluent opacities or effusions. Heart is normal size. No acute bony abnormality. IMPRESSION: Low lung volumes.  No acute cardiopulmonary disease. Electronically Signed   By: Charlett Nose  M.D.   On: 10/31/2019 19:11   ECHOCARDIOGRAM COMPLETE  Result Date: 11/03/2019    ECHOCARDIOGRAM REPORT   Patient Name:   JAIDON ELLERY Date of Exam:  11/03/2019 Medical Rec #:  [id number]     Height:       71.5 in Accession #:    [id number]    Weight:       181.9 lb Date of Birth:  08/16/1945     BSA:          2.036 m Patient Age:    73 years      BP:           146/72 mmHg Patient Gender: M             HR:           74 bpm. Exam Location:  [facility] Procedure: 2D Echo Indications:     BACTEREMIA 790.7/R78.81  History:         Patient has prior history of Echocardiogram examinations, most                  recent 09/22/2015. Risk Factors:Hypertension and Diabetes.  Sonographer:     Johnathan Hausen Referring Phys:  [id number] Lynn Ito Diagnosing Phys: Debbe Odea MD IMPRESSIONS  1. Left ventricular ejection fraction, by estimation, is 55 to 60%. The left ventricle has normal function. The left ventricle has no regional wall motion abnormalities. Left ventricular diastolic parameters are consistent with Grade II diastolic dysfunction (pseudonormalization).  2. Right ventricular systolic function is normal. The right ventricular size is mildly enlarged.  3. Left atrial size was mildly dilated.  4. The mitral valve is grossly normal. No evidence of mitral valve regurgitation.  5. The aortic valve is grossly normal. Aortic valve regurgitation is not visualized.  6. The inferior vena cava is dilated in size with >50% respiratory variability, suggesting right atrial pressure of 8 mmHg. Conclusion(s)/Recommendation(s): No evidence of valvular vegetations on this transthoracic echocardiogram. Would recommend a transesophageal echocardiogram to exclude infective endocarditis if clinically indicated. FINDINGS  Left Ventricle: Left ventricular ejection fraction, by estimation, is 55 to 60%. The left ventricle has normal function. The left ventricle has no regional wall motion abnormalities. The left ventricular internal cavity size was normal in size. There is  no left ventricular hypertrophy. Left ventricular diastolic parameters are consistent with Grade II diastolic  dysfunction (pseudonormalization). Right Ventricle: The right ventricular size is mildly enlarged. Right vetricular wall thickness was not assessed. Right ventricular systolic function is normal. Left Atrium: Left atrial size was mildly dilated. Right Atrium: Right atrial size was normal in size. Pericardium: There is no evidence of pericardial effusion. Mitral Valve: The mitral valve is grossly normal. No evidence of mitral valve regurgitation. Tricuspid Valve: The tricuspid valve is grossly normal. Tricuspid valve regurgitation is not demonstrated. Aortic Valve: The aortic valve is grossly normal. Aortic valve regurgitation is not visualized. Aortic valve mean gradient measures 10.0 mmHg. Aortic valve peak gradient measures 18.3 mmHg. Aortic valve area, by VTI measures 2.01 cm. Pulmonic Valve: The pulmonic valve was not well visualized. Pulmonic valve regurgitation is not visualized. Aorta: The aortic root is normal in size and structure. Venous: The inferior vena cava is dilated in size with greater than 50% respiratory variability, suggesting right atrial pressure of 8 mmHg. IAS/Shunts: No atrial level shunt detected by color flow Doppler.  LEFT VENTRICLE PLAX 2D LVIDd:         4.03 cm  Diastology LVIDs:  2.89 cm  LV e' lateral:   12.80 cm/s LV PW:         1.04 cm  LV E/e' lateral: 9.4 LV IVS:        0.84 cm  LV e' medial:    6.42 cm/s LVOT diam:     2.00 cm  LV E/e' medial:  18.7 LV SV:         87 LV SV Index:   43 LVOT Area:     3.14 cm  RIGHT VENTRICLE RV Mid diam:    5.17 cm LEFT ATRIUM           Index LA diam:      5.60 cm 2.75 cm/m LA Vol (A4C): 80.2 ml 39.40 ml/m  AORTIC VALVE AV Area (Vmax):    1.86 cm AV Area (Vmean):   1.78 cm AV Area (VTI):     2.01 cm AV Vmax:           214.00 cm/s AV Vmean:          150.000 cm/s AV VTI:            0.431 m AV Peak Grad:      18.3 mmHg AV Mean Grad:      10.0 mmHg LVOT Vmax:         127.00 cm/s LVOT Vmean:        85.200 cm/s LVOT VTI:          0.276 m  LVOT/AV VTI ratio: 0.64  AORTA Ao Root diam: 4.70 cm MITRAL VALVE MV Area (PHT): 3.48 cm     SHUNTS MV Decel Time: 218 msec     Systemic VTI:  0.28 m MV E velocity: 120.00 cm/s  Systemic Diam: 2.00 cm MV A velocity: 143.00 cm/s MV E/A ratio:  0.84 Debbe Odea MD Electronically signed by Debbe Odea MD Signature Date/Time: 11/03/2019/3:47:27 PM    Final    CT IMAGE GUIDED DRAINAGE BY PERCUTANEOUS CATHETER  Result Date: 11/06/2019 INDICATION: Poorly draining percutaneous cholecystostomy tube. EXAM: SCRATCH CT GUIDED DRAINAGE OF GALLBLADDER MEDICATIONS: The patient is currently admitted to the hospital and receiving intravenous antibiotics. The antibiotics were administered within an appropriate time frame prior to the initiation of the procedure. ANESTHESIA/SEDATION: None. COMPLICATIONS: None immediate. TECHNIQUE: After discussing the risks and benefits of this procedure with the patient informed consent was obtained. Right upper quadrant was sterilely prepped and draped. Following local anesthesia 1% lidocaine the existing small percutaneous cholecystostomy tube was exchanged over an Amplatz extra stiff wire for a 12 French drainage catheter. 150 cc of thick pus removed. Catheter was sutured in place. There no complications. PROCEDURE: As above. FINDINGS: Successful up sizing of existing percutaneous cholecystostomy tube to a 12 French drainage tube. 150 cc of thick pus removed. No complications. Patient remained stable condition following the procedure. IMPRESSION: Successful up sizing of existing percutaneous cholecystostomy tube to a 12 French drainage tube. 150 cc of thick pus removed. Electronically Signed   By: Maisie Fus  Register   On: 11/06/2019 08:53   CT IMAGE GUIDED DRAINAGE BY PERCUTANEOUS CATHETER  Result Date: 11/01/2019 INDICATION: Perforated calculus cholecystitis EXAM: CT GUIDED 10 FRENCH CHOLECYSTOSTOMY Date:  11/01/2019 11/01/2019 3:27 pm Radiologist:  Judie Petit. Ruel Favors, MD Guidance:  CT  FLUOROSCOPY TIME:  None. MEDICATIONS: 1% lidocaine local ANESTHESIA/SEDATION: Moderate (conscious) sedation was employed during this procedure. A total of Versed 2.0 mg and Fentanyl 100 mcg was administered intravenously. Moderate Sedation Time: 12 minutes. The patient's level of consciousness and  vital signs were monitored continuously by radiology nursing throughout the procedure under my direct supervision. CONTRAST:  None. COMPLICATIONS: None immediate. PROCEDURE: Informed consent was obtained from the patient following explanation of the procedure, risks, benefits and alternatives. The patient understands, agrees and consents for the procedure. All questions were addressed. A time out was performed. Maximal barrier sterile technique utilized including caps, mask, sterile gowns, sterile gloves, large sterile drape, hand hygiene, and ChloraPrep. Previous imaging reviewed. Patient positioned supine. Noncontrast localization CT performed. The perforated distended gallbladder/abscess was localized. Overlying skin marked for a lower intercostal approach. Under sterile conditions and local anesthesia, an 18 gauge 10 cm access was advanced percutaneously via a transhepatic window into the collection. Needle position confirmed with CT. Syringe aspiration yielded purulent fluid. Sample sent for culture. Guidewire inserted followed by tract dilatation insert a 10 French drain. Drain catheter position confirmed with CT. Syringe aspiration yielded 400 cc bloody exudative fluid. Catheter secured with a prolene suture and connected to external gravity drainage bag. Sterile dressing applied. No immediate complication. Patient tolerated the procedure well. IMPRESSION: Successful CT-guided cholecystostomy as above. Electronically Signed   By: Jerilynn Mages.  Shick M.D.   On: 11/01/2019 15:43   [place] EKG SITE RITE  Result Date: 11/08/2019 If Site Rite image not attached, placement could not be confirmed due to current cardiac rhythm.  US  Abdomen Limited RUQ  Result Date: 11/08/2019 CLINICAL DATA:  Cholecystitis, abscess, follow-up EXAM: ULTRASOUND ABDOMEN LIMITED RIGHT UPPER QUADRANT COMPARISON:  Ultrasound 10/31/2019, CT 11/05/2019 FINDINGS: Gallbladder: Cholecystostomy tube in place. Markedly abnormal gallbladder with gallbladder wall thickening measuring 7 mm. 2 cm stone is noted within the gallbladder along with complex fluid/debris/sludge. Common bile duct: Diameter: Normal caliber, 3 mm Liver: Increased echotexture compatible with fatty infiltration. No focal abnormality or biliary ductal dilatation. Portal vein is patent on color Doppler imaging with normal direction of blood flow towards the liver. Other: None. IMPRESSION: Complex, markedly abnormal appearance of the gallbladder with marked gallbladder wall thickening, extensive debris/sludge and stones. Pericholecystic fluid present. Evaluation of the perforated cholecystitis and abscess would be better performed with CT for direct comparison to old CT. Electronically Signed   By: Rolm Baptise M.D.   On: 11/08/2019 10:48   US ABDOMEN LIMITED RUQ  Result Date: 10/31/2019 CLINICAL DATA:  Elevated LFTs. EXAM: ULTRASOUND ABDOMEN LIMITED RIGHT UPPER QUADRANT COMPARISON:  None. FINDINGS: Gallbladder: Abnormal, markedly distended with large amount of heterogeneous internal echogenic debris likely combination of stones and sludge. No evidence of internal vascularity. Diffuse gallbladder wall thickening measuring up to 7 mm. Small amount of pericholecystic fluid. No sonographic Murphy sign noted by sonographer. Common bile duct: Diameter: Not well-defined, tentatively visualized at 2 mm. Liver: Heterogeneously increased echogenicity. No evidence of focal lesion. No definite capsular nodularity. Portal vein is patent on color Doppler imaging with normal direction of blood flow towards the liver. Other: None. IMPRESSION: 1. Abnormal appearance of the gallbladder which is markedly  distended/hydropic and filled with heterogeneous echogenic debris. This may represent combination of stones and sludge. Diffuse gallbladder wall thickening. 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 No internal vascularity or hyperemia by ultrasound. Recommend further evaluation with cross-sectional imaging. MRCP would be the imaging modality of choice if patient is able to tolerate breath hold technique. 2. Heterogeneous increased hepatic echogenicity most consistent with steatosis. 3. No common bile duct dilatation. Electronically Signed   By: Keith Rake M.D.   On: 10/31/2019 20:59       Subjective: Has no complaints.  Discharge Exam: Vitals:   11/10/19 1941 11/11/19 0424  BP: 138/72 139/63  Pulse: 68 65  Resp: 16 16  Temp: 98.1 F (36.7 C) 97.7 F (36.5 C)  SpO2: 98% 96%   Vitals:   11/10/19 0422 11/10/19 1221 11/10/19 1941 11/11/19 0424  BP: (!) 163/64 (!) 146/59 138/72 139/63  Pulse: 69 62 68 65  Resp: 16 14 16 16   Temp: 98.1 F (36.7 C) 98 F (36.7 C) 98.1 F (36.7 C) 97.7 F (36.5 C)  TempSrc: Oral Oral Oral Oral  SpO2: 97% 97% 98% 96%  Weight:      Height:        General: Pt is alert, awake, not in acute distress Cardiovascular: RRR, S1/S2 +, no rubs, no gallops Respiratory: CTA bilaterally, no wheezing, no rhonchi Abdominal: Soft, NT, ND, bowel sounds + right quadrant drainage tube in place. Extremities: no edema, no cyanosis    The results of significant diagnostics from this hospitalization (including imaging, microbiology, ancillary and laboratory) are listed below for reference.     Microbiology: Recent Results (from the past 240 hour(s))  Aerobic/Anaerobic Culture (surgical/deep wound)     Status: None   Collection Time: 11/01/19  3:23 PM   Specimen: Abdomen; Abscess  Result Value Ref Range Status   Specimen Description   Final    ABDOMEN Performed at [facility], [address] [place], [place] [address]    Special  Requests   Final    Normal Performed at [facility], [address] [place], [place] [address]    Gram Stain   Final    ABUNDANT WBC PRESENT, PREDOMINANTLY PMN MODERATE GRAM POSITIVE COCCI IN PAIRS IN CLUSTERS RARE GRAM POSITIVE RODS    Culture   Final    FEW KLEBSIELLA PNEUMONIAE MODERATE STREPTOCOCCUS GALLOLYTICUS MODERATE ENTEROCOCCUS FAECIUM NO ANAEROBES ISOLATED Performed at [facility], [address], [place], [place] [address]    Report Status 11/10/2019 FINAL  Final   Organism ID, Bacteria STREPTOCOCCUS GALLOLYTICUS  Final   Organism ID, Bacteria ENTEROCOCCUS FAECIUM  Final   Organism ID, Bacteria KLEBSIELLA PNEUMONIAE  Final      Susceptibility   Enterococcus faecium - MIC*    AMPICILLIN <=2 SENSITIVE Sensitive     VANCOMYCIN <=0.5 SENSITIVE Sensitive     GENTAMICIN SYNERGY SENSITIVE Sensitive     * MODERATE ENTEROCOCCUS FAECIUM   Klebsiella pneumoniae - MIC*    AMPICILLIN >=32 RESISTANT Resistant     CEFAZOLIN <=4 SENSITIVE Sensitive     CEFEPIME <=1 SENSITIVE Sensitive     CEFTAZIDIME <=1 SENSITIVE Sensitive     CEFTRIAXONE <=1 SENSITIVE Sensitive     CIPROFLOXACIN <=0.25 SENSITIVE Sensitive     GENTAMICIN <=1 SENSITIVE Sensitive     IMIPENEM 0.5 SENSITIVE Sensitive     TRIMETH/SULFA <=20 SENSITIVE Sensitive     AMPICILLIN/SULBACTAM 8 SENSITIVE Sensitive     PIP/TAZO <=4 SENSITIVE Sensitive     * FEW KLEBSIELLA PNEUMONIAE   Streptococcus gallolyticus - MIC*    PENICILLIN 0.12 SENSITIVE Sensitive     CEFTRIAXONE <=0.12 SENSITIVE Sensitive     ERYTHROMYCIN >=8 RESISTANT Resistant     LEVOFLOXACIN 2 SENSITIVE Sensitive     VANCOMYCIN <=0.12 SENSITIVE Sensitive     * MODERATE STREPTOCOCCUS GALLOLYTICUS  Culture, blood (Routine X 2) w Reflex to ID Panel     Status: None   Collection Time: 11/02/19  1:11 AM   Specimen: BLOOD  Result Value Ref Range Status  Specimen Description BLOOD BLOOD RIGHT HAND  Final   Special Requests   Final     BOTTLES DRAWN AEROBIC AND ANAEROBIC Blood Culture adequate volume   Culture   Final    NO GROWTH 5 DAYS Performed at [facility], [address] [place], [place] [address]    Report Status 11/07/2019 FINAL  Final  Culture, blood (Routine X 2) w Reflex to ID Panel     Status: None   Collection Time: 11/02/19  1:17 AM   Specimen: BLOOD  Result Value Ref Range Status   Specimen Description BLOOD BLOOD LEFT FOREARM  Final   Special Requests   Final    BOTTLES DRAWN AEROBIC AND ANAEROBIC Blood Culture adequate volume   Culture   Final    NO GROWTH 5 DAYS Performed at [facility], [address], [place], [place] [address]    Report Status 11/07/2019 FINAL  Final  MRSA PCR Screening     Status: None   Collection Time: 11/02/19 12:53 PM   Specimen: Nasopharyngeal  Result Value Ref Range Status   MRSA by PCR NEGATIVE NEGATIVE Final    Comment:        The GeneXpert MRSA Assay (FDA approved for NASAL specimens only), is one component of a comprehensive MRSA colonization surveillance program. It is not intended to diagnose MRSA infection nor to guide or monitor treatment for MRSA infections. Performed at [facility], [address] [place], [place] [address]      Labs: BNP (last 3 results) No results for input(s): BNP in the last 8760 hours. Basic Metabolic Panel: Recent Labs  Lab 11/07/19 0558 11/08/19 0428 11/09/19 0529 11/10/19 0724 11/11/19 0332  NA 138 138 139 136 133*  K 4.1 3.6 4.2 4.5 4.2  CL 100 102 102 99 98  CO2 30 28 32 29 27  GLUCOSE 125* 151* 163* 149* 181*  BUN 11 12 11 11 14   CREATININE 0.91 0.92 0.95 0.92 1.01  CALCIUM 8.5* 8.9 8.8* 9.0 8.8*   Liver Function Tests: No results for input(s): AST, ALT, ALKPHOS, BILITOT, PROT, ALBUMIN in the last 168 hours. No results for input(s): LIPASE, AMYLASE in the last 168 hours. No results for input(s): AMMONIA in the last 168 hours. CBC: Recent Labs  Lab 11/07/19 0558  11/08/19 0428 11/09/19 0529  WBC 12.0* 10.0 9.8  NEUTROABS 9.7* 7.4 7.3  HGB 9.8* 10.0* 9.3*  HCT 29.4* 30.3* 29.3*  MCV 87.0 87.1 89.9  PLT 363 354 350   Cardiac Enzymes: No results for input(s): CKTOTAL, CKMB, CKMBINDEX, TROPONINI in the last 168 hours. BNP: Invalid input(s): POCBNP CBG: Recent Labs  Lab 11/10/19 0732 11/10/19 1219 11/10/19 1642 11/10/19 2101 11/11/19 0731  GLUCAP 115* 229* 229* 203* 132*   D-Dimer No results for input(s): DDIMER in the last 72 hours. Hgb A1c No results for input(s): HGBA1C in the last 72 hours. Lipid Profile No results for input(s): CHOL, HDL, LDLCALC, TRIG, CHOLHDL, LDLDIRECT in the last 72 hours. Thyroid function studies No results for input(s): TSH, T4TOTAL, T3FREE, THYROIDAB in the last 72 hours.  Invalid input(s): FREET3 Anemia work up No results for input(s): VITAMINB12, FOLATE, FERRITIN, TIBC, IRON, RETICCTPCT in the last 72 hours. Urinalysis    Component Value Date/Time   COLORURINE YELLOW (A) 10/31/2019 1955   APPEARANCEUR CLEAR (A) 10/31/2019 1955   LABSPEC 1.018 10/31/2019 1955   PHURINE 7.0 10/31/2019 1955   GLUCOSEU >=500 (A) 10/31/2019 1955   HGBUR NEGATIVE 10/31/2019 1955  BILIRUBINUR NEGATIVE 10/31/2019 1955   KETONESUR 5 (A) 10/31/2019 1955   PROTEINUR 100 (A) 10/31/2019 1955   NITRITE NEGATIVE 10/31/2019 1955   LEUKOCYTESUR NEGATIVE 10/31/2019 1955   Sepsis Labs Invalid input(s): PROCALCITONIN,  WBC,  LACTICIDVEN Microbiology Recent Results (from the past 240 hour(s))  Aerobic/Anaerobic Culture (surgical/deep wound)     Status: None   Collection Time: 11/01/19  3:23 PM   Specimen: Abdomen; Abscess  Result Value Ref Range Status   Specimen Description   Final    ABDOMEN Performed at [facility], [address], [place], [place] [address]    Special Requests   Final    Normal Performed at [facility], [address] [place], [place] [address]    Gram Stain   Final     ABUNDANT WBC PRESENT, PREDOMINANTLY PMN MODERATE GRAM POSITIVE COCCI IN PAIRS IN CLUSTERS RARE GRAM POSITIVE RODS    Culture   Final    FEW KLEBSIELLA PNEUMONIAE MODERATE STREPTOCOCCUS GALLOLYTICUS MODERATE ENTEROCOCCUS FAECIUM NO ANAEROBES ISOLATED Performed at [facility], [address], [place], [place] [address]    Report Status 11/10/2019 FINAL  Final   Organism ID, Bacteria STREPTOCOCCUS GALLOLYTICUS  Final   Organism ID, Bacteria ENTEROCOCCUS FAECIUM  Final   Organism ID, Bacteria KLEBSIELLA PNEUMONIAE  Final      Susceptibility   Enterococcus faecium - MIC*    AMPICILLIN <=2 SENSITIVE Sensitive     VANCOMYCIN <=0.5 SENSITIVE Sensitive     GENTAMICIN SYNERGY SENSITIVE Sensitive     * MODERATE ENTEROCOCCUS FAECIUM   Klebsiella pneumoniae - MIC*    AMPICILLIN >=32 RESISTANT Resistant     CEFAZOLIN <=4 SENSITIVE Sensitive     CEFEPIME <=1 SENSITIVE Sensitive     CEFTAZIDIME <=1 SENSITIVE Sensitive     CEFTRIAXONE <=1 SENSITIVE Sensitive     CIPROFLOXACIN <=0.25 SENSITIVE Sensitive     GENTAMICIN <=1 SENSITIVE Sensitive     IMIPENEM 0.5 SENSITIVE Sensitive     TRIMETH/SULFA <=20 SENSITIVE Sensitive     AMPICILLIN/SULBACTAM 8 SENSITIVE Sensitive     PIP/TAZO <=4 SENSITIVE Sensitive     * FEW KLEBSIELLA PNEUMONIAE   Streptococcus gallolyticus - MIC*    PENICILLIN 0.12 SENSITIVE Sensitive     CEFTRIAXONE <=0.12 SENSITIVE Sensitive     ERYTHROMYCIN >=8 RESISTANT Resistant     LEVOFLOXACIN 2 SENSITIVE Sensitive     VANCOMYCIN <=0.12 SENSITIVE Sensitive     * MODERATE STREPTOCOCCUS GALLOLYTICUS  Culture, blood (Routine X 2) w Reflex to ID Panel     Status: None   Collection Time: 11/02/19  1:11 AM   Specimen: BLOOD  Result Value Ref Range Status   Specimen Description BLOOD BLOOD RIGHT HAND  Final   Special Requests   Final    BOTTLES DRAWN AEROBIC AND ANAEROBIC Blood Culture adequate volume   Culture   Final    NO GROWTH 5 DAYS Performed at [facility], [address] [place], [place] [address]    Report Status 11/07/2019 FINAL  Final  Culture, blood (Routine X 2) w Reflex to ID Panel     Status: None   Collection Time: 11/02/19  1:17 AM   Specimen: BLOOD  Result Value Ref Range Status   Specimen Description BLOOD BLOOD LEFT FOREARM  Final   Special Requests   Final    BOTTLES DRAWN AEROBIC AND ANAEROBIC Blood Culture adequate volume   Culture   Final    NO GROWTH 5 DAYS Performed  at [facility], [address] [place], [place] [address]    Report Status 11/07/2019 FINAL  Final  MRSA PCR Screening     Status: None   Collection Time: 11/02/19 12:53 PM   Specimen: Nasopharyngeal  Result Value Ref Range Status   MRSA by PCR NEGATIVE NEGATIVE Final    Comment:        The GeneXpert MRSA Assay (FDA approved for NASAL specimens only), is one component of a comprehensive MRSA colonization surveillance program. It is not intended to diagnose MRSA infection nor to guide or monitor treatment for MRSA infections. Performed at [facility], [address] [place], [place] [address]      Time coordinating discharge: Over 30 minutes  SIGNED:   Lynn ItoSahar Bentlee [place], MD  [facility] 11/11/2019, 11:50 AM Pager   If 7PM-7AM, please contact night-coverage [email or web address] Password [email or web address]

## 2019-11-11 NOTE — TOC Progression Note (Signed)
Transition of Care [facility]) - Progression Note    Patient Details  Name: Kyle Ritter MRN: [id number] Date of Birth: February 01, 1946  [facility] CM/SW Contact  Kyle Ritter [place], [place] Phone Number: 11/11/2019, 11:31 AM  Clinical Narrative:    Patient to discharge home with [facility]. [facility] nurse to meet patient and patient's sister at the home today at 12pm for teaching. [facility] will be supplying the medications and supplies. Phone call to [place] from [place] [phone]  to inform her of patient's discharge and medication needs. Kyle Ritter is working with their pharmacist to ensure that his medication is delivered as soon as possible.  Kyle Creppel, LCSW Clinical Social Work [phone]    Expected Discharge Plan: Home w Home Health Services Barriers to Discharge: Continued Medical Work up  Expected Discharge Plan and Services Expected Discharge Plan: Home w Home Health Services     Post Acute Care Choice: Home Health Living arrangements for the past 2 months: Mobile Home Expected Discharge Date: 11/11/19                                     Social Determinants of Health (SDOH) Interventions    Readmission Risk Interventions No flowsheet data found.

## 2019-11-11 NOTE — Progress Notes (Signed)
Pt discharged per MD order. Discharge instructions reviewed with pt and his sister. Prescription given to pt. Drain care demonstrated to both pt and sister and they verbalized understanding. Pt taken to car in wheelchair by staff.

## 2019-11-12 ENCOUNTER — Other Ambulatory Visit: Payer: Self-pay | Admitting: Surgery

## 2019-11-12 ENCOUNTER — Other Ambulatory Visit: Payer: Self-pay | Admitting: Radiology

## 2019-11-12 DIAGNOSIS — K810 Acute cholecystitis: Secondary | ICD-10-CM

## 2019-11-12 NOTE — NC FL2 (Addendum)
[place] LEVEL OF CARE SCREENING TOOL     IDENTIFICATION  Patient Name: Kyle Ritter Birthdate: 15-Oct-1945 Sex: male Admission Date (Current Location): 10/31/2019  [facility] [place] Number:  Engineering geologist and Address:         Provider Number: [id number]  Attending Physician Name and Address:  No att. providers found  Relative Name and Phone Number:       Current Level of Care: Hospital Recommended Level of Care: [place] Prior Approval Number:    Date Approved/Denied:   PASRR Number: [id number]  Discharge Plan: SNF    Current Diagnoses: Patient Active Problem List   Diagnosis Date Noted  . Abscess   . Bacteremia   . Acute on chronic cholecystitis 10/31/2019  . Elevated troponin 09/22/2015    Orientation RESPIRATION BLADDER Height & Weight     Self, Time, Situation, Place  Normal Continent Weight: 178 lb 3.2 oz (80.8 kg) Height:  5' 11.5" (181.6 cm)  BEHAVIORAL SYMPTOMS/MOOD NEUROLOGICAL BOWEL NUTRITION STATUS  (None) (None) Continent Diet(Carb modified)  AMBULATORY STATUS COMMUNICATION OF NEEDS Skin   Limited Assist Verbally Other (Comment), Bruising, Surgical wounds(Blister, Catheter entry/exit, Excoriated.)                       Personal Care Assistance Level of Assistance              Functional Limitations Info  Sight, Hearing, Speech Sight Info: Adequate Hearing Info: Adequate Speech Info: Adequate    SPECIAL CARE FACTORS FREQUENCY  PT (By licensed PT)     PT Frequency: 5 x week              Contractures Contractures Info: Not present    Additional Factors Info  Code Status, Allergies Code Status Info: Full code Allergies Info: NKDA           Current Medications (11/12/2019):  This is the current hospital active medication list No current facility-administered medications for this encounter.   Current Outpatient Medications  Medication Sig Dispense Refill  . amLODipine  (NORVASC) 10 MG tablet Take 1 tablet (10 mg total) by mouth daily. 30 tablet 0  . atorvastatin (LIPITOR) 40 MG tablet Take 1 tablet (40 mg total) by mouth daily at 6 PM. 30 tablet 0  . metFORMIN (GLUCOPHAGE) 500 MG tablet Take 1 tablet (500 mg total) by mouth 2 (two) times daily with a meal. 60 tablet 0  . nitroGLYCERIN (NITROSTAT) 0.4 MG SL tablet Place 0.4 mg under the tongue as needed for chest pain.    . primidone (MYSOLINE) 50 MG tablet Take 50 mg by mouth 4 (four) times daily. Take 2 tablets by mouth once daily and 2 tablets in the evening    . aspirin EC 81 MG EC tablet Take 1 tablet (81 mg total) by mouth daily. 30 tablet 0  . Multiple Vitamin (MULTIVITAMIN WITH MINERALS) TABS tablet Take 1 tablet by mouth daily. 30 tablet 0  . piperacillin-tazobactam (ZOSYN) IVPB Inject 13.5 g into the vein continuous for 20 days. 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  Relevant Imaging Results:  Relevant Lab Results:   Additional Information SS#: [id number]. Has a  cholecystostomy tube. Needs IV Zosyn until 5/6.  Margarito Liner, LCSW

## 2019-11-14 ENCOUNTER — Telehealth: Payer: Self-pay

## 2019-11-14 NOTE — Telephone Encounter (Signed)
Call from [facility] was [phone]

## 2019-11-14 NOTE — Telephone Encounter (Signed)
Advised HH we do not sign off on social worker orders for SNF and they will contact PCP.

## 2019-11-14 NOTE — Telephone Encounter (Signed)
Gave  Verbal to [facility] to do labs every Monday and maintain PICC and meds. Patient notes state needs F/U in 2 weeks with ID. Lesley at [facility] will reach out to nurses to make sure they get him set up with [place]. I called but there is no service on phone listed. Will await call for F/U .

## 2019-11-21 ENCOUNTER — Telehealth: Payer: Self-pay

## 2019-11-21 NOTE — Telephone Encounter (Signed)
Spoke to Brier at [facility] who  Stated there are phone issues with patient. She will advise them to call us to schedule follow up when she sees them Monday and if no phones they will call while nurse is there.

## 2019-11-27 ENCOUNTER — Encounter: Payer: Self-pay | Admitting: Infectious Diseases

## 2019-11-27 ENCOUNTER — Other Ambulatory Visit: Payer: Self-pay

## 2019-11-27 ENCOUNTER — Ambulatory Visit: Payer: 59

## 2019-11-27 ENCOUNTER — Ambulatory Visit: Admission: RE | Admit: 2019-11-27 | Payer: 59 | Source: Ambulatory Visit

## 2019-11-27 ENCOUNTER — Ambulatory Visit: Payer: 59 | Attending: Infectious Diseases | Admitting: Infectious Diseases

## 2019-11-27 VITALS — BP 175/81 | HR 73 | Temp 98.3°F | Resp 16 | Ht 71.0 in | Wt 178.0 lb

## 2019-11-27 DIAGNOSIS — K8001 Calculus of gallbladder with acute cholecystitis with obstruction: Secondary | ICD-10-CM

## 2019-11-27 DIAGNOSIS — Z9049 Acquired absence of other specified parts of digestive tract: Secondary | ICD-10-CM | POA: Diagnosis not present

## 2019-11-27 DIAGNOSIS — Z95828 Presence of other vascular implants and grafts: Secondary | ICD-10-CM | POA: Insufficient documentation

## 2019-11-27 DIAGNOSIS — Z79899 Other long term (current) drug therapy: Secondary | ICD-10-CM | POA: Insufficient documentation

## 2019-11-27 DIAGNOSIS — K819 Cholecystitis, unspecified: Secondary | ICD-10-CM

## 2019-11-27 DIAGNOSIS — K750 Abscess of liver: Secondary | ICD-10-CM

## 2019-11-27 DIAGNOSIS — Z7982 Long term (current) use of aspirin: Secondary | ICD-10-CM | POA: Insufficient documentation

## 2019-11-27 DIAGNOSIS — K82A2 Perforation of gallbladder in cholecystitis: Secondary | ICD-10-CM | POA: Insufficient documentation

## 2019-11-27 DIAGNOSIS — Z978 Presence of other specified devices: Secondary | ICD-10-CM | POA: Insufficient documentation

## 2019-11-27 DIAGNOSIS — R7881 Bacteremia: Secondary | ICD-10-CM

## 2019-11-27 DIAGNOSIS — K812 Acute cholecystitis with chronic cholecystitis: Secondary | ICD-10-CM | POA: Diagnosis not present

## 2019-11-27 NOTE — Patient Instructions (Addendum)
You are here for follow up of the liver /gall bladder abscess You will finish IV antibiotic on 5/6 and your PICC can be removed on 11/30/19 You have an appt  to see the surgeon on 11/29/19  Thursday at 2pm with Dr.Rodenberg Address [address], [address] He will discuss regarding further management of the tube. After you complete IV you will take oral Amoxicillin/clavulunate 1 tablet twice a day for 2 weeks .

## 2019-11-27 NOTE — Progress Notes (Signed)
NAME: Kyle Ritter  DOB: June 22, 1946  MRN: [id number]  Date/Time: 11/27/2019 11:02 AM   Subjective:   ?Follow-up after recent discharge from the hospital. Patient was in the hospital between 10/31/2019 until 11/11/2019. Kyle Ritter is a 74 y.o. male with a history of hypertension, diabetes mellitus, treated brain aneurysm, was recently in the hospital between 10/31/2019 until 11/11/2019 for bacteremia due to Enterococcus and Streptococcus from the gallbladder with cholecystitis with dilated gallbladder due to stone at the neck with perforation of the gallbladder and liver abscess/abscess around the gallbladder.  He underwent IR cholecystostomy on 11/01/2019 and culture from that had double organisms along with Klebsiella.  Unasyn was changed to Zosyn.  He had a upgrading of the drain on 11/06/2019 and another 150 cc of pus was drained.  He had a repeat CT abdomen on 11/08/2019 and that showed interval decompression of the gallbladder and decrease in the hepatic abscess.  Patient was discharged home on Zosyn continuous infusion 13.4 g every 24 hours to complete 4 weeks on 11/29/2019.  Today he is here for a follow-up.  He has been doing okay.  No fever.  His appetite is better.  His pain in the right side is much improved His energy level is improving but not back to baseline.  His sister and his friend help with the IV infusion.   Past Medical History:  Diagnosis Date  . Brain aneurysm   . Diabetes mellitus without complication ([facility])   . Hypertension     Past Surgical History:  Procedure Laterality Date  . CEREBRAL ANEURYSM REPAIR      Social History   Socioeconomic History  . Marital status: Single    Spouse name: Not on file  . Number of children: Not on file  . Years of education: Not on file  . Highest education level: Not on file  Occupational History  . Not on file  Tobacco Use  . Smoking status: Former Research scientist (life sciences)  . Smokeless tobacco: Never Used  Substance and Sexual Activity  . Alcohol  use: No    Comment: quit alcohol 4 years ago.  . Drug use: Never  . Sexual activity: Not on file  Other Topics Concern  . Not on file  Social History Narrative  . Not on file   Social Determinants of Health   Financial Resource Strain:   . Difficulty of Paying Living Expenses:   Food Insecurity:   . Worried About Charity fundraiser in the Last Year:   . Arboriculturist in the Last Year:   Transportation Needs:   . Film/video editor (Medical):   Marland Kitchen Lack of Transportation (Non-Medical):   Physical Activity:   . Days of Exercise per Week:   . Minutes of Exercise per Session:   Stress:   . Feeling of Stress :   Social Connections:   . Frequency of Communication with Friends and Family:   . Frequency of Social Gatherings with Friends and Family:   . Attends Religious Services:   . Active Member of Clubs or Organizations:   . Attends Archivist Meetings:   Marland Kitchen Marital Status:   Intimate Partner Violence:   . Fear of Current or Ex-Partner:   . Emotionally Abused:   Marland Kitchen Physically Abused:   . Sexually Abused:     Family History  Problem Relation Age of Onset  . Diabetes Mother   . Diabetes Sister    No Known Allergies  ? Current Outpatient  Medications  Medication Sig Dispense Refill  . amLODipine (NORVASC) 10 MG tablet Take 1 tablet (10 mg total) by mouth daily. 30 tablet 0  . aspirin EC 81 MG EC tablet Take 1 tablet (81 mg total) by mouth daily. 30 tablet 0  . atorvastatin (LIPITOR) 40 MG tablet Take 1 tablet (40 mg total) by mouth daily at 6 PM. 30 tablet 0  . Multiple Vitamin (MULTIVITAMIN WITH MINERALS) TABS tablet Take 1 tablet by mouth daily. 30 tablet 0  . nitroGLYCERIN (NITROSTAT) 0.4 MG SL tablet Place 0.4 mg under the tongue as needed for chest pain.    . piperacillin-tazobactam (ZOSYN) IVPB Inject 13.5 g into the vein continuous for 20 days. Infuse 13.5gm of piperacillin/tazobactam daily over 24h as continuous infusion Indication: Enterococcus  fecalis and streptococcus gallolyticus bacteremia, gallbladder abscess/perforation, liver abscess Last Day of Therapy: 5/6/20201 Labs - Once weekly on Mondays:  CBC/D and CMP 21 Units 0  . primidone (MYSOLINE) 50 MG tablet Take 50 mg by mouth 4 (four) times daily. Take 2 tablets by mouth once daily and 2 tablets in the evening     No current facility-administered medications for this visit.     Abtx:  Anti-infectives (From admission, onward)   None      REVIEW OF SYSTEMS:  Const: negative fever, negative chills, negative weight loss Eyes: negative diplopia or visual changes, negative eye pain ENT: negative coryza, negative sore throat Resp: negative cough, hemoptysis, dyspnea Cards: negative for chest pain, palpitations, lower extremity edema GU: negative for frequency, dysuria and hematuria GI: Negative for abdominal pain, diarrhea, bleeding, constipation Skin: negative for rash and pruritus Heme: negative for easy bruising and gum/nose bleeding MS: Generalized weakness Neurolo: Essential tremors head and hand Psych: negative for feelings of anxiety, depression  Endocrine: Has diabetes Allergy/Immunology-as above  Objective:  VITALS:  BP (!) 175/81   Pulse 73   Temp 98.3 F (36.8 C) (Temporal)   Resp 16   Ht 5\' 11"  (1.803 m)   Wt 178 lb (80.7 kg)   SpO2 97%   BMI 24.83 kg/m  PHYSICAL EXAM: Patient in wheelchair hence limited examination General: Alert, cooperative, no distress, .  Weak Head: Normocephalic, without obvious abnormality, atraumatic. Eyes: Conjunctivae clear, anicteric sclerae. Pupils are equal ENT Nares normal. No drainage or sinus tenderness. Lips, mucosa, and tongue normal. No Thrush Neck: Supple,  Lungs: Clear to auscultation bilaterally. No Wheezing or Rhonchi. No rales. Heart: Regular rate and rhythm, no murmur, rub or gallop. Abdomen: Right upper quadrant drain.  Back has some bilious drainage extremities: Right PICC line Atraumatic, no  cyanosis. No edema. No clubbing Skin: No rashes or lesions. Or bruising Lymph: Cervical, supraclavicular normal. Neurologic: Essential tremors head and hand  pertinent Labs Lab Results CBC    Component Value Date/Time   WBC 9.8 11/09/2019 0529   RBC 3.26 (L) 11/09/2019 0529   HGB 9.3 (L) 11/09/2019 0529   HCT 29.3 (L) 11/09/2019 0529   PLT 350 11/09/2019 0529   MCV 89.9 11/09/2019 0529   MCH 28.5 11/09/2019 0529   MCHC 31.7 11/09/2019 0529   RDW 13.4 11/09/2019 0529   LYMPHSABS 1.2 11/09/2019 0529   MONOABS 0.8 11/09/2019 0529   EOSABS 0.2 11/09/2019 0529   BASOSABS 0.0 11/09/2019 0529    CMP Latest Ref Rng & Units 11/11/2019 11/10/2019 11/09/2019  Glucose 70 - 99 mg/dL 11/11/2019) 341(D) 622(W)  BUN 8 - 23 mg/dL 14 11 11   Creatinine 0.61 - 1.24 mg/dL 979(G 9.21  Sodium 135 - 145 mmol/L 133(L) 136 139  Potassium 3.5 - 5.1 mmol/L 4.2 4.5 4.2  Chloride 98 - 111 mmol/L 98 99 102  CO2 22 - 32 mmol/L 27 29 32  Calcium 8.9 - 10.3 mg/dL 8.3(M) 9.0 6.2(H)  Total Protein 6.5 - 8.1 g/dL - - -  Total Bilirubin 0.3 - 1.2 mg/dL - - -  Alkaline Phos 38 - 126 U/L - - -  AST 15 - 41 U/L - - -  ALT 0 - 44 U/L - - -   ? Impression/Recommendation ? ?Acute on chronic cholecystitis complicated by gallbladder abscess secondary to perforation due to gallstone obstruction of the neck.  Liver and pericolic abscess.  Had cholecystostomy on 11/01/2019 and the drain was upgraded on 11/06/2019.  Currently on Zosyn as the cultures had Klebsiella, Enterococcus and Streptococcus catalytic is.  He will finish 4 weeks of IV antibiotics on 11/29/2019 and the PICC line will be removed.  He will go on p.o. Augmentin for 2 more weeks. He still has the drain and will see his surgeon on 11/29/2019.  An appointment is made  with Dr. Claudine Mouton  ?Polymicrobial bacteremia.  Enterococcus faecalis and Streptococcus in blood culture secondary to the above.  This is  resolved. ___________________________________________________ Discussed with patient, and communicated with the surgical team. Note:  This document was prepared using Dragon voice recognition software and may include unintentional dictation errors.

## 2019-11-29 ENCOUNTER — Encounter: Payer: Self-pay | Admitting: Surgery

## 2019-11-29 ENCOUNTER — Other Ambulatory Visit: Payer: Self-pay

## 2019-11-29 ENCOUNTER — Ambulatory Visit (INDEPENDENT_AMBULATORY_CARE_PROVIDER_SITE_OTHER): Payer: 59 | Admitting: Surgery

## 2019-11-29 VITALS — BP 185/83 | HR 81 | Temp 98.2°F | Ht 71.5 in | Wt 178.4 lb

## 2019-11-29 DIAGNOSIS — K812 Acute cholecystitis with chronic cholecystitis: Secondary | ICD-10-CM | POA: Diagnosis not present

## 2019-11-29 DIAGNOSIS — L0291 Cutaneous abscess, unspecified: Secondary | ICD-10-CM | POA: Diagnosis not present

## 2019-11-29 NOTE — Progress Notes (Signed)
[facility] POST-OP OFFICE VISIT  11/29/2019  HPI: Kyle Ritter is a 74 y.o. male 4 weeks s/p initial percutaneous cholecystostomy and subsequent revision 5 days later to help drain the pericystic/hepatic abscess.  He has just completed his course of parenteral IV antibiotics at home.  He has no remarkable abdominal pain aside from the drain site, is tolerating a diet no fevers or chills.  Vital signs: BP (!) 185/83   Pulse 81   Temp 98.2 F (36.8 C) (Temporal)   Ht 5' 11.5" (1.816 m)   Wt 178 lb 6.4 oz (80.9 kg)   SpO2 98%   BMI 24.53 kg/m    Physical Exam: Constitutional: Appears much better than when I last saw him, alert moving better no evidence of pain or toxicity. Abdomen: Right upper quadrant gallbladder drain with bilious drainage within it. Skin: Right PIC line removed, site cleansed and dressed.  Assessment/Plan: This is a 74 y.o. male s/p percutaneous cholecystostomy/hepatic abscess drainage.  Patient Active Problem List   Diagnosis Date Noted  . Abscess   . Bacteremia   . Acute on chronic cholecystitis 10/31/2019  . Elevated troponin 09/22/2015    -I understand he was switched over to oral antibiotics, I need to repeat CT scan early in June, and pending its results determine whether to proceed with cholecystectomy at that time.   Campbell Lerner M.D., FACS 11/29/2019, 2:35 PM

## 2019-11-29 NOTE — Patient Instructions (Addendum)
Dr. Christian Mate discussed with patient to have a repeat CT scan in one month around the first week of June.   Dr.Rodenberg removed PICC Line for patient at today's visit.   Patient has been scheduled for a CT abdomen/pelvis with contrast at [facility] for June 3rd, 2021 (arrive between 10:30-10:45am). Prep: NPO 4 hours prior and pick up prep kit. Patient verbalizes understanding.

## 2019-12-03 ENCOUNTER — Other Ambulatory Visit: Payer: Self-pay | Admitting: Infectious Diseases

## 2019-12-03 MED ORDER — AMOXICILLIN-POT CLAVULANATE 875-125 MG PO TABS: 1.0000 | ORAL_TABLET | Freq: Two times a day (BID) | ORAL | 0 refills | Status: DC

## 2019-12-27 ENCOUNTER — Other Ambulatory Visit: Payer: Self-pay

## 2019-12-27 ENCOUNTER — Ambulatory Visit
Admission: RE | Admit: 2019-12-27 | Discharge: 2019-12-27 | Disposition: A | Discharge status: Home / Self Care | Payer: 59 | Source: Ambulatory Visit | Attending: Surgery | Admitting: Surgery

## 2019-12-27 DIAGNOSIS — K812 Acute cholecystitis with chronic cholecystitis: Secondary | ICD-10-CM | POA: Diagnosis present

## 2019-12-27 DIAGNOSIS — L0291 Cutaneous abscess, unspecified: Secondary | ICD-10-CM | POA: Diagnosis present

## 2019-12-27 LAB — POCT I-STAT CREATININE: Creatinine, Ser: 1.1 mg/dL (ref 0.61–1.24)

## 2019-12-27 MED ORDER — IOHEXOL 300 MG/ML  SOLN
100.0000 mL | Freq: Once | INTRAMUSCULAR | Status: AC | PRN
  Administered 2019-12-27: 100 mL via INTRAVENOUS

## 2019-12-31 ENCOUNTER — Emergency Department: Admission: EM | Admit: 2019-12-31 | Discharge: 2019-12-31 | Discharge status: Absent without leave | Payer: 59

## 2019-12-31 ENCOUNTER — Other Ambulatory Visit: Payer: Self-pay

## 2020-01-01 ENCOUNTER — Encounter: Payer: Self-pay | Admitting: Surgery

## 2020-01-01 ENCOUNTER — Ambulatory Visit (INDEPENDENT_AMBULATORY_CARE_PROVIDER_SITE_OTHER): Payer: 59 | Admitting: Surgery

## 2020-01-01 VITALS — BP 186/88 | HR 84 | Temp 95.2°F | Ht 71.5 in | Wt 182.2 lb

## 2020-01-01 DIAGNOSIS — L0291 Cutaneous abscess, unspecified: Secondary | ICD-10-CM | POA: Diagnosis not present

## 2020-01-01 DIAGNOSIS — K812 Acute cholecystitis with chronic cholecystitis: Secondary | ICD-10-CM | POA: Diagnosis not present

## 2020-01-01 NOTE — H&P (View-Only) (Signed)
Patient ID: Kyle Ritter, male   DOB: 1945-09-03, 74 y.o.   MRN: [id number]  Chief Complaint: Cholecystostomy tube, resolving hepatic abscess/acute calculus cholecystitis.  History of Present Illness Kyle Ritter is a 74 y.o. male with prior percutaneous tube placed for sepsis secondary to likely perforated cholecystitis and secondary adjacent hepatic abscess.  He reports there is no significant drainage in his tube, nor has there been 4 weeks.  He is about 8 weeks out from a percutaneous drain placement.  Follow-up CT scan shows marked resolution of the prior gallbladder and adjacent liver infection.  He reports feeling quite well, denying just the pain of the drain itself.  He is interested in proceeding with surgical resection because he has a significant amount of gallstones, and I believe is at significant risk of having recurrence of the acute cholecystitis should the drain be removed.  Past Medical History Past Medical History:  Diagnosis Date  . Brain aneurysm   . Diabetes mellitus without complication ([facility])   . Hypertension       Past Surgical History:  Procedure Laterality Date  . CEREBRAL ANEURYSM REPAIR      No Known Allergies  Current Outpatient Medications  Medication Sig Dispense Refill  . amLODipine (NORVASC) 10 MG tablet Take 1 tablet (10 mg total) by mouth daily. 30 tablet 0  . aspirin EC 81 MG EC tablet Take 1 tablet (81 mg total) by mouth daily. 30 tablet 0  . atorvastatin (LIPITOR) 40 MG tablet Take 1 tablet (40 mg total) by mouth daily at 6 PM. 30 tablet 0  . Multiple Vitamin (MULTIVITAMIN WITH MINERALS) TABS tablet Take 1 tablet by mouth daily. 30 tablet 0  . nitroGLYCERIN (NITROSTAT) 0.4 MG SL tablet Place 0.4 mg under the tongue as needed for chest pain.    . primidone (MYSOLINE) 50 MG tablet Take 50 mg by mouth 4 (four) times daily. Take 2 tablets by mouth once daily and 2 tablets in the evening    . amoxicillin-clavulanate (AUGMENTIN) 875-125 MG tablet Take 1  tablet by mouth 2 (two) times daily. (Patient not taking: Reported on 01/01/2020) 30 tablet 0   No current facility-administered medications for this visit.    Family History Family History  Problem Relation Age of Onset  . Diabetes Mother   . Diabetes Sister       Social History Social History   Tobacco Use  . Smoking status: Former Games developer  . Smokeless tobacco: Never Used  Substance Use Topics  . Alcohol use: No    Comment: quit alcohol 4 years ago.  . Drug use: Never        Review of Systems  Constitutional: Negative for chills and fever.  HENT: Negative.   Eyes: Negative.   Respiratory: Negative for cough, shortness of breath and wheezing.   Cardiovascular: Negative for chest pain.  Gastrointestinal: Negative for blood in stool, diarrhea, nausea and vomiting.  Genitourinary: Negative.   Musculoskeletal: Negative.   Skin: Negative.   Neurological: Negative.   Endo/Heme/Allergies: Negative.       Physical Exam Blood pressure (!) 186/88, pulse 84, temperature (!) 95.2 F (35.1 C), temperature source Temporal, height 5' 11.5" (1.816 m), weight 182 lb 3.2 oz (82.6 kg), SpO2 95 %. Last Weight  Most recent update: 01/01/2020  2:54 PM   Weight  82.6 kg (182 lb 3.2 oz)            CONSTITUTIONAL: Well developed, and nourished, appropriately responsive and aware without distress.  EYES: Sclera non-icteric.   EARS, NOSE, MOUTH AND THROAT: Mask worn.     Hearing is intact to voice.  NECK: Trachea is midline, and there is no jugular venous distension.  LYMPH NODES:  Lymph nodes in the neck are not enlarged. RESPIRATORY:  Lungs are clear, and breath sounds are equal bilaterally. Normal respiratory effort without pathologic use of accessory muscles. CARDIOVASCULAR: Heart is regular in rate and rhythm. GI: The abdomen is soft, nontender, and nondistended.  There is a right upper quadrant percutaneous drain in place with dressing around it.  There were no palpable  masses.There were normal bowel sounds. MUSCULOSKELETAL:  Symmetrical muscle tone appreciated in all four extremities.    SKIN: Skin turgor is normal. No pathologic skin lesions appreciated.  NEUROLOGIC:  Motor and sensation appear grossly normal.  Cranial nerves are grossly without defect. PSYCH:  Alert and oriented to person, place and time. Affect is appropriate for situation.  Data Reviewed I have personally reviewed what is currently available of the patient's imaging, recent labs and medical records.   Labs:  CBC Latest Ref Rng & Units 11/09/2019 11/08/2019 11/07/2019  WBC 4.0 - 10.5 K/uL 9.8 10.0 12.0(H)  Hemoglobin 13.0 - 17.0 g/dL 1.6(X) 10.0(L) 9.8(L)  Hematocrit 39.0 - 52.0 % 29.3(L) 30.3(L) 29.4(L)  Platelets 150 - 400 K/uL 350 354 363   CMP Latest Ref Rng & Units 12/27/2019 11/11/2019 11/10/2019  Glucose 70 - 99 mg/dL - 096(E) 454(U)  BUN 8 - 23 mg/dL - 14 11  Creatinine 9.81 - 1.24 mg/dL 1.91 4.78 2.95  Sodium 135 - 145 mmol/L - 133(L) 136  Potassium 3.5 - 5.1 mmol/L - 4.2 4.5  Chloride 98 - 111 mmol/L - 98 99  CO2 22 - 32 mmol/L - 27 29  Calcium 8.9 - 10.3 mg/dL - 8.8(L) 9.0  Total Protein 6.5 - 8.1 g/dL - - -  Total Bilirubin 0.3 - 1.2 mg/dL - - -  Alkaline Phos 38 - 126 U/L - - -  AST 15 - 41 U/L - - -  ALT 0 - 44 U/L - - -      Imaging: Radiology review:  CLINICAL DATA:  Percutaneous cholecystostomy.  EXAM: CT ABDOMEN AND PELVIS WITH CONTRAST  TECHNIQUE: Multidetector CT imaging of the abdomen and pelvis was performed using the standard protocol following bolus administration of intravenous contrast.  CONTRAST:  OMNIPAQUE IOHEXOL 300 MG/ML  SOLN  COMPARISON:  CT 11/08/2019  FINDINGS: Lower chest: Lung bases are clear.  Hepatobiliary: Percutaneous drainage catheter with pigtail in the fundus of the gallbladder. There is marked reduction in the perihepatic abscess with near complete resolution. The gallbladder is less inflamed. There is no  pericholecystic fluid. No gas within the gallbladder fossa as seen on comparison exam. Several small gallstones noted towards the neck of the gallbladder. No biliary duct dilatation. Common bile duct normal.  Pancreas: Pancreas is normal. No ductal dilatation. No pancreatic inflammation.  Spleen: Normal spleen  Adrenals/urinary tract: Adrenal glands normal. Simple fluid cyst in the RIGHT kidney. Ureters and bladder normal.  Stomach/Bowel: Stomach, small bowel, appendix, and cecum are normal. The colon and rectosigmoid colon are normal.  Vascular/Lymphatic: Abdominal aorta is normal caliber with atherosclerotic calcification. There is no retroperitoneal or periportal lymphadenopathy. No pelvic lymphadenopathy.  Reproductive: Prostate unremarkable  Other: Bilateral fat filled inguinal hernias.  Musculoskeletal: No aggressive osseous lesion.  IMPRESSION: 1. Marked improvement in gallbladder inflammation. Resolution of abscess in the perihepatic tissue along the gallbladder fossa. Resolution of  gas within the gallbladder and in the adjacent hepatic parenchyma. Percutaneous drainage catheter remains within the gallbladder fundus. No organized fluid collections. 2. No biliary duct dilatation. 3. Normal pancreas.   Electronically Signed   By: Suzy Bouchard M.D.   On: 12/27/2019 16:35  Within last 24 hrs: No results found.  Assessment    Resolving pericystic hepatic abscess, and acute calculus cholecystitis status post percutaneous cholecystostomy.  Now 8 weeks out. Patient Active Problem List   Diagnosis Date Noted  . Abscess   . Bacteremia   . Acute on chronic cholecystitis 10/31/2019  . Elevated troponin 09/22/2015    Plan    We discussed the options of continuing the drain indefinitely, or proceeding with surgery.  I believe he is a reasonable risk to proceed with an operation now that the acute inflammatory process is remarkably improved.  He desires  to pursue this, but would like to wait until after he has had a planned visit with his son in the coming weeks.   The risks, benefits, potential complications, alternative treatment options, evaluations and diagnoses, and likely outcomes were discussed in detail with the patient. The possibility of anesthetic complications, bleeding, infection, finding a normal appearing gallbladder, trauma to adjacent organs, or injury to surrounding structures, bile leak or obstruction,  the need for additional procedures, reaction to medication, pulmonary aspiration, the possible need to convert to an open procedure, and issues requiring transfusion or further operations were discussed with the patient. Questions sought, and answered to satisfaction.  No guarantees were ever spoken or implied.  The patient and/or family concurred with the proposed plan, and gave informed consent.    Face-to-face time spent with the patient and accompanying care providers(if present) was 40 minutes, with more than 50% of the time spent counseling, educating, and coordinating care of the patient.      Ronny Bacon M.D., FACS 01/01/2020, 5:54 PM

## 2020-01-01 NOTE — Progress Notes (Signed)
Patient ID: Kyle Ritter, male   DOB: 06/04/1946, 74 y.o.   MRN: [id number]  Chief Complaint: Cholecystostomy tube, resolving hepatic abscess/acute calculus cholecystitis.  History of Present Illness Kyle Ritter is a 74 y.o. male with prior percutaneous tube placed for sepsis secondary to likely perforated cholecystitis and secondary adjacent hepatic abscess.  He reports there is no significant drainage in his tube, nor has there been 4 weeks.  He is about 8 weeks out from a percutaneous drain placement.  Follow-up CT scan shows marked resolution of the prior gallbladder and adjacent liver infection.  He reports feeling quite well, denying just the pain of the drain itself.  He is interested in proceeding with surgical resection because he has a significant amount of gallstones, and I believe is at significant risk of having recurrence of the acute cholecystitis should the drain be removed.  Past Medical History Past Medical History:  Diagnosis Date  . Brain aneurysm   . Diabetes mellitus without complication (HCC)   . Hypertension       Past Surgical History:  Procedure Laterality Date  . CEREBRAL ANEURYSM REPAIR      No Known Allergies  Current Outpatient Medications  Medication Sig Dispense Refill  . amLODipine (NORVASC) 10 MG tablet Take 1 tablet (10 mg total) by mouth daily. 30 tablet 0  . aspirin EC 81 MG EC tablet Take 1 tablet (81 mg total) by mouth daily. 30 tablet 0  . atorvastatin (LIPITOR) 40 MG tablet Take 1 tablet (40 mg total) by mouth daily at 6 PM. 30 tablet 0  . Multiple Vitamin (MULTIVITAMIN WITH MINERALS) TABS tablet Take 1 tablet by mouth daily. 30 tablet 0  . nitroGLYCERIN (NITROSTAT) 0.4 MG SL tablet Place 0.4 mg under the tongue as needed for chest pain.    . primidone (MYSOLINE) 50 MG tablet Take 50 mg by mouth 4 (four) times daily. Take 2 tablets by mouth once daily and 2 tablets in the evening    . amoxicillin-clavulanate (AUGMENTIN) 875-125 MG tablet Take 1  tablet by mouth 2 (two) times daily. (Patient not taking: Reported on 01/01/2020) 30 tablet 0   No current facility-administered medications for this visit.    Family History Family History  Problem Relation Age of Onset  . Diabetes Mother   . Diabetes Sister       Social History Social History   Tobacco Use  . Smoking status: Former Smoker  . Smokeless tobacco: Never Used  Substance Use Topics  . Alcohol use: No    Comment: quit alcohol 4 years ago.  . Drug use: Never        Review of Systems  Constitutional: Negative for chills and fever.  HENT: Negative.   Eyes: Negative.   Respiratory: Negative for cough, shortness of breath and wheezing.   Cardiovascular: Negative for chest pain.  Gastrointestinal: Negative for blood in stool, diarrhea, nausea and vomiting.  Genitourinary: Negative.   Musculoskeletal: Negative.   Skin: Negative.   Neurological: Negative.   Endo/Heme/Allergies: Negative.       Physical Exam Blood pressure (!) 186/88, pulse 84, temperature (!) 95.2 F (35.1 C), temperature source Temporal, height 5' 11.5" (1.816 m), weight 182 lb 3.2 oz (82.6 kg), SpO2 95 %. Last Weight  Most recent update: 01/01/2020  2:54 PM   Weight  82.6 kg (182 lb 3.2 oz)            CONSTITUTIONAL: Well developed, and nourished, appropriately responsive and aware without distress.     EYES: Sclera non-icteric.   EARS, NOSE, MOUTH AND THROAT: Mask worn.     Hearing is intact to voice.  NECK: Trachea is midline, and there is no jugular venous distension.  LYMPH NODES:  Lymph nodes in the neck are not enlarged. RESPIRATORY:  Lungs are clear, and breath sounds are equal bilaterally. Normal respiratory effort without pathologic use of accessory muscles. CARDIOVASCULAR: Heart is regular in rate and rhythm. GI: The abdomen is soft, nontender, and nondistended.  There is a right upper quadrant percutaneous drain in place with dressing around it.  There were no palpable  masses.There were normal bowel sounds. MUSCULOSKELETAL:  Symmetrical muscle tone appreciated in all four extremities.    SKIN: Skin turgor is normal. No pathologic skin lesions appreciated.  NEUROLOGIC:  Motor and sensation appear grossly normal.  Cranial nerves are grossly without defect. PSYCH:  Alert and oriented to person, place and time. Affect is appropriate for situation.  Data Reviewed I have personally reviewed what is currently available of the patient's imaging, recent labs and medical records.   Labs:  CBC Latest Ref Rng & Units 11/09/2019 11/08/2019 11/07/2019  WBC 4.0 - 10.5 K/uL 9.8 10.0 12.0(H)  Hemoglobin 13.0 - 17.0 g/dL 1.6(X) 10.0(L) 9.8(L)  Hematocrit 39.0 - 52.0 % 29.3(L) 30.3(L) 29.4(L)  Platelets 150 - 400 K/uL 350 354 363   CMP Latest Ref Rng & Units 12/27/2019 11/11/2019 11/10/2019  Glucose 70 - 99 mg/dL - 096(E) 454(U)  BUN 8 - 23 mg/dL - 14 11  Creatinine 9.81 - 1.24 mg/dL 1.91 4.78 2.95  Sodium 135 - 145 mmol/L - 133(L) 136  Potassium 3.5 - 5.1 mmol/L - 4.2 4.5  Chloride 98 - 111 mmol/L - 98 99  CO2 22 - 32 mmol/L - 27 29  Calcium 8.9 - 10.3 mg/dL - 8.8(L) 9.0  Total Protein 6.5 - 8.1 g/dL - - -  Total Bilirubin 0.3 - 1.2 mg/dL - - -  Alkaline Phos 38 - 126 U/L - - -  AST 15 - 41 U/L - - -  ALT 0 - 44 U/L - - -      Imaging: Radiology review:  CLINICAL DATA:  Percutaneous cholecystostomy.  EXAM: CT ABDOMEN AND PELVIS WITH CONTRAST  TECHNIQUE: Multidetector CT imaging of the abdomen and pelvis was performed using the standard protocol following bolus administration of intravenous contrast.  CONTRAST:  OMNIPAQUE IOHEXOL 300 MG/ML  SOLN  COMPARISON:  CT 11/08/2019  FINDINGS: Lower chest: Lung bases are clear.  Hepatobiliary: Percutaneous drainage catheter with pigtail in the fundus of the gallbladder. There is marked reduction in the perihepatic abscess with near complete resolution. The gallbladder is less inflamed. There is no  pericholecystic fluid. No gas within the gallbladder fossa as seen on comparison exam. Several small gallstones noted towards the neck of the gallbladder. No biliary duct dilatation. Common bile duct normal.  Pancreas: Pancreas is normal. No ductal dilatation. No pancreatic inflammation.  Spleen: Normal spleen  Adrenals/urinary tract: Adrenal glands normal. Simple fluid cyst in the RIGHT kidney. Ureters and bladder normal.  Stomach/Bowel: Stomach, small bowel, appendix, and cecum are normal. The colon and rectosigmoid colon are normal.  Vascular/Lymphatic: Abdominal aorta is normal caliber with atherosclerotic calcification. There is no retroperitoneal or periportal lymphadenopathy. No pelvic lymphadenopathy.  Reproductive: Prostate unremarkable  Other: Bilateral fat filled inguinal hernias.  Musculoskeletal: No aggressive osseous lesion.  IMPRESSION: 1. Marked improvement in gallbladder inflammation. Resolution of abscess in the perihepatic tissue along the gallbladder fossa. Resolution of  gas within the gallbladder and in the adjacent hepatic parenchyma. Percutaneous drainage catheter remains within the gallbladder fundus. No organized fluid collections. 2. No biliary duct dilatation. 3. Normal pancreas.   Electronically Signed   By: Suzy Bouchard M.D.   On: 12/27/2019 16:35  Within last 24 hrs: No results found.  Assessment    Resolving pericystic hepatic abscess, and acute calculus cholecystitis status post percutaneous cholecystostomy.  Now 8 weeks out. Patient Active Problem List   Diagnosis Date Noted  . Abscess   . Bacteremia   . Acute on chronic cholecystitis 10/31/2019  . Elevated troponin 09/22/2015    Plan    We discussed the options of continuing the drain indefinitely, or proceeding with surgery.  I believe he is a reasonable risk to proceed with an operation now that the acute inflammatory process is remarkably improved.  He desires  to pursue this, but would like to wait until after he has had a planned visit with his son in the coming weeks.   The risks, benefits, potential complications, alternative treatment options, evaluations and diagnoses, and likely outcomes were discussed in detail with the patient. The possibility of anesthetic complications, bleeding, infection, finding a normal appearing gallbladder, trauma to adjacent organs, or injury to surrounding structures, bile leak or obstruction,  the need for additional procedures, reaction to medication, pulmonary aspiration, the possible need to convert to an open procedure, and issues requiring transfusion or further operations were discussed with the patient. Questions sought, and answered to satisfaction.  No guarantees were ever spoken or implied.  The patient and/or family concurred with the proposed plan, and gave informed consent.    Face-to-face time spent with the patient and accompanying care providers(if present) was 40 minutes, with more than 50% of the time spent counseling, educating, and coordinating care of the patient.      Ronny Bacon M.D., FACS 01/01/2020, 5:54 PM

## 2020-01-01 NOTE — Patient Instructions (Signed)
You have requested to have your gallbladder removed. This will be done on 01/16/20 at [facility] with Dr. Claudine Mouton.  You will most likely be out of work 1-2 weeks for this surgery. You will return after your post-op appointment with a lifting restriction for approximately 4 more weeks.  You will be able to eat anything you would like to following surgery. But, start by eating a bland diet and advance this as tolerated. The Gallbladder diet is below, please go as closely by this diet as possible prior to surgery to avoid any further attacks.  Please see the (blue)pre-care form that you have been given today. If you have any questions, please call our office.  Laparoscopic Cholecystectomy Laparoscopic cholecystectomy is surgery to remove the gallbladder. The gallbladder is located in the upper right part of the abdomen, behind the liver. It is a storage sac for bile, which is produced in the liver. Bile aids in the digestion and absorption of fats. Cholecystectomy is often done for inflammation of the gallbladder (cholecystitis). This condition is usually caused by a buildup of gallstones (cholelithiasis) in the gallbladder. Gallstones can block the flow of bile, and that can result in inflammation and pain. In severe cases, emergency surgery may be required. If emergency surgery is not required, you will have time to prepare for the procedure. Laparoscopic surgery is an alternative to open surgery. Laparoscopic surgery has a shorter recovery time. Your common bile duct may also need to be examined during the procedure. If stones are found in the common bile duct, they may be removed. LET [facility] CARE PROVIDER KNOW ABOUT:  Any allergies you have.  All medicines you are taking, including vitamins, herbs, eye drops, creams, and over-the-counter medicines.  Previous problems you or members of your family have had with the use of anesthetics.  Any blood disorders you have.  Previous  surgeries you have had.    Any medical conditions you have. RISKS AND COMPLICATIONS Generally, this is a safe procedure. However, problems may occur, including:  Infection.  Bleeding.  Allergic reactions to medicines.  Damage to other structures or organs.  A stone remaining in the common bile duct.  A bile leak from the cyst duct that is clipped when your gallbladder is removed.  The need to convert to open surgery, which requires a larger incision in the abdomen. This may be necessary if your surgeon thinks that it is not safe to continue with a laparoscopic procedure. BEFORE THE PROCEDURE  Ask your health care provider about:  Changing or stopping your regular medicines. This is especially important if you are taking diabetes medicines or blood thinners.  Taking medicines such as aspirin and ibuprofen. These medicines can thin your blood. Do not take these medicines before your procedure if your health care provider instructs you not to.  Follow instructions from your health care provider about eating or drinking restrictions.  Let your health care provider know if you develop a cold or an infection before surgery.  Plan to have someone take you home after the procedure.  Ask your health care provider how your surgical site will be marked or identified.  You may be given antibiotic medicine to help prevent infection. PROCEDURE  To reduce your risk of infection:  Your health care team will wash or sanitize their hands.  Your skin will be washed with soap.  An IV tube may be inserted into one of your veins.  You will be given a medicine to make  you fall asleep (general anesthetic).  A breathing tube will be placed in your mouth.  The surgeon will make several small cuts (incisions) in your abdomen.  A thin, lighted tube (laparoscope) that has a tiny camera on the end will be inserted through one of the small incisions. The camera on the laparoscope will send a  picture to a TV screen (monitor) in the operating room. This will give the surgeon a good view inside your abdomen.  A gas will be pumped into your abdomen. This will expand your abdomen to give the surgeon more room to perform the surgery.  Other tools that are needed for the procedure will be inserted through the other incisions. The gallbladder will be removed through one of the incisions.  After your gallbladder has been removed, the incisions will be closed with stitches (sutures), staples, or skin glue.  Your incisions may be covered with a bandage (dressing). The procedure may vary among health care providers and hospitals. AFTER THE PROCEDURE  Your blood pressure, heart rate, breathing rate, and blood oxygen level will be monitored often until the medicines you were given have worn off.  You will be given medicines as needed to control your pain.   This information is not intended to replace advice given to you by your health care provider. Make sure you discuss any questions you have with your health care provider.   Document Released: 07/12/2005 Document Revised: 04/02/2015 Document Reviewed: 02/21/2013 Elsevier Interactive Patient Education 2016 [place] Diet for Gallbladder Conditions A low-fat diet can be helpful if you have pancreatitis or a gallbladder condition. With these conditions, your pancreas and gallbladder have trouble digesting fats. A healthy eating plan with less fat will help rest your pancreas and gallbladder and reduce your symptoms. WHAT DO I NEED TO KNOW ABOUT THIS DIET?  Eat a low-fat diet.  Reduce your fat intake to less than 20-30% of your total daily calories. This is less than 50-60 g of fat per day.  Remember that you need some fat in your diet. Ask your dietician what your daily goal should be.  Choose nonfat and low-fat healthy foods. Look for the words "nonfat," "low fat," or "fat free."  As a guide, look on the label and  choose foods with less than 3 g of fat per serving. Eat only one serving.  Avoid alcohol.  Do not smoke. If you need help quitting, talk with your health care provider.  Eat small frequent meals instead of three large heavy meals. WHAT FOODS CAN I EAT? Grains Include healthy grains and starches such as potatoes, wheat bread, fiber-rich cereal, and brown rice. Choose whole grain options whenever possible. In adults, whole grains should account for 45-65% of your daily calories.  Fruits and Vegetables Eat plenty of fruits and vegetables. Fresh fruits and vegetables add fiber to your diet. Meats and Other Protein Sources Eat lean meat such as chicken and pork. Trim any fat off of meat before cooking it. Eggs, fish, and beans are other sources of protein. In adults, these foods should account for 10-35% of your daily calories. Dairy Choose low-fat milk and dairy options. Dairy includes fat and protein, as well as calcium.  Fats and Oils Limit high-fat foods such as fried foods, sweets, baked goods, sugary drinks.  Other Creamy sauces and condiments, such as mayonnaise, can add extra fat. Think about whether or not you need to use them, or use smaller amounts or low fat options.  WHAT FOODS ARE NOT RECOMMENDED?  High fat foods, such as:  [employer].  Ice cream.  [place] toast.  Sweet rolls.  Pizza.  Cheese bread.  Foods covered with batter, butter, creamy sauces, or cheese.  Fried foods.  Sugary drinks and desserts.  Foods that cause gas or bloating   This information is not intended to replace advice given to you by your health care provider. Make sure you discuss any questions you have with your health care provider.   Document Released: 07/17/2013 Document Reviewed: 07/17/2013 Elsevier Interactive Patient Education [employer].

## 2020-01-02 ENCOUNTER — Ambulatory Visit: Payer: Self-pay | Admitting: Surgery

## 2020-01-02 ENCOUNTER — Telehealth: Payer: Self-pay | Admitting: Surgery

## 2020-01-02 DIAGNOSIS — K812 Acute cholecystitis with chronic cholecystitis: Secondary | ICD-10-CM

## 2020-01-02 NOTE — Telephone Encounter (Signed)
Spoke with sister, Karen Kitchens, she has been advised of Pre-Admission date/time, COVID Testing date and Surgery date.  Same information is also mailed to them.    Surgery Date: 01/16/20 Preadmission Testing Date: 01/10/20 (phone 8a-1p) Covid Testing Date: 01/14/20 - [facility] [facility]) between 8a-1p   Also they have been informed to call [phone], between 1-3:00pm the day before surgery, to find out what time to arrive for surgery.

## 2020-01-10 ENCOUNTER — Inpatient Hospital Stay
Admission: RE | Admit: 2020-01-10 | Discharge: 2020-01-10 | Disposition: A | Discharge status: Home / Self Care | Payer: 59 | Source: Ambulatory Visit

## 2020-01-10 NOTE — Pre-Procedure Instructions (Signed)
EKG My review and personal interpretation at Time: 18:24   Indication: fever  Rate: 100  Rhythm: sinus Axis: left Other: normal intervals, no stemi ____________________________________________  RADIOLOGY  I personally reviewed all radiographic images ordered to evaluate for the above acute complaints and reviewed radiology reports and findings.  These findings were personally discussed with the patient.  Please see medical record for radiology report.  ____________________________________________   PROCEDURES  Procedure(s) performed:  .Critical Care Performed by: Merlyn Lot, MD Authorized by: Merlyn Lot, MD   Critical care provider statement:    Critical care time (minutes):  35   Critical care time was exclusive of:  Separately billable procedures and treating other patients   Critical care was necessary to treat or prevent imminent or life-threatening deterioration of the following conditions:  Sepsis   Critical care was time spent personally by me on the following activities:  Development of treatment plan with patient or surrogate, discussions with consultants, evaluation of patient's response to treatment, examination of patient, obtaining history from patient or surrogate, ordering and performing treatments and interventions, ordering and review of laboratory studies, ordering and review of radiographic studies, pulse oximetry, re-evaluation of patient's condition and review of old charts      Critical Care performed: yes ____________________________________________   INITIAL IMPRESSION / Okay / ED COURSE  Pertinent labs & imaging results that were available during my care of the patient were reviewed by me and considered in my medical decision making (see chart for details).  DDX: Sepsis, bacteremia, pneumonia, UTI, appendicitis, diverticulitis, cholecystitis, abscess  Kyle Ritter is a 73 y.o. who presents to the ED with  symptoms as described above.  Patient febrile having rigors.  Protecting his airway.  The patient will be placed on continuous pulse oximetry and telemetry for monitoring.  Laboratory evaluation will be sent to evaluate for the above complaints.        Clinical Course as of Oct 30 2233  Wed Oct 31, 2019  2011 Lactate is normal.  I am concerned for Covid given his decreased lymphocyte but procalcitonin is elevated.  No clear evidence of pneumonia.  Given his elevated LFTs will order ultrasound of right upper quadrant.   [PR]  2017 Denies any lower abdominal pain.  Patient feels improved.  Heart rate improved.   [PR]  2110 Patient reassessed.  Now not having any right upper quadrant pain is having some right lower quadrant pain will send for CT given his fever to evaluate for abscess appendicitis diverticulitis.    [PR]  2219 Case discussed with Dr. Christian Mate of general surgery.  Not felt to be a good surgical candidate at this time.  We will plan admission for antibiotics and evaluation of PERC drain.  Patient continues to improve clinically.   [PR]    Clinical Course User Index [PR] Merlyn Lot, MD    The patient was evaluated in Emergency Department today for the symptoms described in the history of present illness. He/she was evaluated in the context of the global COVID-19 pandemic, which necessitated consideration that the patient might be at risk for infection with the SARS-CoV-2 virus that causes COVID-19. Institutional protocols and algorithms that pertain to the evaluation of patients at risk for COVID-19 are in a state of rapid change based on information released by regulatory bodies including the CDC and federal and state organizations. These policies and algorithms were followed during the patient's care in the ED.  As part of my medical decision  making, I reviewed the following data within the electronic MEDICAL RECORD NUMBERNursing notes reviewed and incorporated, Labs  reviewed, notes from prior ED visits and  Controlled Substance Database   ____________________________________________   FINAL CLINICAL IMPRESSION(S) / ED DIAGNOSES  Final diagnoses:  Elevated LFTs  Sepsis without acute organ dysfunction, due to unspecified organism [facility])  Chronic cholecystitis      NEW MEDICATIONS STARTED DURING THIS VISIT:     New Prescriptions   No medications on file     Note:  This document was prepared using Dragon voice recognition software and may include unintentional dictation errors.    Willy Eddy, MD 10/31/19 2235         Electronically signed by Willy Eddy, MD at 10/31/2019 10:35 PM  ED to Hosp-Admission (Discharged) on 10/31/2019   ED to Hosp-Admission (Discharged) on 10/31/2019     Detailed Report    Note shared with patient

## 2020-01-11 ENCOUNTER — Inpatient Hospital Stay
Admission: RE | Admit: 2020-01-11 | Discharge: 2020-01-11 | Disposition: A | Discharge status: Home / Self Care | Payer: 59 | Source: Ambulatory Visit

## 2020-01-14 ENCOUNTER — Inpatient Hospital Stay: Admission: RE | Admit: 2020-01-14 | Payer: 59 | Source: Ambulatory Visit

## 2020-01-15 ENCOUNTER — Encounter
Admission: RE | Admit: 2020-01-15 | Discharge: 2020-01-15 | Disposition: A | Discharge status: Home / Self Care | Payer: 59 | Source: Ambulatory Visit | Attending: Surgery | Admitting: Surgery

## 2020-01-15 ENCOUNTER — Other Ambulatory Visit
Admission: RE | Admit: 2020-01-15 | Discharge: 2020-01-15 | Disposition: A | Discharge status: Home / Self Care | Payer: 59 | Source: Ambulatory Visit | Attending: Surgery | Admitting: Surgery

## 2020-01-15 ENCOUNTER — Other Ambulatory Visit: Payer: Self-pay

## 2020-01-15 DIAGNOSIS — Z20822 Contact with and (suspected) exposure to covid-19: Secondary | ICD-10-CM | POA: Diagnosis not present

## 2020-01-15 DIAGNOSIS — Z01812 Encounter for preprocedural laboratory examination: Secondary | ICD-10-CM | POA: Insufficient documentation

## 2020-01-15 LAB — CBC WITH DIFFERENTIAL/PLATELET
Abs Immature Granulocytes: 0.01 10*3/uL (ref 0.00–0.07)
Basophils Absolute: 0.1 10*3/uL (ref 0.0–0.1)
Basophils Relative: 1 %
Eosinophils Absolute: 0.3 10*3/uL (ref 0.0–0.5)
Eosinophils Relative: 4 %
HCT: 37.3 % — ABNORMAL LOW (ref 39.0–52.0)
Hemoglobin: 12.8 g/dL — ABNORMAL LOW (ref 13.0–17.0)
Immature Granulocytes: 0 %
Lymphocytes Relative: 25 %
Lymphs Abs: 1.9 10*3/uL (ref 0.7–4.0)
MCH: 29.3 pg (ref 26.0–34.0)
MCHC: 34.3 g/dL (ref 30.0–36.0)
MCV: 85.4 fL (ref 80.0–100.0)
Monocytes Absolute: 0.5 10*3/uL (ref 0.1–1.0)
Monocytes Relative: 7 %
Neutro Abs: 4.8 10*3/uL (ref 1.7–7.7)
Neutrophils Relative %: 63 %
Platelets: 198 10*3/uL (ref 150–400)
RBC: 4.37 MIL/uL (ref 4.22–5.81)
RDW: 13.4 % (ref 11.5–15.5)
WBC: 7.6 10*3/uL (ref 4.0–10.5)
nRBC: 0 % (ref 0.0–0.2)

## 2020-01-15 LAB — COMPREHENSIVE METABOLIC PANEL
ALT: 19 U/L (ref 0–44)
AST: 14 U/L — ABNORMAL LOW (ref 15–41)
Albumin: 3.9 g/dL (ref 3.5–5.0)
Alkaline Phosphatase: 74 U/L (ref 38–126)
Anion gap: 9 (ref 5–15)
BUN: 31 mg/dL — ABNORMAL HIGH (ref 8–23)
CO2: 26 mmol/L (ref 22–32)
Calcium: 9.9 mg/dL (ref 8.9–10.3)
Chloride: 102 mmol/L (ref 98–111)
Creatinine, Ser: 1.13 mg/dL (ref 0.61–1.24)
GFR calc Af Amer: >60 mL/min (ref >60)
GFR calc non Af Amer: >60 mL/min (ref >60)
Glucose, Bld: 218 mg/dL — ABNORMAL HIGH (ref 70–99)
Potassium: 4.2 mmol/L (ref 3.5–5.1)
Sodium: 137 mmol/L (ref 135–145)
Total Bilirubin: 0.9 mg/dL (ref 0.3–1.2)
Total Protein: 7.5 g/dL (ref 6.5–8.1)

## 2020-01-15 LAB — SARS CORONAVIRUS 2 (TAT 6-24 HRS): SARS Coronavirus 2: NEGATIVE

## 2020-01-15 NOTE — Patient Instructions (Addendum)
Your procedure is scheduled on: 01-16-20 [facility] Report to Same Day Surgery 2nd floor medical mall [facility] Entrance-take elevator on left to 2nd floor.  Check in with surgery information desk.) To find out your arrival time please call [phone] between 1PM - 3PM on 01-15-20 TUESDAY  Remember: Instructions that are not followed completely may result in serious medical risk, up to and including death, or upon the discretion of your surgeon and anesthesiologist your surgery may need to be rescheduled.    _x___ 1. Do not eat food after midnight the night before your procedure. NO GUM OR CANDY AFTER MIDNIGHT. You may drink WATER liquids up to 2 hours before you are scheduled to arrive at the hospital for your procedure.  Do not drink WATER within 2 hours of your scheduled arrival to the hospital.  Type 1 and type 2 diabetics should only drink water.     __x__ 2. No Alcohol for 24 hours before or after surgery.   __x__3. No Smoking or e-cigarettes for 24 prior to surgery.  Do not use any chewable tobacco products for at least 6 hour prior to surgery   ____  4. Bring all medications with you on the day of surgery if instructed.    __x__ 5. Notify your doctor if there is any change in your medical condition     (cold, fever, infections).    x___6. On the morning of surgery brush your teeth with toothpaste and water.  You may rinse your mouth with mouth wash if you wish.  Do not swallow any toothpaste or mouthwash.   Do not wear jewelry, make-up, hairpins, clips or nail polish.  Do not wear lotions, powders, or perfumes.   Do not shave 48 hours prior to surgery. Men may shave face and neck.  Do not bring valuables to the hospital.    [facility] is not responsible for any belongings or valuables.               Contacts, dentures or bridgework may not be worn into surgery.  Leave your suitcase in the car. After surgery it may be brought to your room.  For patients admitted to the  hospital, discharge time is determined by your treatment team.  _  Patients discharged the day of surgery will not be allowed to drive home.  You will need someone to drive you home and stay with you the night of your procedure.    Please read over the following fact sheets that you were given:   [facility] Preparing for Surgery   _x___ TAKE THE FOLLOWING MEDICATION THE MORNING OF SURGERY WITH A SMALL SIP OF WATER. These include:  1. NORVASC (AMLODIPINE)  2. PRIMIDONE (MYSOLINE)  3.  4.  5.  6.  ____Fleets enema or Magnesium Citrate as directed.   _x___ Use CHG Soap or sage wipes as directed on instruction sheet   ____ Use inhalers on the day of surgery and bring to hospital day of surgery  ____ Stop Metformin and Janumet 2 days prior to surgery.    ____ Take 1/2 of usual insulin dose the night before surgery and none on the morning surgery.   ____ Follow recommendations from Cardiologist, Pulmonologist or PCP regarding stopping Aspirin, Coumadin, Plavix ,Eliquis, Effient, or Pradaxa, and Pletal.  X____Stop Anti-inflammatories such as Advil, Aleve, Ibuprofen, Motrin, Naproxen, Naprosyn, Goodies powders or aspirin products NOW-OK to take Tylenol    ____ Stop supplements until after surgery.  ____ Bring C-Pap to the hospital.

## 2020-01-16 ENCOUNTER — Other Ambulatory Visit: Payer: Self-pay

## 2020-01-16 ENCOUNTER — Ambulatory Visit: Payer: 59

## 2020-01-16 ENCOUNTER — Observation Stay
Admission: RE | Admit: 2020-01-16 | Discharge: 2020-01-17 | Disposition: A | Discharge status: Home / Self Care | Payer: 59 | Attending: Surgery | Admitting: Surgery

## 2020-01-16 ENCOUNTER — Encounter: Payer: Self-pay | Admitting: Surgery

## 2020-01-16 ENCOUNTER — Encounter
Admission: RE | Disposition: A | Discharge status: Home / Self Care | Payer: Self-pay | Source: Home / Self Care | Attending: Surgery

## 2020-01-16 DIAGNOSIS — K8012 Calculus of gallbladder with acute and chronic cholecystitis without obstruction: Principal | ICD-10-CM | POA: Insufficient documentation

## 2020-01-16 DIAGNOSIS — Z79899 Other long term (current) drug therapy: Secondary | ICD-10-CM | POA: Insufficient documentation

## 2020-01-16 DIAGNOSIS — K8010 Calculus of gallbladder with chronic cholecystitis without obstruction: Secondary | ICD-10-CM | POA: Diagnosis present

## 2020-01-16 DIAGNOSIS — K812 Acute cholecystitis with chronic cholecystitis: Secondary | ICD-10-CM | POA: Diagnosis not present

## 2020-01-16 DIAGNOSIS — Z87891 Personal history of nicotine dependence: Secondary | ICD-10-CM | POA: Insufficient documentation

## 2020-01-16 DIAGNOSIS — Z7982 Long term (current) use of aspirin: Secondary | ICD-10-CM | POA: Insufficient documentation

## 2020-01-16 DIAGNOSIS — I10 Essential (primary) hypertension: Secondary | ICD-10-CM | POA: Insufficient documentation

## 2020-01-16 DIAGNOSIS — K750 Abscess of liver: Secondary | ICD-10-CM | POA: Insufficient documentation

## 2020-01-16 DIAGNOSIS — E119 Type 2 diabetes mellitus without complications: Secondary | ICD-10-CM | POA: Insufficient documentation

## 2020-01-16 LAB — GLUCOSE, CAPILLARY
Glucose-Capillary: 204 mg/dL — ABNORMAL HIGH (ref 70–99)
Glucose-Capillary: 206 mg/dL — ABNORMAL HIGH (ref 70–99)

## 2020-01-16 SURGERY — CHOLECYSTECTOMY, ROBOT-ASSISTED, LAPAROSCOPIC
Anesthesia: General | Site: Abdomen

## 2020-01-16 MED ORDER — BUPIVACAINE-EPINEPHRINE (PF) 0.25% -1:200000 IJ SOLN
INTRAMUSCULAR | Status: DC | PRN
  Administered 2020-01-16: 30 mL via PERINEURAL

## 2020-01-16 MED ORDER — ONDANSETRON HCL 4 MG/2ML IJ SOLN: 4.0000 mg | Freq: Once | INTRAMUSCULAR | Status: DC | PRN

## 2020-01-16 MED ORDER — LIDOCAINE HCL (PF) 2 % IJ SOLN
INTRAMUSCULAR | Status: AC
  Filled 2020-01-16: qty 5

## 2020-01-16 MED ORDER — ACETAMINOPHEN 500 MG PO TABS: 1000.0000 mg | ORAL_TABLET | ORAL | Status: AC

## 2020-01-16 MED ORDER — CHLORHEXIDINE GLUCONATE 0.12 % MT SOLN: 15.0000 mL | Freq: Once | OROMUCOSAL | Status: AC

## 2020-01-16 MED ORDER — BUPIVACAINE-EPINEPHRINE (PF) 0.25% -1:200000 IJ SOLN
INTRAMUSCULAR | Status: AC
  Filled 2020-01-16: qty 30

## 2020-01-16 MED ORDER — CELECOXIB 200 MG PO CAPS: 200.0000 mg | ORAL_CAPSULE | ORAL | Status: AC

## 2020-01-16 MED ORDER — ACETAMINOPHEN 500 MG PO TABS
ORAL_TABLET | ORAL | Status: AC
  Administered 2020-01-16: 1000 mg via ORAL
  Filled 2020-01-16: qty 2

## 2020-01-16 MED ORDER — LIDOCAINE HCL (CARDIAC) PF 100 MG/5ML IV SOSY
PREFILLED_SYRINGE | INTRAVENOUS | Status: DC | PRN
  Administered 2020-01-16: 100 mg via INTRAVENOUS

## 2020-01-16 MED ORDER — ONDANSETRON 4 MG PO TBDP
4.0000 mg | ORAL_TABLET | Freq: Four times a day (QID) | ORAL | Status: DC | PRN
  Filled 2020-01-16: qty 1

## 2020-01-16 MED ORDER — CEFAZOLIN SODIUM-DEXTROSE 2-4 GM/100ML-% IV SOLN
2.0000 g | INTRAVENOUS | Status: AC
  Administered 2020-01-16: 2 g via INTRAVENOUS

## 2020-01-16 MED ORDER — FENTANYL CITRATE (PF) 100 MCG/2ML IJ SOLN
25.0000 ug | INTRAMUSCULAR | Status: DC | PRN
  Administered 2020-01-16 (×3): 25 ug via INTRAVENOUS

## 2020-01-16 MED ORDER — INDOCYANINE GREEN 25 MG IV SOLR
2.5000 mg | Freq: Once | INTRAVENOUS | Status: AC
  Administered 2020-01-16: 2.5 mg via INTRAVENOUS
  Filled 2020-01-16: qty 10

## 2020-01-16 MED ORDER — PRIMIDONE 50 MG PO TABS
100.0000 mg | ORAL_TABLET | ORAL | Status: DC
  Administered 2020-01-17: 100 mg via ORAL
  Filled 2020-01-16: qty 2

## 2020-01-16 MED ORDER — FAMOTIDINE 20 MG PO TABS: 20.0000 mg | ORAL_TABLET | Freq: Once | ORAL | Status: AC

## 2020-01-16 MED ORDER — ONDANSETRON HCL 4 MG/2ML IJ SOLN: 4.0000 mg | Freq: Four times a day (QID) | INTRAMUSCULAR | Status: DC | PRN

## 2020-01-16 MED ORDER — AMLODIPINE BESYLATE 10 MG PO TABS
10.0000 mg | ORAL_TABLET | ORAL | Status: DC
  Administered 2020-01-17: 10 mg via ORAL
  Filled 2020-01-16: qty 1

## 2020-01-16 MED ORDER — ROCURONIUM BROMIDE 10 MG/ML (PF) SYRINGE
PREFILLED_SYRINGE | INTRAVENOUS | Status: AC
  Filled 2020-01-16: qty 10

## 2020-01-16 MED ORDER — BUPIVACAINE LIPOSOME 1.3 % IJ SUSP: 20.0000 mL | Freq: Once | INTRAMUSCULAR | Status: DC

## 2020-01-16 MED ORDER — ONDANSETRON HCL 4 MG/2ML IJ SOLN
INTRAMUSCULAR | Status: AC
  Filled 2020-01-16: qty 2

## 2020-01-16 MED ORDER — HYDROCODONE-ACETAMINOPHEN 5-325 MG PO TABS
1.0000 | ORAL_TABLET | ORAL | Status: DC | PRN
  Administered 2020-01-17 (×2): 2 via ORAL
  Filled 2020-01-16 (×2): qty 2

## 2020-01-16 MED ORDER — GABAPENTIN 300 MG PO CAPS: 300.0000 mg | ORAL_CAPSULE | ORAL | Status: AC

## 2020-01-16 MED ORDER — KETOROLAC TROMETHAMINE 30 MG/ML IJ SOLN
15.0000 mg | Freq: Four times a day (QID) | INTRAMUSCULAR | Status: DC | PRN
  Filled 2020-01-16: qty 1

## 2020-01-16 MED ORDER — GABAPENTIN 300 MG PO CAPS
ORAL_CAPSULE | ORAL | Status: AC
  Administered 2020-01-16: 300 mg via ORAL
  Filled 2020-01-16: qty 1

## 2020-01-16 MED ORDER — FENTANYL CITRATE (PF) 100 MCG/2ML IJ SOLN
INTRAMUSCULAR | Status: AC
  Administered 2020-01-16: 25 ug via INTRAVENOUS
  Filled 2020-01-16: qty 2

## 2020-01-16 MED ORDER — BUPIVACAINE LIPOSOME 1.3 % IJ SUSP
INTRAMUSCULAR | Status: AC
  Filled 2020-01-16: qty 20

## 2020-01-16 MED ORDER — CHLORHEXIDINE GLUCONATE 0.12 % MT SOLN
OROMUCOSAL | Status: AC
  Administered 2020-01-16: 15 mL via OROMUCOSAL
  Filled 2020-01-16: qty 15

## 2020-01-16 MED ORDER — EPHEDRINE SULFATE 50 MG/ML IJ SOLN
INTRAMUSCULAR | Status: DC | PRN
  Administered 2020-01-16 (×2): 10 mg via INTRAVENOUS

## 2020-01-16 MED ORDER — CHLORHEXIDINE GLUCONATE CLOTH 2 % EX PADS
6.0000 | MEDICATED_PAD | Freq: Once | CUTANEOUS | Status: AC
  Administered 2020-01-16: 6 via TOPICAL

## 2020-01-16 MED ORDER — PHENYLEPHRINE HCL (PRESSORS) 10 MG/ML IV SOLN
INTRAVENOUS | Status: DC | PRN
  Administered 2020-01-16 (×3): 100 ug via INTRAVENOUS

## 2020-01-16 MED ORDER — ORAL CARE MOUTH RINSE: 15.0000 mL | Freq: Once | OROMUCOSAL | Status: AC

## 2020-01-16 MED ORDER — ONDANSETRON HCL 4 MG/2ML IJ SOLN
INTRAMUSCULAR | Status: DC | PRN
  Administered 2020-01-16: 4 mg via INTRAVENOUS

## 2020-01-16 MED ORDER — FAMOTIDINE 20 MG PO TABS
ORAL_TABLET | ORAL | Status: AC
  Administered 2020-01-16: 20 mg via ORAL
  Filled 2020-01-16: qty 1

## 2020-01-16 MED ORDER — FENTANYL CITRATE (PF) 250 MCG/5ML IJ SOLN
INTRAMUSCULAR | Status: AC
  Filled 2020-01-16: qty 5

## 2020-01-16 MED ORDER — NITROGLYCERIN 0.4 MG SL SUBL: 0.4000 mg | SUBLINGUAL_TABLET | SUBLINGUAL | Status: DC | PRN

## 2020-01-16 MED ORDER — ROCURONIUM BROMIDE 100 MG/10ML IV SOLN
INTRAVENOUS | Status: DC | PRN
  Administered 2020-01-16 (×2): 20 mg via INTRAVENOUS
  Administered 2020-01-16: 50 mg via INTRAVENOUS
  Administered 2020-01-16: 10 mg via INTRAVENOUS

## 2020-01-16 MED ORDER — SUGAMMADEX SODIUM 200 MG/2ML IV SOLN
INTRAVENOUS | Status: DC | PRN
  Administered 2020-01-16: 200 mg via INTRAVENOUS

## 2020-01-16 MED ORDER — LACTATED RINGERS IV SOLN: INTRAVENOUS | Status: DC

## 2020-01-16 MED ORDER — CEFAZOLIN SODIUM-DEXTROSE 2-4 GM/100ML-% IV SOLN
2.0000 g | Freq: Three times a day (TID) | INTRAVENOUS | Status: AC
  Administered 2020-01-17: 2 g via INTRAVENOUS
  Filled 2020-01-16: qty 100

## 2020-01-16 MED ORDER — SODIUM CHLORIDE 0.9 % IV SOLN: INTRAVENOUS | Status: DC

## 2020-01-16 MED ORDER — KETOROLAC TROMETHAMINE 30 MG/ML IJ SOLN
15.0000 mg | Freq: Four times a day (QID) | INTRAMUSCULAR | Status: AC
  Filled 2020-01-16 (×2): qty 1

## 2020-01-16 MED ORDER — MORPHINE SULFATE (PF) 2 MG/ML IV SOLN
2.0000 mg | INTRAVENOUS | Status: DC | PRN
  Administered 2020-01-16 – 2020-01-17 (×2): 2 mg via INTRAVENOUS
  Filled 2020-01-16 (×2): qty 1

## 2020-01-16 MED ORDER — PROPOFOL 10 MG/ML IV BOLUS
INTRAVENOUS | Status: DC | PRN
  Administered 2020-01-16: 150 mg via INTRAVENOUS

## 2020-01-16 MED ORDER — EPHEDRINE 5 MG/ML INJ
INTRAVENOUS | Status: AC
  Filled 2020-01-16: qty 30

## 2020-01-16 MED ORDER — CELECOXIB 200 MG PO CAPS
ORAL_CAPSULE | ORAL | Status: AC
  Administered 2020-01-16: 200 mg via ORAL
  Filled 2020-01-16: qty 1

## 2020-01-16 MED ORDER — PROPOFOL 10 MG/ML IV BOLUS
INTRAVENOUS | Status: AC
  Filled 2020-01-16: qty 20

## 2020-01-16 MED ORDER — DEXAMETHASONE SODIUM PHOSPHATE 10 MG/ML IJ SOLN
INTRAMUSCULAR | Status: DC | PRN
  Administered 2020-01-16: 10 mg via INTRAVENOUS

## 2020-01-16 MED ORDER — FENTANYL CITRATE (PF) 100 MCG/2ML IJ SOLN
INTRAMUSCULAR | Status: DC | PRN
  Administered 2020-01-16 (×3): 50 ug via INTRAVENOUS
  Administered 2020-01-16 (×2): 25 ug via INTRAVENOUS

## 2020-01-16 MED ORDER — CEFAZOLIN SODIUM-DEXTROSE 2-4 GM/100ML-% IV SOLN
INTRAVENOUS | Status: AC
  Filled 2020-01-16: qty 100

## 2020-01-16 MED ORDER — DEXAMETHASONE SODIUM PHOSPHATE 10 MG/ML IJ SOLN
INTRAMUSCULAR | Status: AC
  Filled 2020-01-16: qty 1

## 2020-01-16 SURGICAL SUPPLY — 48 items
BAG INFUSER PRESSURE 100CC (MISCELLANEOUS) IMPLANT
BULB RESERV EVAC DRAIN JP 100C (MISCELLANEOUS) ×3 IMPLANT
CANISTER SUCT 1200ML W/VALVE (MISCELLANEOUS) ×3 IMPLANT
CHLORAPREP W/TINT 26 (MISCELLANEOUS) ×3 IMPLANT
CLIP VESOLOCK MED LG 6/CT (CLIP) ×3 IMPLANT
COVER TIP SHEARS 8 DVNC (MISCELLANEOUS) ×1 IMPLANT
COVER TIP SHEARS 8MM DA VINCI (MISCELLANEOUS) ×2
COVER WAND RF STERILE (DRAPES) ×3 IMPLANT
DECANTER SPIKE VIAL GLASS SM (MISCELLANEOUS) ×3 IMPLANT
DEFOGGER SCOPE WARMER CLEARIFY (MISCELLANEOUS) ×3 IMPLANT
DERMABOND ADVANCED (GAUZE/BANDAGES/DRESSINGS) ×2
DERMABOND ADVANCED .7 DNX12 (GAUZE/BANDAGES/DRESSINGS) ×1 IMPLANT
DRAIN CHANNEL JP 19F (MISCELLANEOUS) ×3 IMPLANT
DRAPE ARM DVNC X/XI (DISPOSABLE) ×4 IMPLANT
DRAPE COLUMN DVNC XI (DISPOSABLE) ×1 IMPLANT
DRAPE DA VINCI XI ARM (DISPOSABLE) ×8
DRAPE DA VINCI XI COLUMN (DISPOSABLE) ×2
GLOVE ORTHO TXT STRL SZ7.5 (GLOVE) ×6 IMPLANT
GOWN STRL REUS W/ TWL LRG LVL3 (GOWN DISPOSABLE) ×4 IMPLANT
GOWN STRL REUS W/TWL LRG LVL3 (GOWN DISPOSABLE) ×8
GRASPER SUT TROCAR 14GX15 (MISCELLANEOUS) ×3 IMPLANT
IRRIGATION STRYKERFLOW (MISCELLANEOUS) IMPLANT
IRRIGATOR STRYKERFLOW (MISCELLANEOUS)
IRRIGATOR SUCT 8 DISP DVNC XI (IRRIGATION / IRRIGATOR) ×1 IMPLANT
IRRIGATOR SUCTION 8MM XI DISP (IRRIGATION / IRRIGATOR) ×2
IV NS IRRIG 3000ML ARTHROMATIC (IV SOLUTION) ×3 IMPLANT
KIT PINK PAD W/HEAD ARE REST (MISCELLANEOUS) ×3
KIT PINK PAD W/HEAD ARM REST (MISCELLANEOUS) ×1 IMPLANT
KIT TURNOVER KIT A (KITS) ×3 IMPLANT
LABEL OR SOLS (LABEL) ×3 IMPLANT
NEEDLE HYPO 22GX1.5 SAFETY (NEEDLE) ×3 IMPLANT
NEEDLE INSUFFLATION 14GA 120MM (NEEDLE) ×3 IMPLANT
NS IRRIG 500ML POUR BTL (IV SOLUTION) ×3 IMPLANT
PACK LAP CHOLECYSTECTOMY (MISCELLANEOUS) ×3 IMPLANT
PENCIL ELECTRO HAND CTR (MISCELLANEOUS) ×3 IMPLANT
POUCH SPECIMEN RETRIEVAL 10MM (ENDOMECHANICALS) ×6 IMPLANT
SEAL CANN UNIV 5-8 DVNC XI (MISCELLANEOUS) ×4 IMPLANT
SEAL XI 5MM-8MM UNIVERSAL (MISCELLANEOUS) ×8
SET TUBE SMOKE EVAC HIGH FLOW (TUBING) ×3 IMPLANT
SOLUTION ELECTROLUBE (MISCELLANEOUS) ×3 IMPLANT
SUT ETHILON 3-0 FS-10 30 BLK (SUTURE) ×3
SUT MNCRL 4-0 (SUTURE) ×2
SUT MNCRL 4-0 27XMFL (SUTURE) ×1
SUT VICRYL 0 AB UR-6 (SUTURE) ×3 IMPLANT
SUT VICRYL+ 3-0 27IN RB-1 (SUTURE) ×3 IMPLANT
SUTURE EHLN 3-0 FS-10 30 BLK (SUTURE) ×1 IMPLANT
SUTURE MNCRL 4-0 27XMF (SUTURE) ×1 IMPLANT
TROCAR Z-THREAD FIOS 11X100 BL (TROCAR) ×3 IMPLANT

## 2020-01-16 NOTE — Interval H&P Note (Signed)
History and Physical Interval Note:  01/16/2020 2:32 PM  Kyle Ritter  has presented today for surgery, with the diagnosis of Chronic calculous cholecystectomy.  The various methods of treatment have been discussed with the patient and family. After consideration of risks, benefits and other options for treatment, the patient has consented to  Procedure(s): XI ROBOTIC ASSISTED LAPAROSCOPIC CHOLECYSTECTOMY (N/A) INDOCYANINE GREEN FLUORESCENCE IMAGING (ICG) (N/A) as a surgical intervention.  The patient's history has been reviewed, patient examined, no change in status, stable for surgery.  I have reviewed the patient's chart and labs.  Questions were answered to the patient's satisfaction.     Campbell Lerner, M.D., [facility]  01/16/2020 ; 2:32 PM

## 2020-01-16 NOTE — Transfer of Care (Signed)
Immediate Anesthesia Transfer of Care Note  Patient: Kyle Ritter  Procedure(s) Performed: XI ROBOTIC ASSISTED LAPAROSCOPIC CHOLECYSTECTOMY (N/A Abdomen) INDOCYANINE GREEN FLUORESCENCE IMAGING (ICG) (N/A Abdomen)  Patient Location: PACU  Anesthesia Type:General  Level of Consciousness: awake, alert  and patient cooperative  Airway & Oxygen Therapy: Patient Spontanous Breathing and Patient connected to face mask oxygen  Post-op Assessment: Report given to RN and Post -op Vital signs reviewed and stable  Post vital signs: Reviewed and stable  Last Vitals:  Vitals Value Taken Time  BP 176/78 01/16/20 1834  Temp 36.5 C 01/16/20 1834  Pulse 78 01/16/20 1834  Resp 12 01/16/20 1834  SpO2 100 % 01/16/20 1834    Last Pain:  Vitals:   01/16/20 1834  TempSrc:   PainSc: 0-No pain         Complications: No complications documented.

## 2020-01-16 NOTE — Discharge Instructions (Signed)
AMBULATORY SURGERY  °DISCHARGE INSTRUCTIONS ° ° °1) The drugs that you were given will stay in your system until tomorrow so for the next 24 hours you should not: ° °A) Drive an automobile °B) Make any legal decisions °C) Drink any alcoholic beverage ° ° °2) You may resume regular meals tomorrow.  Today it is better to start with liquids and gradually work up to solid foods. ° °You may eat anything you prefer, but it is better to start with liquids, then soup and crackers, and gradually work up to solid foods. ° ° °3) Please notify your doctor immediately if you have any unusual bleeding, trouble breathing, redness and pain at the surgery site, drainage, fever, or pain not relieved by medication. ° ° ° °4) Additional Instructions: ° ° ° ° ° ° ° °Please contact your physician with any problems or Same Day Surgery at [phone], Monday through Friday 6 am to 4 pm, or [place] [place] Main number at [phone].AMBULATORY SURGERY  °DISCHARGE INSTRUCTIONS ° ° °5) The drugs that you were given will stay in your system until tomorrow so for the next 24 hours you should not: ° °D) Drive an automobile °E) Make any legal decisions °F) Drink any alcoholic beverage ° ° °6) You may resume regular meals tomorrow.  Today it is better to start with liquids and gradually work up to solid foods. ° °You may eat anything you prefer, but it is better to start with liquids, then soup and crackers, and gradually work up to solid foods. ° ° °7) Please notify your doctor immediately if you have any unusual bleeding, trouble breathing, redness and pain at the surgery site, drainage, fever, or pain not relieved by medication. ° ° ° °8) Additional Instructions: ° ° ° ° ° ° ° °Please contact your physician with any problems or Same Day Surgery at [phone], Monday through Friday 6 am to 4 pm, or [facility] [place] Main number at [phone]. °

## 2020-01-16 NOTE — Op Note (Signed)
Robotic cholecystectomy  Pre-operative Diagnosis: Chronic calculus cholecystitis, history of acute with adjacent hepatic abscess treated with percutaneous drainage.  Post-operative Diagnosis:  Same.  Procedure: Robotic assisted laparoscopic cholecystectomy.  Surgeon: Campbell Lerner, M.D., FACS  Anesthesia: General. with endotracheal tube  Findings: Extensive scarring involving the body and infundibulum of gallbladder.  Long gallbladder with multiple septations of gallbladder filled with stones.  Gallbladder transected at what is believed to be the neck, closed with suture, and drain placed adjacent.  Estimated Blood Loss: 20 mL         Drains: None         Specimens: Gallbladder           Complications: none  Procedure Details  The patient was seen again in the Holding Room.  2.5 mg dose of ICG was administered intravenously.  The benefits, complications, treatment options, risks and expected outcomes were discussed with the patient. The likelihood of improving the patient's symptoms with return to their baseline status is good.  The patient and/or family concurred with the proposed plan, giving informed consent, again alternatives reviewed.  The patient was taken to Operating Room, identified, and the procedure verified as robotic assisted laparoscopic cholecystectomy.  Prior to the induction of general anesthesia, antibiotic prophylaxis was administered. VTE prophylaxis was in place. General endotracheal anesthesia was then administered and tolerated well. The patient was positioned in the supine position.  I cut the drain off at the level of the skin prior to prepping.  After the induction, the abdomen was prepped with Chloraprep and draped in the sterile fashion.  A Time Out was held and the above information confirmed.  Right infra-umbilical local infiltration with quarter percent Marcaine with epinephrine is utilized.  Made a 12 mm incision on the left periumbilical site, I  advanced an optical 30mm port under direct visualization into the peritoneal cavity.  Once the peritoneum was penetrated, insufflation was transferred.  The trocar was then advanced into the abdominal cavity under direct visualization. Pneumoperitoneum was then continued with CO2 at 14 mmHg or less and tolerated well without any adverse changes in the patient's vital signs.  Two 8.5-mm ports were placed in the left lower quadrant and laterally, and one to the right lower quadrant, all under direct vision. All skin incisions  were infiltrated with a local anesthetic agent before making the incision and placing the trocars.  The patient was positioned  in reverse Trendelenburg, tilted the patient's left side down.  Da Vinci XI robot was then positioned on to the patient's left side, and docked.  The gallbladder was identified, extensive scarring along the body of the gallbladder were progressively lysed to expose the gallbladder.  Due to difficulty identifying any progression towards the infundibulum, I opened the fundus and extracted stones to identify/confirm the location of the lumen and the extent of the gallbladder that remained within the scarring.  The fundus grasped via the arm 4 Prograsp and retracted cephalad. Adhesions were lysed with scissors with cautery and blunt dissection with the suction irrigator.  There were multiple areas where I felt we were at infundibulum, however the scarring did not appear to change, so I opened the infundibulum's that were perceived extracted more stones and continued the progression toward what I felt was the true infundibulum.  The infundibulum was identified grasped and retracted laterally, exposing the peritoneum overlying the triangle of Calot.  Firefly did not help me identify any of the common duct system, and due to the open gallbladder the  nature of bilious drainage I was not able to be assured at what the local structures were.  So I continued dissection by  confirming gallbladder lumen.  I finally reached a point where it appeared that the gallbladder lumen was approaching a structure consistent with the common duct and I felt it was wise at this point to restrain any further dissection.  The gallbladder neck, so I presume, was then opened and dissected using cautery & scissors.  With complete assurance that all the stones were extracted and removed, I proceeded with closure of I believe to be the gallbladder neck/cystic duct transition.  This was done with 3-0 Vicryl.  The gallbladder was taken from the gallbladder fossa in a retrograde fashion with the electrocautery.  Portions of the back wall were left attached to the liver and these were heavily cauterized.  The gallbladder was removed and placed in an Endocatch bag.  A second Endo Catch bag was utilized for all the stones were extracted during the procedure. The liver bed was irrigated and inspected. Hemostasis was achieved with the electrocautery.  The robot was undocked and moved away from the operative field. Copious irrigation was utilized and was repeatedly aspirated until clear.  The gallbladder and Endocatch sac were then removed through the infraumbilical port site.   Inspection of the right upper quadrant was performed. No bleeding, bile duct injury or leak, or bowel injury was noted.  Placed a 93 Blake drain in the gallbladder fossa and secured it out the right lower quadrant port site.  The infra-umbilical port site fascia was closed with interrumpted 0 Vicryl sutures using PMI/cone under direct visualization. Pneumoperitoneum was released and ports removed.  4-0 subcuticular Monocryl was used to close the skin. Dermabond was  applied.  The patient was then extubated and brought to the recovery room in stable condition. Sponge, lap, and needle counts were correct at closure and at the conclusion of the case.               Ronny Bacon, M.D., [facility] 01/16/2020 6:26 PM

## 2020-01-16 NOTE — Anesthesia Postprocedure Evaluation (Signed)
Anesthesia Post Note  Patient: Kyle Ritter  Procedure(s) Performed: XI ROBOTIC ASSISTED LAPAROSCOPIC CHOLECYSTECTOMY (N/A Abdomen) INDOCYANINE GREEN FLUORESCENCE IMAGING (ICG) (N/A Abdomen)  Patient location during evaluation: PACU Anesthesia Type: General Level of consciousness: awake and alert Pain management: pain level controlled Vital Signs Assessment: post-procedure vital signs reviewed and stable Respiratory status: spontaneous breathing, nonlabored ventilation, respiratory function stable and patient connected to nasal cannula oxygen Cardiovascular status: blood pressure returned to baseline and stable Postop Assessment: no apparent nausea or vomiting Anesthetic complications: no   No complications documented.   Last Vitals:  Vitals:   01/16/20 2019 01/16/20 2144  BP: 133/83 130/77  Pulse: 80 92  Resp: 11 18  Temp: 36.6 C 36.4 C  SpO2: 96% 98%    Last Pain:  Vitals:   01/16/20 2144  TempSrc: Oral  PainSc:                  Corinda Gubler

## 2020-01-16 NOTE — Anesthesia Preprocedure Evaluation (Addendum)
Anesthesia Evaluation  Patient identified by MRN, date of birth, ID band Patient awake    Reviewed: Allergy & Precautions, NPO status , Patient's Chart, lab work & pertinent test results  Airway Mallampati: III  TM Distance: >3 FB     Dental  (+) Upper Dentures, Lower Dentures   Pulmonary neg pulmonary ROS,    Pulmonary exam normal        Cardiovascular hypertension, Normal cardiovascular exam     Neuro/Psych Hx of brain aneurism negative psych ROS   GI/Hepatic negative GI ROS, Neg liver ROS,   Endo/Other  diabetes  Renal/GU negative Renal ROS  negative genitourinary   Musculoskeletal negative musculoskeletal ROS (+)   Abdominal Normal abdominal exam  (+)   Peds negative pediatric ROS (+)  Hematology negative hematology ROS (+)   Anesthesia Other Findings Past Medical History: No date: Brain aneurysm No date: Diabetes mellitus without complication (HCC)     Comment:  no meds No date: Hypertension  Reproductive/Obstetrics                            Anesthesia Physical Anesthesia Plan  ASA: III  Anesthesia Plan: General   Post-op Pain Management:    Induction: Intravenous  PONV Risk Score and Plan:   Airway Management Planned: Oral ETT  Additional Equipment:   Intra-op Plan:   Post-operative Plan: Extubation in OR  Informed Consent: I have reviewed the patients History and Physical, chart, labs and discussed the procedure including the risks, benefits and alternatives for the proposed anesthesia with the patient or authorized representative who has indicated his/her understanding and acceptance.     Dental advisory given  Plan Discussed with: CRNA and Surgeon  Anesthesia Plan Comments:         Anesthesia Quick Evaluation

## 2020-01-16 NOTE — Anesthesia Procedure Notes (Addendum)
Procedure Name: Intubation Date/Time: 01/16/2020 3:25 PM Performed by: Doreen Salvage, CRNA Pre-anesthesia Checklist: Patient identified, Patient being monitored, Timeout performed, Emergency Drugs available and Suction available Patient Re-evaluated:Patient Re-evaluated prior to induction Oxygen Delivery Method: Circle system utilized Preoxygenation: Pre-oxygenation with 100% oxygen Induction Type: IV induction Ventilation: Mask ventilation without difficulty Laryngoscope Size: Mac and 3 Grade View: Grade I Tube type: Oral Tube size: 7.5 mm Number of attempts: 1 Airway Equipment and Method: Stylet Placement Confirmation: ETT inserted through vocal cords under direct vision,  positive ETCO2 and breath sounds checked- equal and bilateral Secured at: 23 cm Tube secured with: Tape Dental Injury: Teeth and Oropharynx as per pre-operative assessment

## 2020-01-17 DIAGNOSIS — K8012 Calculus of gallbladder with acute and chronic cholecystitis without obstruction: Secondary | ICD-10-CM | POA: Diagnosis not present

## 2020-01-17 LAB — COMPREHENSIVE METABOLIC PANEL
ALT: 32 U/L (ref 0–44)
AST: 38 U/L (ref 15–41)
Albumin: 3.3 g/dL — ABNORMAL LOW (ref 3.5–5.0)
Alkaline Phosphatase: 65 U/L (ref 38–126)
Anion gap: 8 (ref 5–15)
BUN: 18 mg/dL (ref 8–23)
CO2: 27 mmol/L (ref 22–32)
Calcium: 8.8 mg/dL — ABNORMAL LOW (ref 8.9–10.3)
Chloride: 102 mmol/L (ref 98–111)
Creatinine, Ser: 1.11 mg/dL (ref 0.61–1.24)
GFR calc Af Amer: >60 mL/min (ref >60)
GFR calc non Af Amer: >60 mL/min (ref >60)
Glucose, Bld: 202 mg/dL — ABNORMAL HIGH (ref 70–99)
Potassium: 4.4 mmol/L (ref 3.5–5.1)
Sodium: 137 mmol/L (ref 135–145)
Total Bilirubin: 0.9 mg/dL (ref 0.3–1.2)
Total Protein: 6.8 g/dL (ref 6.5–8.1)

## 2020-01-17 LAB — CBC
HCT: 36.1 % — ABNORMAL LOW (ref 39.0–52.0)
Hemoglobin: 11.9 g/dL — ABNORMAL LOW (ref 13.0–17.0)
MCH: 29 pg (ref 26.0–34.0)
MCHC: 33 g/dL (ref 30.0–36.0)
MCV: 88 fL (ref 80.0–100.0)
Platelets: 174 10*3/uL (ref 150–400)
RBC: 4.1 MIL/uL — ABNORMAL LOW (ref 4.22–5.81)
RDW: 13.3 % (ref 11.5–15.5)
WBC: 12.4 10*3/uL — ABNORMAL HIGH (ref 4.0–10.5)
nRBC: 0 % (ref 0.0–0.2)

## 2020-01-17 MED ORDER — HYDROCODONE-ACETAMINOPHEN 5-325 MG PO TABS: 1.0000 | ORAL_TABLET | ORAL | 0 refills | Status: AC | PRN

## 2020-01-17 MED ORDER — IBUPROFEN 800 MG PO TABS: 800.0000 mg | ORAL_TABLET | Freq: Three times a day (TID) | ORAL | 0 refills | Status: AC | PRN

## 2020-01-17 NOTE — Discharge Summary (Signed)
[facility] SURGICAL DISCHARGE SUMMARY  Patient ID: [name] MRN: [id number] DOB/AGE: Jan 16, 1946 74 y.o.  Admit date: 01/16/2020 Discharge date: 01/17/2020  Discharge Diagnoses Patient Active Problem List   Diagnosis Date Noted  . Chronic cholecystitis with calculus 01/16/2020  . Abscess   . Bacteremia   . Acute on chronic cholecystitis 10/31/2019   Consultants None  Procedures 01/16/2020:  Robotic assisted laparoscopic cholecystectomy  HPI: Kyle Ritter is a 74 y.o. male with prior percutaneous tube placed for sepsis secondary to likely perforated cholecystitis and secondary adjacent hepatic abscess who [facility] to [facility] on 01/16/2020 for scheduled robotic assisted laparoscopic cholecystectomy.  Hospital Course: Informed consent was obtained and documented, and patient underwent uneventful robotic assisted laparoscopic cholecystectomy (Dr Claudine Mouton, MD,  01/16/2020).  Post-operatively, patient's pain/symptoms improved/resolved and advancement of patient's diet and ambulation were well-tolerated. The remainder of patient's hospital course was essentially unremarkable, and discharge planning was initiated accordingly with patient safely able to be discharged home with appropriate discharge instructions, pain control, and outpatient follow-up after all of his questions were answered to his expressed satisfaction.   Discharge Condition: Good   Physical Examination:  Constitutional: Well appearing male, NAD Pulmonary: Normal effort, no respiratory distress Gastrointestinal: Soft, mild incisional soreness, non-distended, no rebound/guarding, JP in the RLQ with serosanguinous drainage Skin: Laparoscopic incisions are CDI with dermabond, no erythema or drainage    Allergies as of 01/17/2020   No Known Allergies     Medication List    TAKE these medications   amLODipine 10 MG tablet Commonly known as: NORVASC Take 1 tablet (10 mg total) by mouth daily. What  changed: when to take this   HYDROcodone-acetaminophen 5-325 MG tablet Commonly known as: NORCO/VICODIN Take 1 tablet by mouth every 4 (four) hours as needed for moderate pain or severe pain.   ibuprofen 800 MG tablet Commonly known as: ADVIL Take 1 tablet (800 mg total) by mouth every 8 (eight) hours as needed.   multivitamin with minerals Tabs tablet Take 1 tablet by mouth daily.   nitroGLYCERIN 0.4 MG SL tablet Commonly known as: NITROSTAT Place 0.4 mg under the tongue every 5 (five) minutes as needed for chest pain.   primidone 50 MG tablet Commonly known as: MYSOLINE Take 100 mg by mouth every morning.   PROSTATE PO Take 20 mg by mouth daily.         Follow-up Information    Campbell Lerner, MD. Schedule an appointment as soon as possible for a visit on 01/22/2020.   Specialty: General Surgery Why: s/p robotic cholecystectomy, has drain  Contact information: [address] [place] [address] [phone]                Time spent on discharge management including discussion of hospital course, clinical condition, outpatient instructions, prescriptions, and follow up with the patient and members of the medical team: >30 minutes  -- Lynden Oxford , PA-C [facility]  01/17/2020, 10:42 AM [phone] M-F: 7am - 4pm

## 2020-01-17 NOTE — Progress Notes (Signed)
Patient discharged via private vehicle. Discharge instructions explained and given to patient. Patient and family member taught how to care for and empty JP drain. Patient and family member both verbalized understanding of how to care for drain. No further questions at this time. No s/s of distress noted. IVs removed.

## 2020-01-18 LAB — SURGICAL PATHOLOGY

## 2020-01-22 ENCOUNTER — Ambulatory Visit (INDEPENDENT_AMBULATORY_CARE_PROVIDER_SITE_OTHER): Payer: Self-pay | Admitting: Physician Assistant

## 2020-01-22 ENCOUNTER — Other Ambulatory Visit: Payer: Self-pay

## 2020-01-22 ENCOUNTER — Encounter: Payer: Self-pay | Admitting: Physician Assistant

## 2020-01-22 VITALS — BP 158/80 | HR 69 | Temp 98.6°F | Ht 71.0 in | Wt 176.8 lb

## 2020-01-22 DIAGNOSIS — Z09 Encounter for follow-up examination after completed treatment for conditions other than malignant neoplasm: Secondary | ICD-10-CM

## 2020-01-22 DIAGNOSIS — K8010 Calculus of gallbladder with chronic cholecystitis without obstruction: Secondary | ICD-10-CM

## 2020-01-22 NOTE — Patient Instructions (Signed)
Patient had drain removed at today's visit.   Patient is advised to keep the film over wound for two days. Patient can get the film wet.  After film removed, patient may use to bandage to cover the wound.   Patient may take Tylenol or Ibuprofen for any pain or discomfort. Patient may refrain from heavy lifting.

## 2020-01-22 NOTE — Progress Notes (Signed)
[facility] POST-OP OFFICE VISIT  01/22/2020  HPI: Kyle Ritter is a 74 y.o. male 6 days s/p robotic assisted laparoscopic cholecystectomy for [facility] with Dr Claudine Mouton.   Today he reports that he is feeling better each day He has some very intermittent pains in his RUQ, these tend to resolve spontaneously, and he is only reserving narcotic pain medications for night time No fever, chills, nausea, emesis, or diarrhea Tolerating PO Surgical drain output has been <15 ccs daily and serous Mobilizing well  Vital signs: BP (!) 158/80   Pulse 69   Temp 98.6 F (37 C) (Temporal)   Ht 5\' 11"  (1.803 m)   Wt 176 lb 12.8 oz (80.2 kg)   SpO2 98%   BMI 24.66 kg/m    Physical Exam: Constitutional: Well appearing male, NAD Abdomen: Soft, non-tender, non-distended, no rebound/guarding. JP drain in RLQ with minimal serous output Skin: Laparoscopic incisions are CDI with dermabond, some ecchymosis, no erythema or drainage   Assessment/Plan: This is a 74 y.o. male 6 days s/p robotic assisted laparoscopic cholecystectomy   - I removed his drain at bedside and placed occlusive dressing; wear this for 48 hours prior to removal  - Reviewed wound care  - Reviewed lifting restrictions; light activity okay   - Reviewed pathology: Acute and chronic cholecystitis, negative for malignancy  - rtc in 2 weeks for final re-check   -- 59, PA-C [facility] 01/22/2020, 9:58 AM [phone] M-F: 7am - 4pm

## 2020-02-05 ENCOUNTER — Ambulatory Visit (INDEPENDENT_AMBULATORY_CARE_PROVIDER_SITE_OTHER): Payer: Medicare Other | Admitting: Physician Assistant

## 2020-02-05 ENCOUNTER — Encounter: Payer: Self-pay | Admitting: Physician Assistant

## 2020-02-05 ENCOUNTER — Other Ambulatory Visit: Payer: Self-pay

## 2020-02-05 VITALS — BP 190/82 | HR 60 | Temp 98.1°F | Ht 71.0 in | Wt 178.8 lb

## 2020-02-05 DIAGNOSIS — Z09 Encounter for follow-up examination after completed treatment for conditions other than malignant neoplasm: Secondary | ICD-10-CM

## 2020-02-05 DIAGNOSIS — K8010 Calculus of gallbladder with chronic cholecystitis without obstruction: Secondary | ICD-10-CM

## 2020-02-05 NOTE — Progress Notes (Signed)
[facility] POST-OP OFFICE VISIT  02/05/2020  HPI: Kyle Ritter is a 74 y.o. male 21 days s/p robotic assisted laparoscopic cholecystectomy for [facility] with Dr Claudine Mouton  Doing very well No abdominal pain ,fever, chills, nausea, emesis, or diarrhea Tolerating PO Having bowel function Mobilizing well, anxious to return to golfing  Vital signs: BP (!) 190/82   Pulse 60   Temp 98.1 F (36.7 C) (Oral)   Ht 5\' 11"  (1.803 m)   Wt 178 lb 12.8 oz (81.1 kg)   SpO2 97%   BMI 24.94 kg/m    Physical Exam: Constitutional: Well appearing male, NAD Abdomen: Soft, non-tender, non-distended, no rebound/guarding.  Skin: Laparoscopic incisions are well healed.  Assessment/Plan: This is a 74 y.o. male 21 days s/p robotic assisted laparoscopic cholecystectomy   - No new issues  - He can be off lifting restrictions on Monday  - rtc prn  -- Sunday, PA-C [facility] 02/05/2020, 10:06 AM [phone] M-F: 7am - 4pm

## 2020-02-05 NOTE — Patient Instructions (Addendum)
Laqueta Due, PA discussed with patient he may return to [place] on Monday. Follow-up with our office as needed.  Please call and ask to speak with a nurse if you develop questions or concerns.   GENERAL POST-OPERATIVE PATIENT INSTRUCTIONS   WOUND CARE INSTRUCTIONS:  Keep a dry clean dressing on the wound if there is drainage. The initial bandage may be removed after 24 hours.  Once the wound has quit draining you may leave it open to air.  If clothing rubs against the wound or causes irritation and the wound is not draining you may cover it with a dry dressing during the daytime.  Try to keep the wound dry and avoid ointments on the wound unless directed to do so.  If the wound becomes bright red and painful or starts to drain infected material that is not clear, please contact your physician immediately.  If the wound is mildly pink and has a thick firm ridge underneath it, this is normal, and is referred to as a healing ridge.  This will resolve over the next 4-6 weeks.  BATHING: You may shower if you have been informed of this by your surgeon. However, Please do not submerge in a tub, hot tub, or pool until incisions are completely sealed or have been told by your surgeon that you may do so.  DIET:  You may eat any foods that you can tolerate.  It is a good idea to eat a high fiber diet and take in plenty of fluids to prevent constipation.  If you do become constipated you may want to take a mild laxative or take ducolax tablets on a daily basis until your bowel habits are regular.  Constipation can be very uncomfortable, along with straining, after recent surgery.  ACTIVITY:  You are encouraged to cough and deep breath or use your incentive spirometer if you were given one, every 15-30 minutes when awake.  This will help prevent respiratory complications and low grade fevers post-operatively if you had a general anesthetic.  You may want to hug a pillow when coughing and sneezing to add additional  support to the surgical area, if you had abdominal or chest surgery, which will decrease pain during these times.  You are encouraged to walk and engage in light activity for the next two weeks.  You should not lift more than 20 pounds, until 4-6 weeks after surgery as it could put you at increased risk for complications.  Twenty pounds is roughly equivalent to a plastic bag of groceries. At that time- Listen to your body when lifting, if you have pain when lifting, stop and then try again in a few days. Soreness after doing exercises or activities of daily living is normal as you get back in to your normal routine.  MEDICATIONS:  Try to take narcotic medications and anti-inflammatory medications, such as tylenol, ibuprofen, naprosyn, etc., with food.  This will minimize stomach upset from the medication.  Should you develop nausea and vomiting from the pain medication, or develop a rash, please discontinue the medication and contact your physician.  You should not drive, make important decisions, or operate machinery when taking narcotic pain medication.  SUNBLOCK Use sun block to incision area over the next year if this area will be exposed to sun. This helps decrease scarring and will allow you avoid a permanent darkened area over your incision.  QUESTIONS:  Please feel free to call our office if you have any questions, and we will be  glad to assist you.

## 2020-03-24 MED FILL — Morphine Sulfate IV Soln PF 2 MG/ML: INTRAVENOUS | Qty: 1 | Status: AC

## 2020-03-24 MED FILL — Hydrocodone-Acetaminophen Tab 5-325 MG: ORAL | Qty: 2 | Status: AC

## 2020-03-24 MED FILL — Amlodipine Besylate Tab 10 MG (Base Equivalent): ORAL | Qty: 10 | Status: AC

## 2020-09-25 ENCOUNTER — Emergency Department: Payer: Self-pay

## 2020-09-25 ENCOUNTER — Emergency Department
Admission: EM | Admit: 2020-09-25 | Discharge: 2020-09-25 | Disposition: A | Discharge status: Home / Self Care | Payer: Self-pay | Attending: Student in an Organized Health Care Education/Training Program | Admitting: Student in an Organized Health Care Education/Training Program

## 2020-09-25 ENCOUNTER — Other Ambulatory Visit: Payer: Self-pay

## 2020-09-25 ENCOUNTER — Encounter: Payer: Self-pay | Admitting: Emergency Medicine

## 2020-09-25 DIAGNOSIS — S0181XA Laceration without foreign body of other part of head, initial encounter: Secondary | ICD-10-CM

## 2020-09-25 DIAGNOSIS — R739 Hyperglycemia, unspecified: Secondary | ICD-10-CM

## 2020-09-25 DIAGNOSIS — Z23 Encounter for immunization: Secondary | ICD-10-CM | POA: Insufficient documentation

## 2020-09-25 DIAGNOSIS — S0990XA Unspecified injury of head, initial encounter: Secondary | ICD-10-CM

## 2020-09-25 DIAGNOSIS — I10 Essential (primary) hypertension: Secondary | ICD-10-CM | POA: Insufficient documentation

## 2020-09-25 DIAGNOSIS — E119 Type 2 diabetes mellitus without complications: Secondary | ICD-10-CM | POA: Insufficient documentation

## 2020-09-25 DIAGNOSIS — Z79899 Other long term (current) drug therapy: Secondary | ICD-10-CM | POA: Insufficient documentation

## 2020-09-25 DIAGNOSIS — Y92410 Unspecified street and highway as the place of occurrence of the external cause: Secondary | ICD-10-CM | POA: Insufficient documentation

## 2020-09-25 LAB — CBC
HCT: 38.4 % — ABNORMAL LOW (ref 39.0–52.0)
Hemoglobin: 13.2 g/dL (ref 13.0–17.0)
MCH: 29.5 pg (ref 26.0–34.0)
MCHC: 34.4 g/dL (ref 30.0–36.0)
MCV: 85.9 fL (ref 80.0–100.0)
Platelets: 198 10*3/uL (ref 150–400)
RBC: 4.47 MIL/uL (ref 4.22–5.81)
RDW: 13 % (ref 11.5–15.5)
WBC: 9 10*3/uL (ref 4.0–10.5)
nRBC: 0 % (ref 0.0–0.2)

## 2020-09-25 LAB — TROPONIN I (HIGH SENSITIVITY)
Troponin I (High Sensitivity): 19 ng/L — ABNORMAL HIGH (ref <18)
Troponin I (High Sensitivity): 33 ng/L — ABNORMAL HIGH (ref <18)

## 2020-09-25 LAB — ETHANOL: Alcohol, Ethyl (B): <10 mg/dL (ref <10)

## 2020-09-25 LAB — CBG MONITORING, ED: Glucose-Capillary: 501 mg/dL (ref 70–99)

## 2020-09-25 LAB — BASIC METABOLIC PANEL
Anion gap: 11 (ref 5–15)
BUN: 33 mg/dL — ABNORMAL HIGH (ref 8–23)
CO2: 23 mmol/L (ref 22–32)
Calcium: 9.8 mg/dL (ref 8.9–10.3)
Chloride: 99 mmol/L (ref 98–111)
Creatinine, Ser: 1.67 mg/dL — ABNORMAL HIGH (ref 0.61–1.24)
GFR, Estimated: 42 mL/min — ABNORMAL LOW (ref >60)
Glucose, Bld: 509 mg/dL (ref 70–99)
Potassium: 4.6 mmol/L (ref 3.5–5.1)
Sodium: 133 mmol/L — ABNORMAL LOW (ref 135–145)

## 2020-09-25 MED ORDER — AMLODIPINE BESYLATE 5 MG PO TABS
10.0000 mg | ORAL_TABLET | Freq: Once | ORAL | Status: AC
  Administered 2020-09-25: 10 mg via ORAL
  Filled 2020-09-25: qty 2

## 2020-09-25 MED ORDER — AMLODIPINE BESYLATE 10 MG PO TABS: 10.0000 mg | ORAL_TABLET | Freq: Every day | ORAL | 1 refills | Status: AC

## 2020-09-25 MED ORDER — LIDOCAINE-EPINEPHRINE (PF) 2 %-1:200000 IJ SOLN
20.0000 mL | Freq: Once | INTRAMUSCULAR | Status: AC
  Administered 2020-09-25: 20 mL
  Filled 2020-09-25: qty 20

## 2020-09-25 MED ORDER — INSULIN ASPART 100 UNIT/ML ~~LOC~~ SOLN
6.0000 [IU] | Freq: Once | SUBCUTANEOUS | Status: AC
  Administered 2020-09-25: 6 [IU] via INTRAVENOUS
  Filled 2020-09-25: qty 1

## 2020-09-25 MED ORDER — SODIUM CHLORIDE 0.9 % IV BOLUS
1000.0000 mL | Freq: Once | INTRAVENOUS | Status: AC
  Administered 2020-09-25: 1000 mL via INTRAVENOUS

## 2020-09-25 MED ORDER — LABETALOL HCL 5 MG/ML IV SOLN
20.0000 mg | Freq: Once | INTRAVENOUS | Status: AC
  Administered 2020-09-25: 20 mg via INTRAVENOUS
  Filled 2020-09-25: qty 4

## 2020-09-25 MED ORDER — TETANUS-DIPHTH-ACELL PERTUSSIS 5-2.5-18.5 LF-MCG/0.5 IM SUSY
0.5000 mL | PREFILLED_SYRINGE | Freq: Once | INTRAMUSCULAR | Status: AC
  Administered 2020-09-25: 0.5 mL via INTRAMUSCULAR
  Filled 2020-09-25: qty 0.5
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
## 2020-09-25 NOTE — ED Provider Notes (Signed)
Procedures     ----------------------------------------- 9:26 PM on 09/25/2020 ----------------------------------------- Patient feeling better after IV fluids.  Vital signs improved.  Gave the patient his home amlodipine as well as 1 dose of IV labetalol, and blood pressure is much improved.  Will provide patient a refill of his Norvasc as well as he has run out at home.  CT imaging unremarkable.  Patient has chronic tremor.  Wife feels like he is at baseline, patient feels like he is at baseline and ready to go home.  He was able to ambulate without difficulty and without assistance.  Low suspicion for stroke at this point.  Offered admission for blood pressure management, patient declines.  He does note that he is planning to go see his PCP tomorrow.     Sharman Cheek, MD 09/25/20 2127
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
## 2020-09-25 NOTE — ED Notes (Signed)
Gave patient a urinal all 4 p's addressed

## 2023-01-12 IMAGING — CT CT MAXILLOFACIAL W/O CM
3 series · 14 of 47 positions shown, 16 images · non-contrast
Comparison: CT brain 09/22/2015

CLINICAL DATA: Head trauma laceration

EXAM:
CT HEAD WITHOUT CONTRAST
CT MAXILLOFACIAL WITHOUT CONTRAST
CT CERVICAL SPINE WITHOUT CONTRAST
TECHNIQUE: Multidetector CT imaging of the head, cervical spine, and
maxillofacial structures were performed using the standard protocol
without intravenous contrast. Multiplanar CT image reconstructions
of the cervical spine and maxillofacial structures were also
generated.

[Series 2: max soft · axial · 0.40mm/px · z∈[+353,+503]mm · 8 of 88 slices shown, 10 images]
[im 7/88  brain]
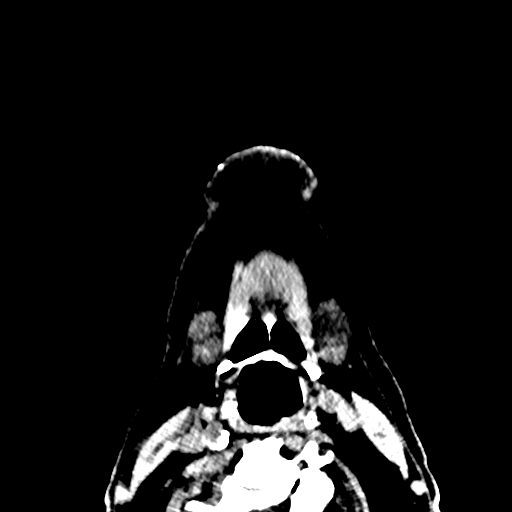
[im 7/88  bone]
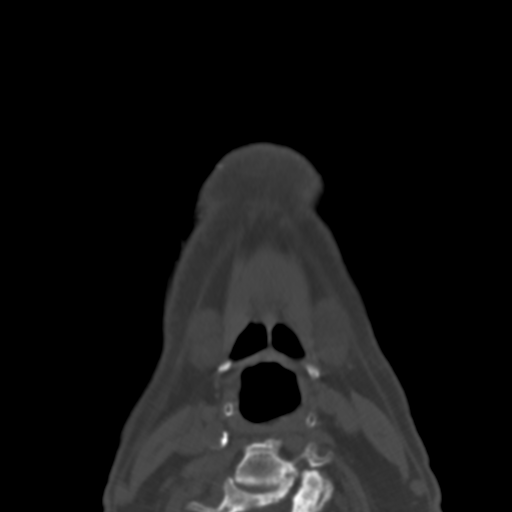
[im 19/88  bone]
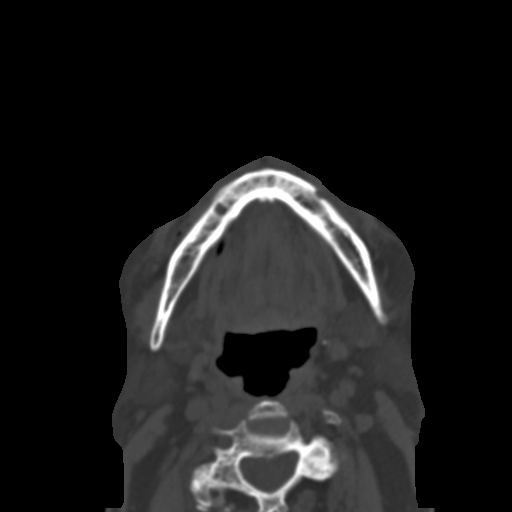
[im 28/88  bone]
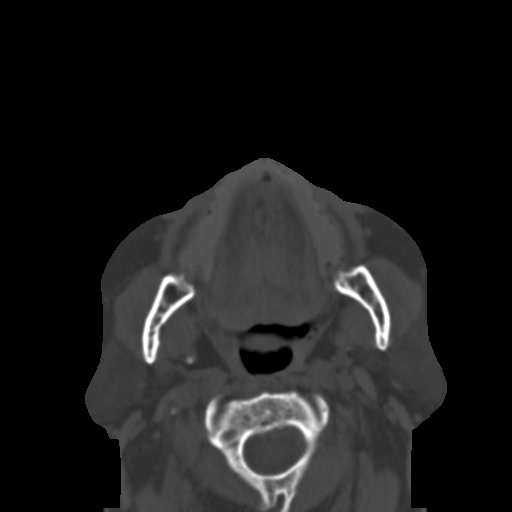
[im 40/88  bone]
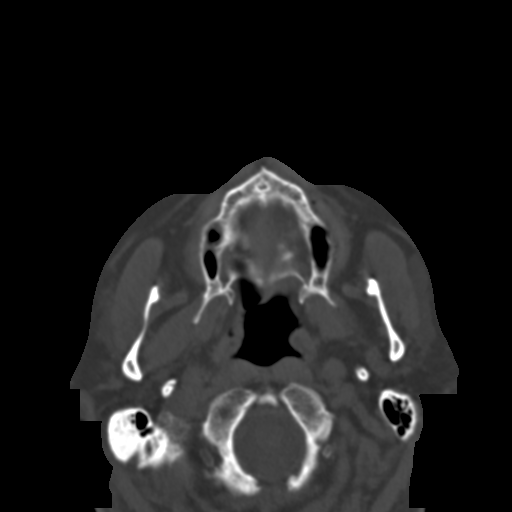
[im 49/88  brain]
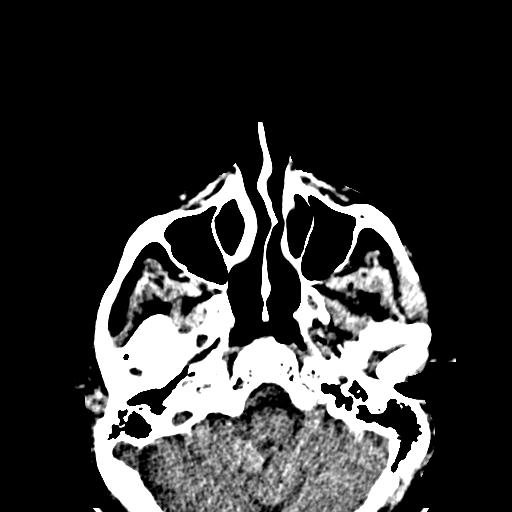
[im 49/88  bone]
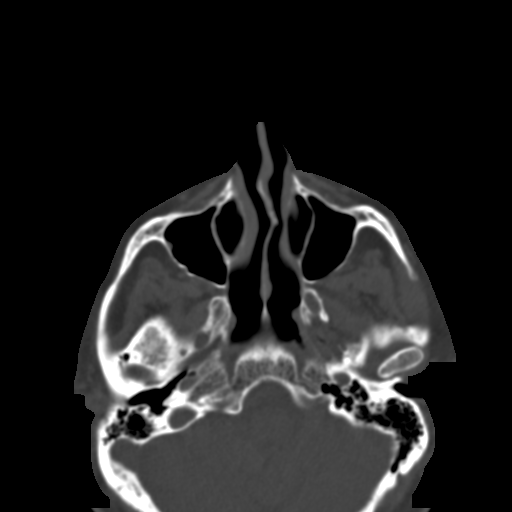
[im 61/88  bone]
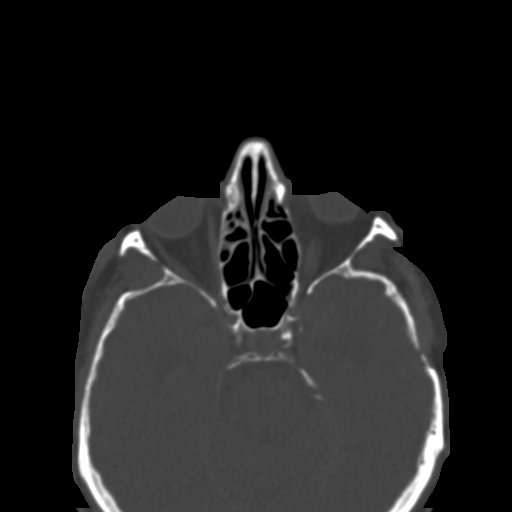
[im 70/88  bone]
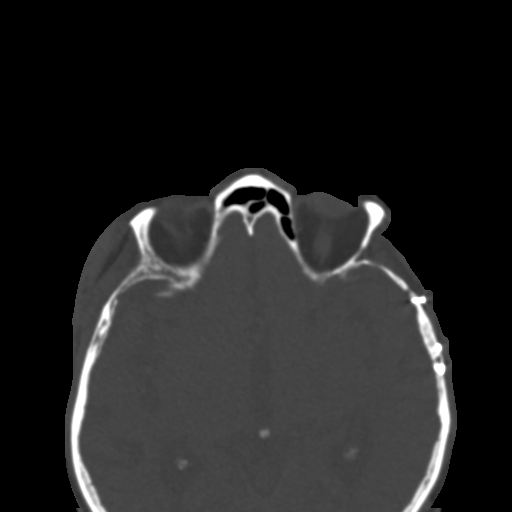
[im 82/88  bone]
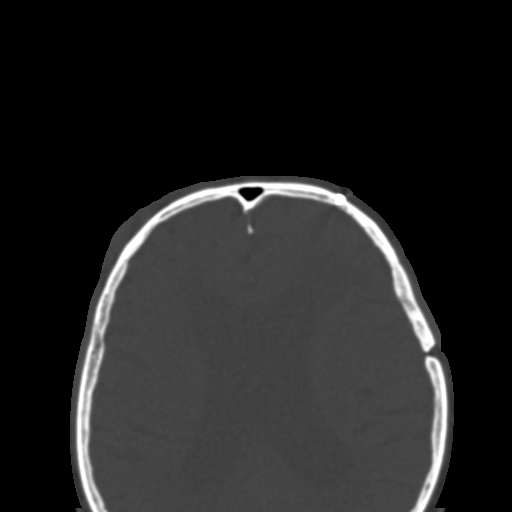

[Series 6: coronal soft · coronal · 0.35mm/px · 3 of 70 slices shown]
[im 24/70  bone]
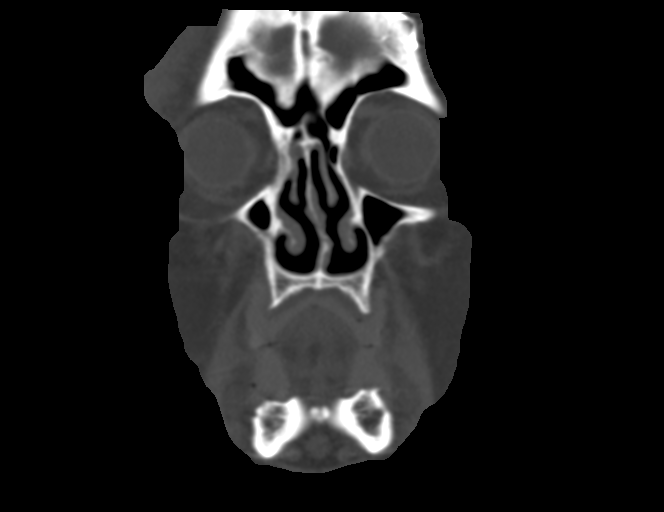
[im 31/70  bone]
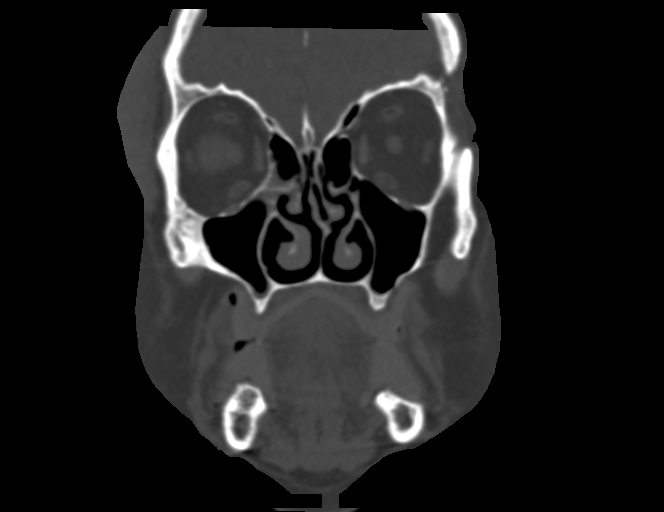
[im 39/70  bone]
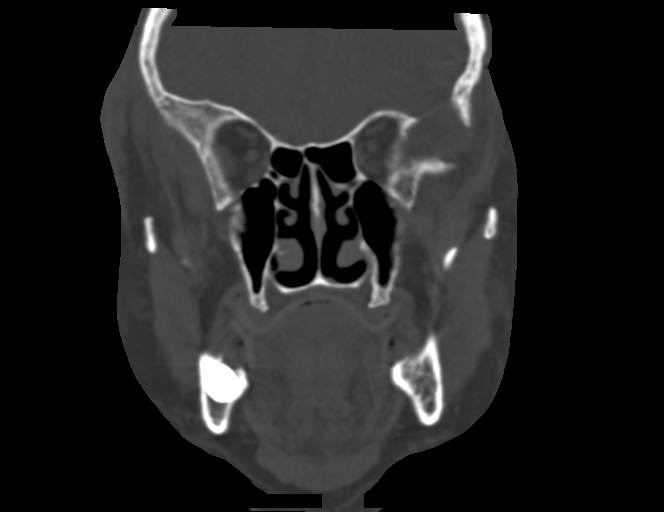

[Series 8: sagittal soft · sagittal · 0.27mm/px · 3 of 85 slices shown]
[im 29/85  bone]
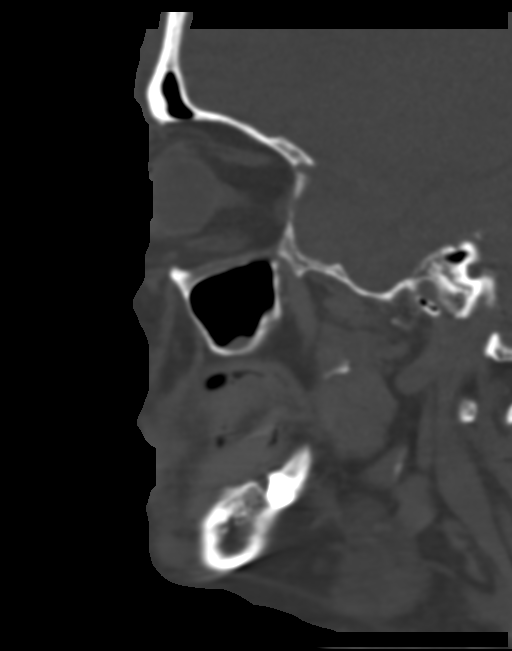
[im 43/85  bone]
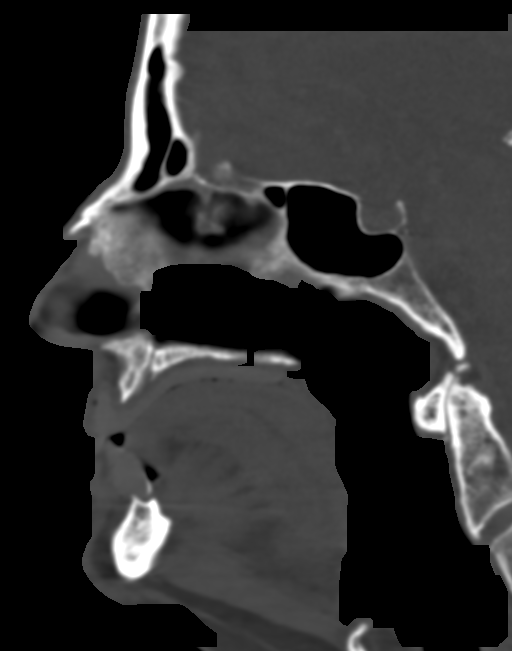
[im 57/85  bone]
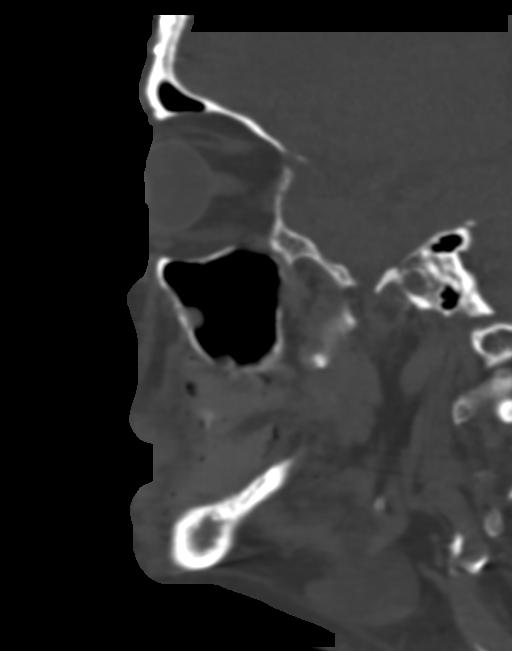

[14 of 47 positions shown; findings below may reference images not displayed]

FINDINGS: CT HEAD FINDINGS

Brain: No acute territorial infarction, hemorrhage or intracranial
mass. Small chronic infarct in the right cerebellum.
Encephalomalacia within the left temporal and frontal lobes. Mild
atrophy. Fairly extensive white matter hypodensity likely chronic
small vessel ischemic change. Chronic appearing lacunar infarct
within the right thalamus. Stable ventricle size.

Vascular: No hyperdense vessels.  Carotid vascular calcification

Skull: Prior left frontotemporal craniotomy. No depressed skull
fracture.

Other: Large right forehead and periorbital laceration and hematoma

CT MAXILLOFACIAL FINDINGS

Osseous: Mandibular heads are normally position. No mandibular
fracture. Pterygoid plates and zygomatic arches appear intact. Age
indeterminate nasal bone deformity.

Orbits: Negative. No traumatic or inflammatory finding.

Sinuses: Mild mucosal thickening in the sinuses. No acute fluid
level or acute sinus wall fracture.

Soft tissues: Considerable soft tissue swelling and hematoma at the
right forehead and lateral orbital region.

CT CERVICAL SPINE FINDINGS

Alignment: No subluxation.  Facet alignment within normal limits.

Skull base and vertebrae: No acute fracture. No primary bone lesion
or focal pathologic process.

Soft tissues and spinal canal: No prevertebral fluid or swelling. No
visible canal hematoma.

Disc levels: Multiple level degenerative change. Moderate severe
disc space narrowing and degenerative change C5-C6, C6-C7 and C7-T1.
Hypertrophic facet degenerative changes at multiple levels with
foraminal narrowing.

Upper chest: Negative.

Other: None
IMPRESSION: 1. No CT evidence for acute intracranial abnormality. Atrophy and
chronic small vessel ischemic changes of the white matter.
Encephalomalacia within the left temporal and frontal lobes. Prior
left frontotemporal craniotomy.
2. Considerable soft tissue swelling and hematoma at the right
forehead and lateral orbital region. Age indeterminate nasal bone
deformity. No other definite acute osseous abnormality of the
maxillofacial bones.
3. Degenerative changes of the cervical spine. No acute osseous
abnormality.

## 2023-01-12 IMAGING — CT CT HEAD W/O CM
3 series · 14 of 47 positions shown, 16 images · non-contrast
Comparison: CT brain 09/22/2015

CLINICAL DATA: Head trauma laceration

EXAM:
CT HEAD WITHOUT CONTRAST
CT MAXILLOFACIAL WITHOUT CONTRAST
CT CERVICAL SPINE WITHOUT CONTRAST
TECHNIQUE: Multidetector CT imaging of the head, cervical spine, and
maxillofacial structures were performed using the standard protocol
without intravenous contrast. Multiplanar CT image reconstructions
of the cervical spine and maxillofacial structures were also
generated.

[Series 3: head wo · axial · 0.39mm/px · z∈[+424,+549]mm · 8 of 30 slices shown, 10 images]
[im 3/30  brain]
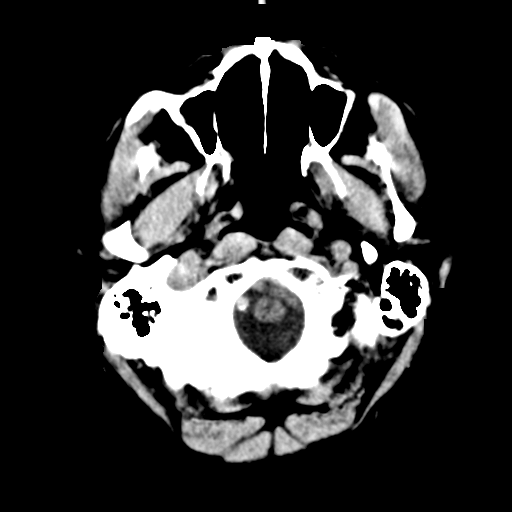
[im 3/30  bone]
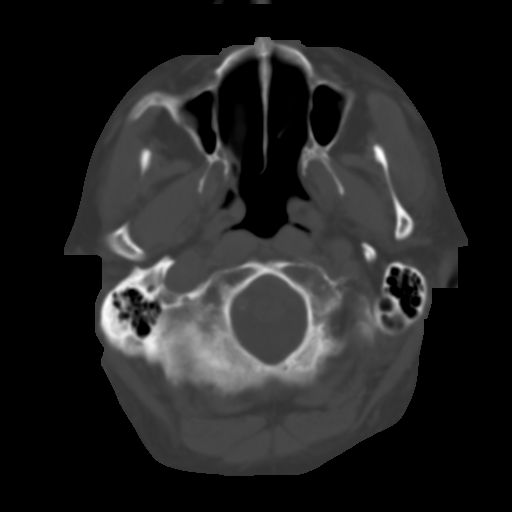
[im 7/30  brain]
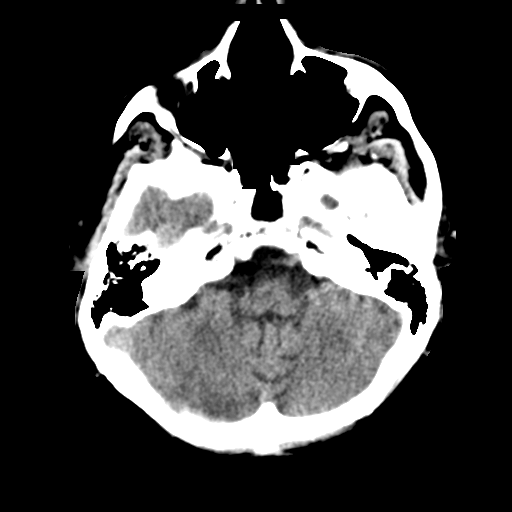
[im 10/30  brain]
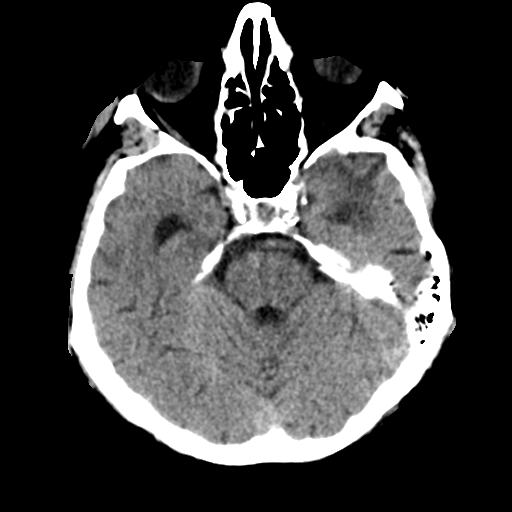
[im 14/30  brain]
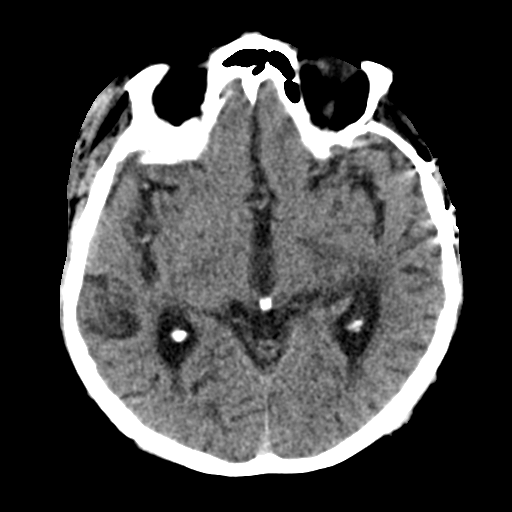
[im 17/30  brain]
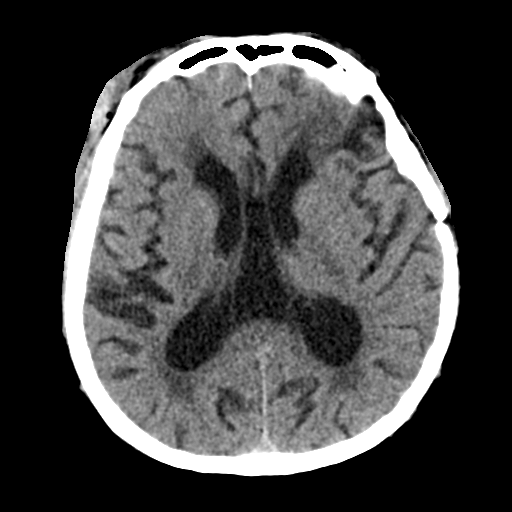
[im 17/30  bone]
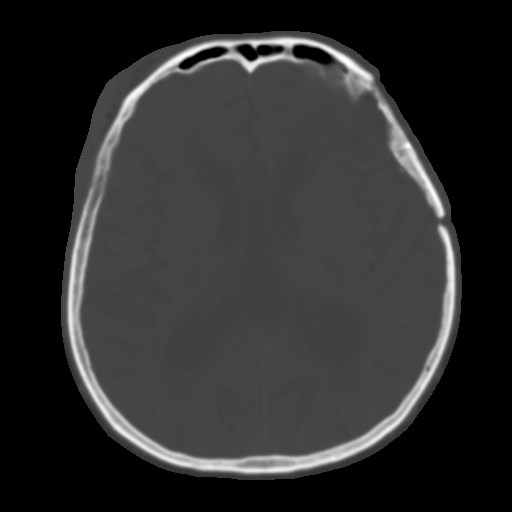
[im 21/30  brain]
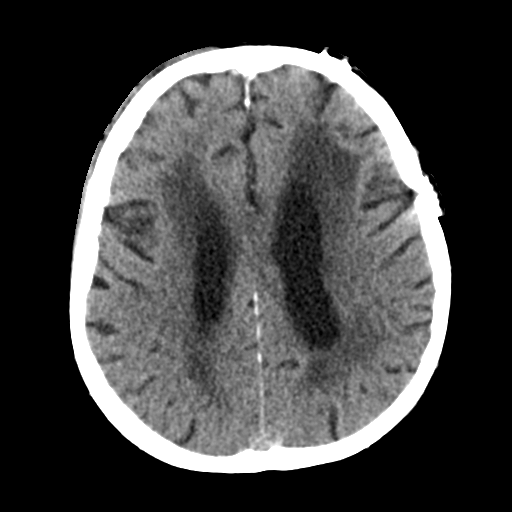
[im 24/30  brain]
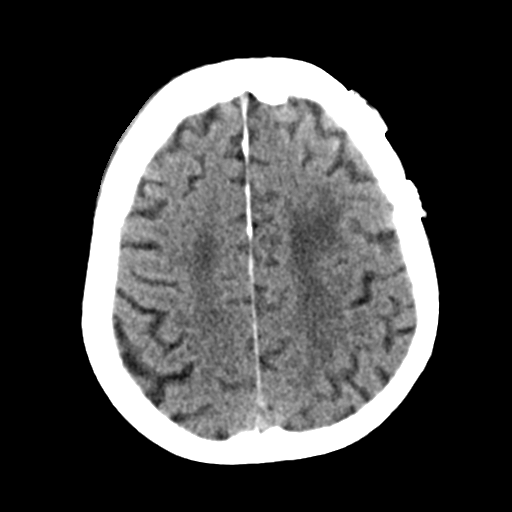
[im 28/30  brain]
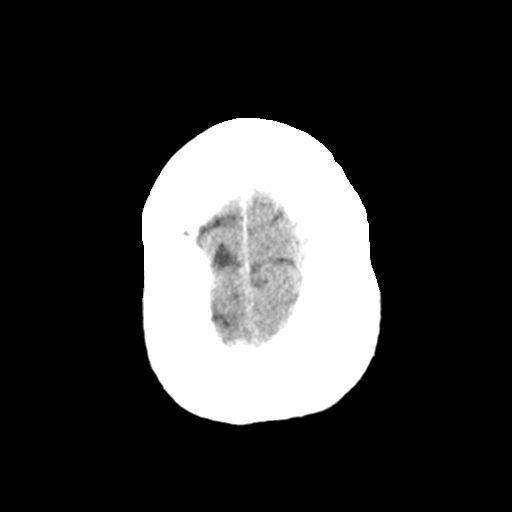

[Series 4: coronal soft tissue · coronal · 0.31mm/px · 3 of 69 slices shown]
[im 23/69  brain]
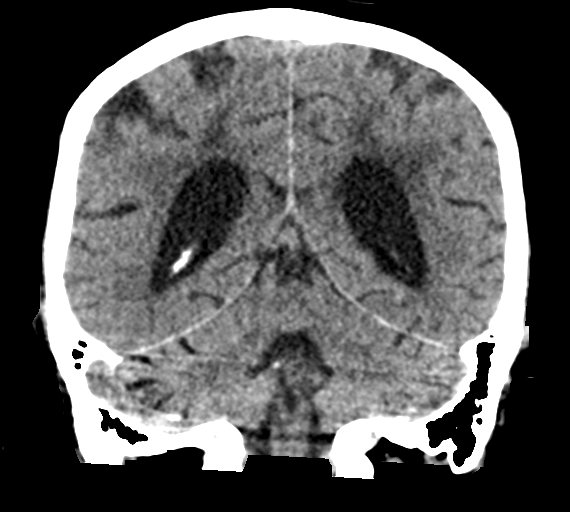
[im 31/69  brain]
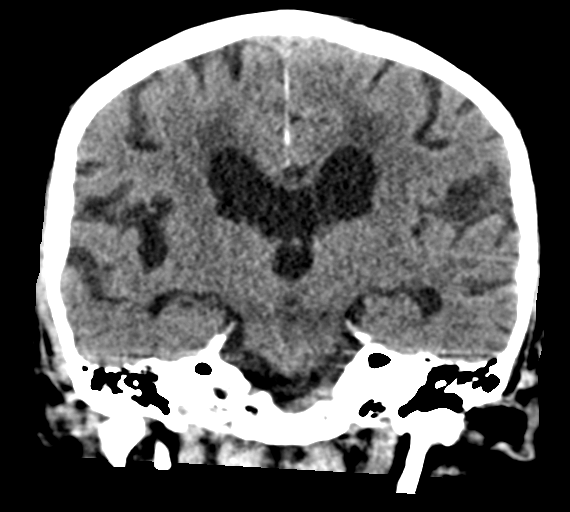
[im 38/69  brain]
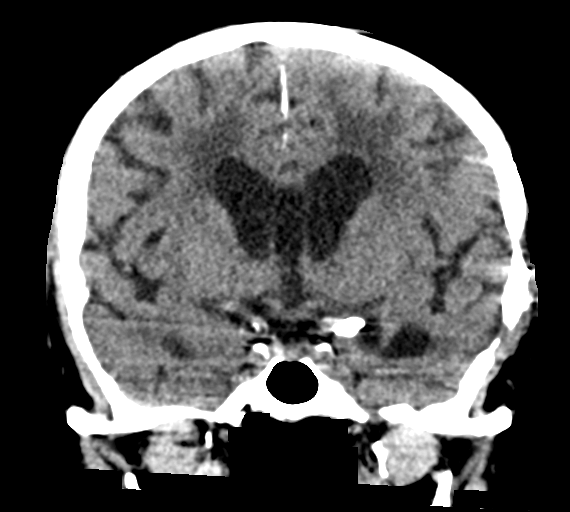

[Series 5: sagittal soft tissue · sagittal · 0.31mm/px · 3 of 60 slices shown]
[im 20/60  brain]
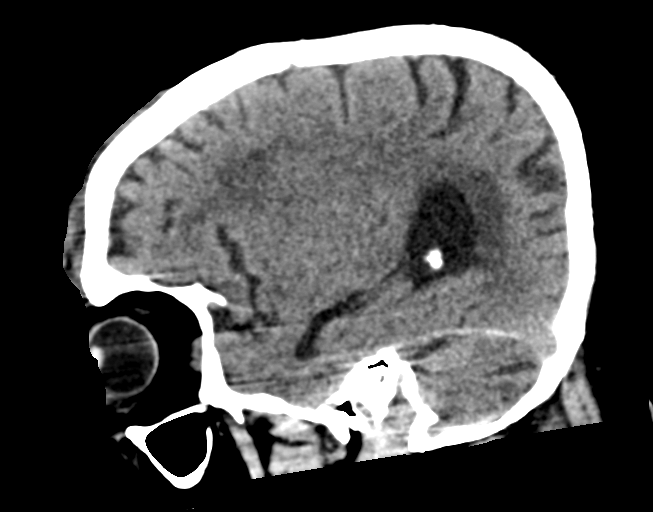
[im 30/60  brain]
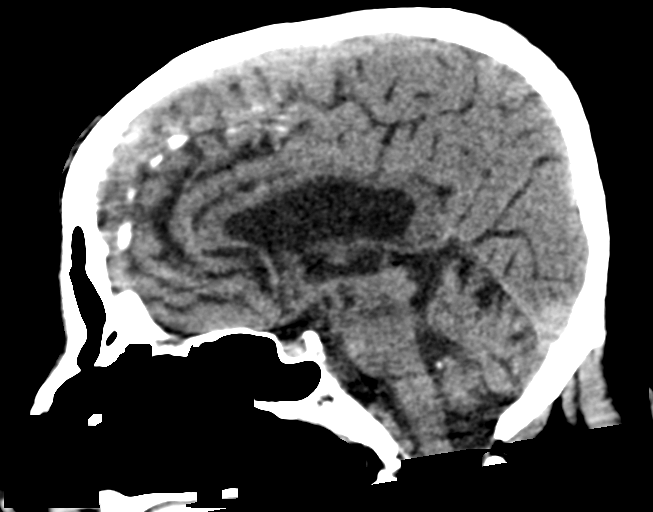
[im 40/60  brain]
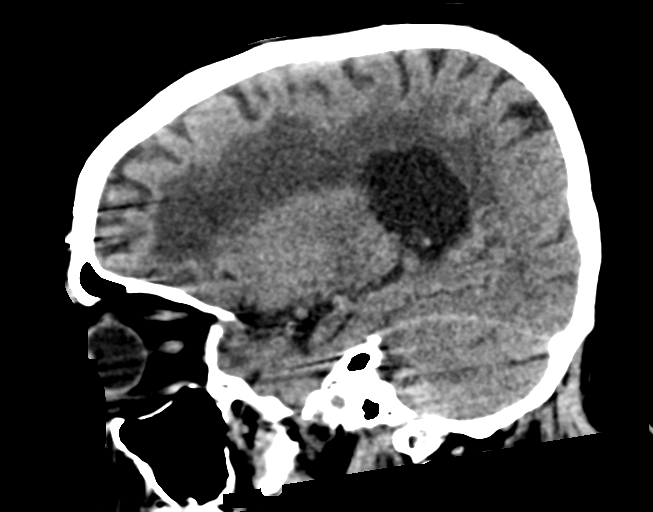

[14 of 47 positions shown; findings below may reference images not displayed]

FINDINGS: CT HEAD FINDINGS

Brain: No acute territorial infarction, hemorrhage or intracranial
mass. Small chronic infarct in the right cerebellum.
Encephalomalacia within the left temporal and frontal lobes. Mild
atrophy. Fairly extensive white matter hypodensity likely chronic
small vessel ischemic change. Chronic appearing lacunar infarct
within the right thalamus. Stable ventricle size.

Vascular: No hyperdense vessels.  Carotid vascular calcification

Skull: Prior left frontotemporal craniotomy. No depressed skull
fracture.

Other: Large right forehead and periorbital laceration and hematoma

CT MAXILLOFACIAL FINDINGS

Osseous: Mandibular heads are normally position. No mandibular
fracture. Pterygoid plates and zygomatic arches appear intact. Age
indeterminate nasal bone deformity.

Orbits: Negative. No traumatic or inflammatory finding.

Sinuses: Mild mucosal thickening in the sinuses. No acute fluid
level or acute sinus wall fracture.

Soft tissues: Considerable soft tissue swelling and hematoma at the
right forehead and lateral orbital region.

CT CERVICAL SPINE FINDINGS

Alignment: No subluxation.  Facet alignment within normal limits.

Skull base and vertebrae: No acute fracture. No primary bone lesion
or focal pathologic process.

Soft tissues and spinal canal: No prevertebral fluid or swelling. No
visible canal hematoma.

Disc levels: Multiple level degenerative change. Moderate severe
disc space narrowing and degenerative change C5-C6, C6-C7 and C7-T1.
Hypertrophic facet degenerative changes at multiple levels with
foraminal narrowing.

Upper chest: Negative.

Other: None
IMPRESSION: 1. No CT evidence for acute intracranial abnormality. Atrophy and
chronic small vessel ischemic changes of the white matter.
Encephalomalacia within the left temporal and frontal lobes. Prior
left frontotemporal craniotomy.
2. Considerable soft tissue swelling and hematoma at the right
forehead and lateral orbital region. Age indeterminate nasal bone
deformity. No other definite acute osseous abnormality of the
maxillofacial bones.
3. Degenerative changes of the cervical spine. No acute osseous
abnormality.

## 2023-01-12 IMAGING — CT CT CERVICAL SPINE W/O CM
2 series · 12 of 27 positions shown, 15 images · non-contrast
Comparison: CT brain 09/22/2015

CLINICAL DATA: Head trauma laceration

EXAM:
CT HEAD WITHOUT CONTRAST
CT MAXILLOFACIAL WITHOUT CONTRAST
CT CERVICAL SPINE WITHOUT CONTRAST
TECHNIQUE: Multidetector CT imaging of the head, cervical spine, and
maxillofacial structures were performed using the standard protocol
without intravenous contrast. Multiplanar CT image reconstructions
of the cervical spine and maxillofacial structures were also
generated.

[Series 3: c spine soft · axial · 0.35mm/px · z∈[+305,+423]mm · 7 of 71 slices shown, 9 images]
[im 6/71  soft-tissue]
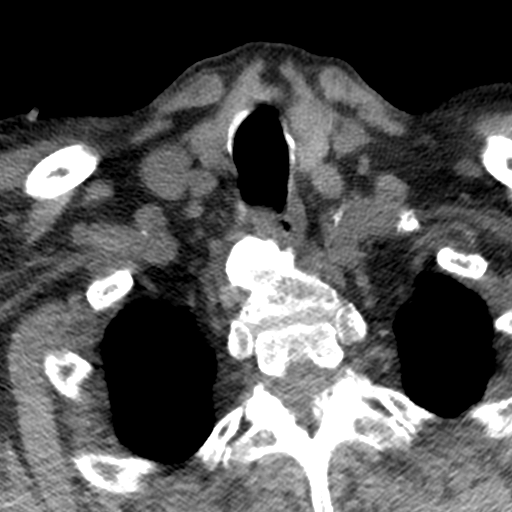
[im 6/71  bone]
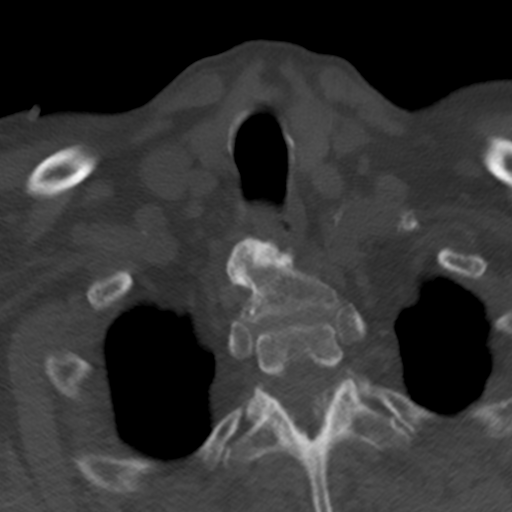
[im 17/71  bone]
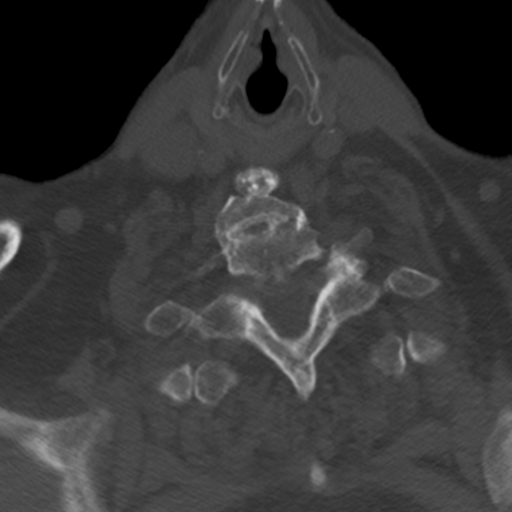
[im 27/71  bone]
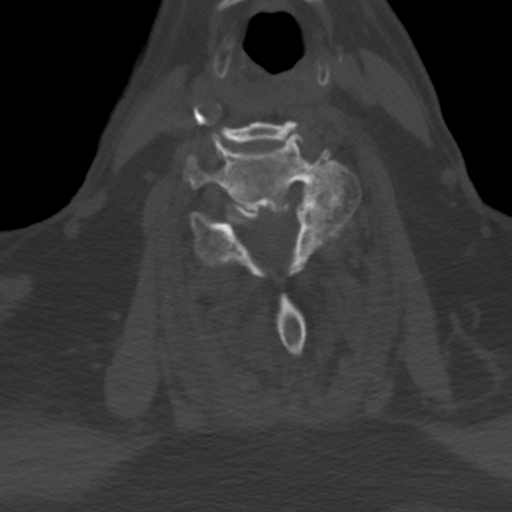
[im 38/71  bone]
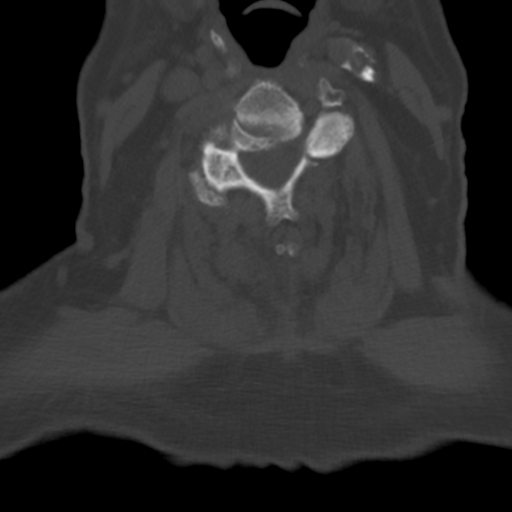
[im 44/71  soft-tissue]
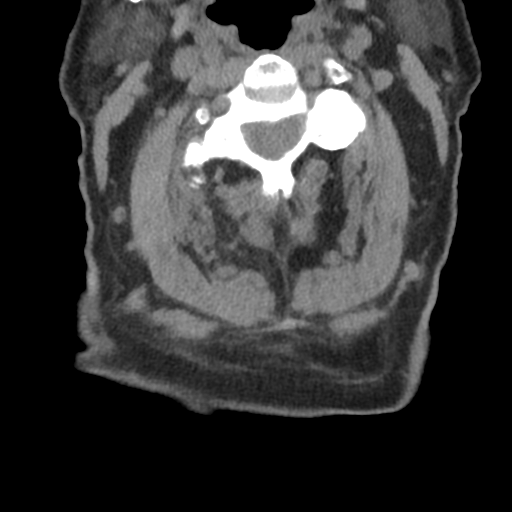
[im 44/71  bone]
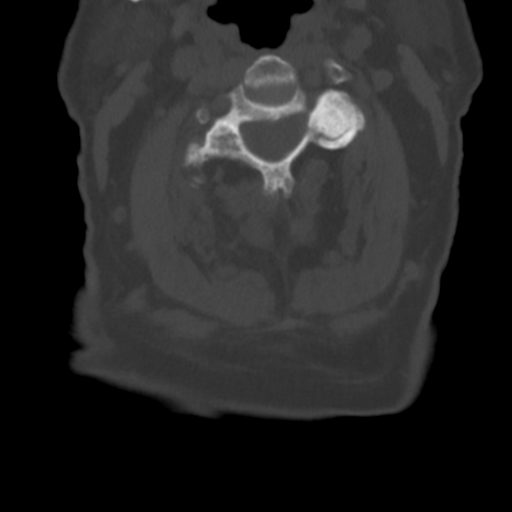
[im 54/71  bone]
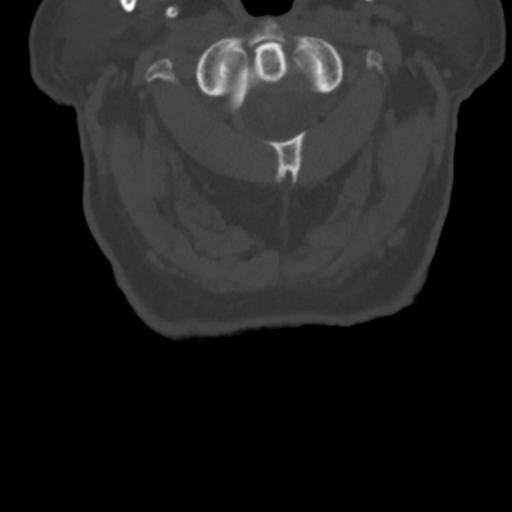
[im 65/71  bone]
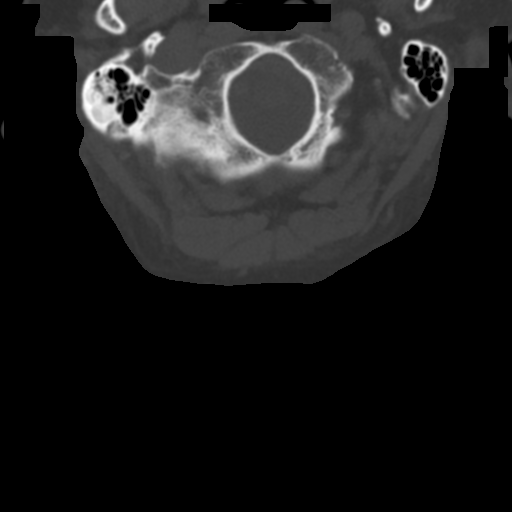

[Series 5: sagittal bone · sagittal · 0.24mm/px · 5 of 85 slices shown, 6 images]
[im 29/85  bone]
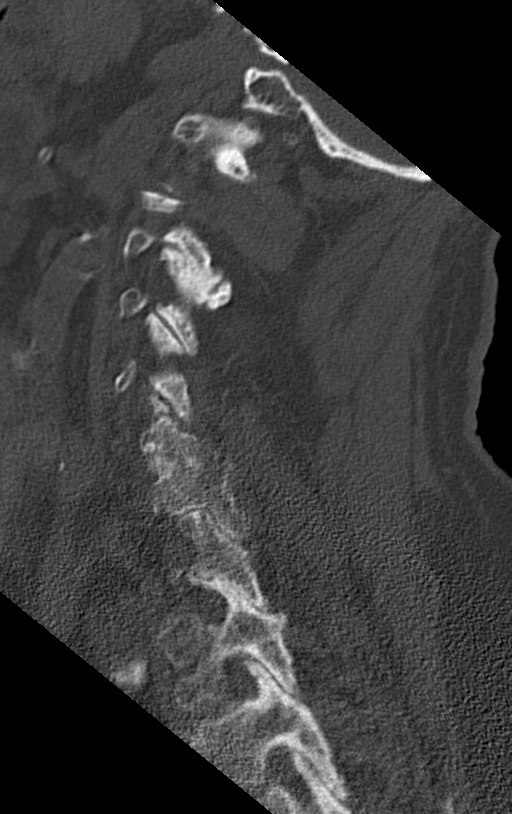
[im 36/85  bone]
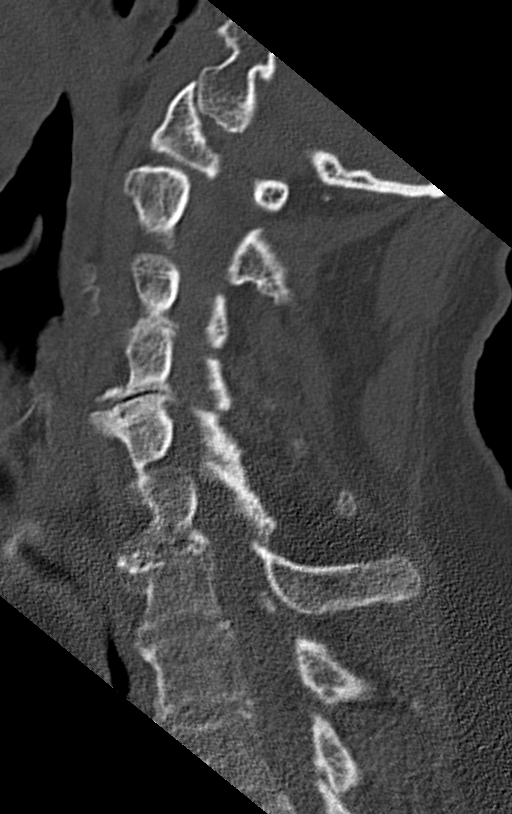
[im 43/85  soft-tissue]
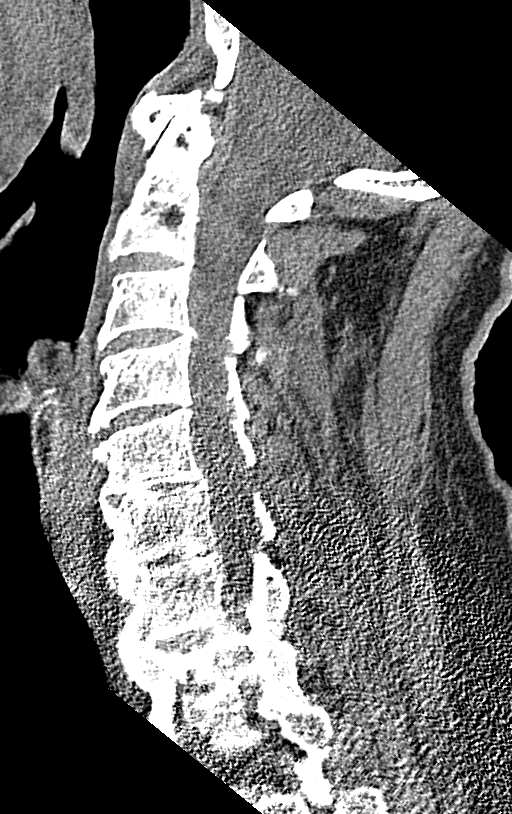
[im 43/85  bone]
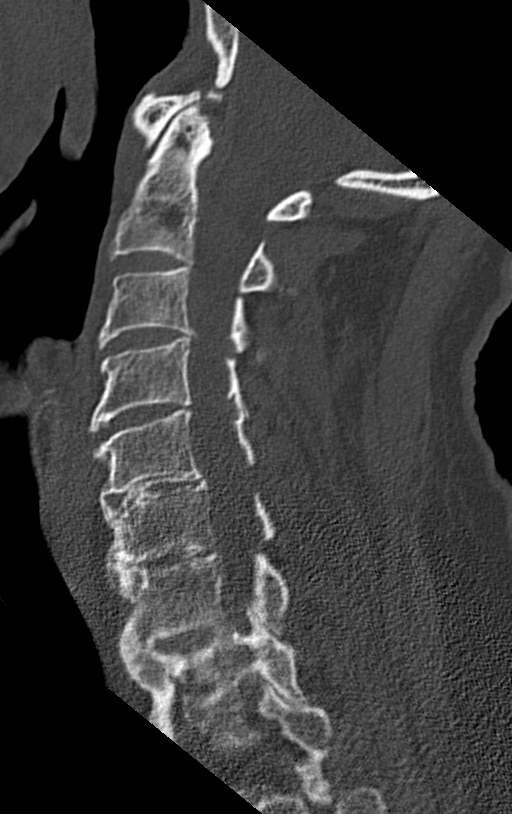
[im 50/85  bone]
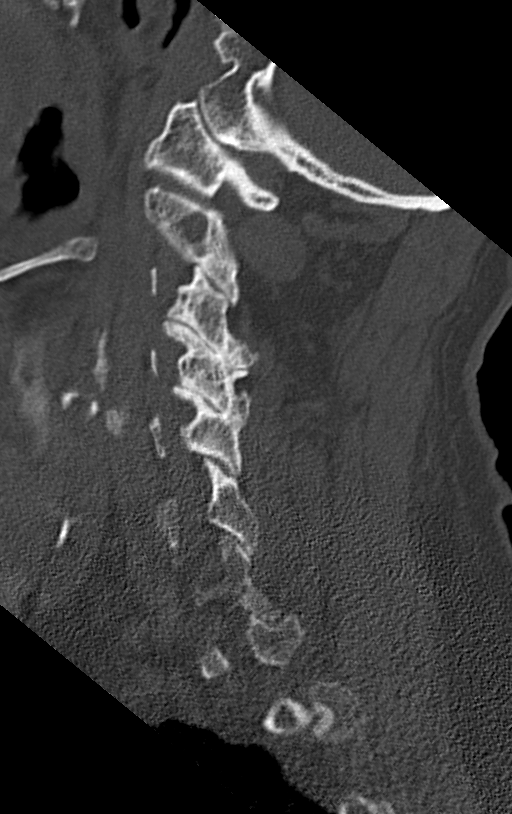
[im 57/85  bone]
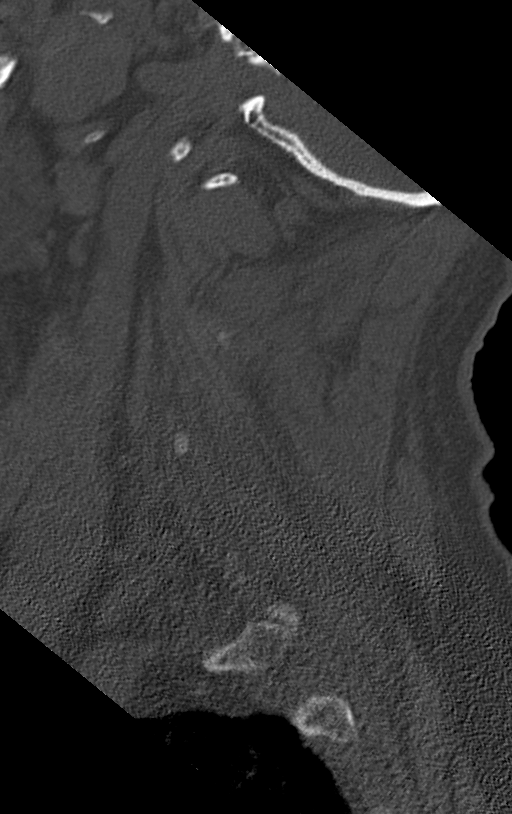

[12 of 27 positions shown; findings below may reference images not displayed]

FINDINGS: CT HEAD FINDINGS

Brain: No acute territorial infarction, hemorrhage or intracranial
mass. Small chronic infarct in the right cerebellum.
Encephalomalacia within the left temporal and frontal lobes. Mild
atrophy. Fairly extensive white matter hypodensity likely chronic
small vessel ischemic change. Chronic appearing lacunar infarct
within the right thalamus. Stable ventricle size.

Vascular: No hyperdense vessels.  Carotid vascular calcification

Skull: Prior left frontotemporal craniotomy. No depressed skull
fracture.

Other: Large right forehead and periorbital laceration and hematoma

CT MAXILLOFACIAL FINDINGS

Osseous: Mandibular heads are normally position. No mandibular
fracture. Pterygoid plates and zygomatic arches appear intact. Age
indeterminate nasal bone deformity.

Orbits: Negative. No traumatic or inflammatory finding.

Sinuses: Mild mucosal thickening in the sinuses. No acute fluid
level or acute sinus wall fracture.

Soft tissues: Considerable soft tissue swelling and hematoma at the
right forehead and lateral orbital region.

CT CERVICAL SPINE FINDINGS

Alignment: No subluxation.  Facet alignment within normal limits.

Skull base and vertebrae: No acute fracture. No primary bone lesion
or focal pathologic process.

Soft tissues and spinal canal: No prevertebral fluid or swelling. No
visible canal hematoma.

Disc levels: Multiple level degenerative change. Moderate severe
disc space narrowing and degenerative change C5-C6, C6-C7 and C7-T1.
Hypertrophic facet degenerative changes at multiple levels with
foraminal narrowing.

Upper chest: Negative.

Other: None
IMPRESSION: 1. No CT evidence for acute intracranial abnormality. Atrophy and
chronic small vessel ischemic changes of the white matter.
Encephalomalacia within the left temporal and frontal lobes. Prior
left frontotemporal craniotomy.
2. Considerable soft tissue swelling and hematoma at the right
forehead and lateral orbital region. Age indeterminate nasal bone
deformity. No other definite acute osseous abnormality of the
maxillofacial bones.
3. Degenerative changes of the cervical spine. No acute osseous
abnormality.
